# Patient Record
Sex: Male | Born: 1940 | Race: White | Hispanic: No | Marital: Married | Smoking: Former smoker
Health system: Southern US, Community
[De-identification: ages and names within clinical notes are randomized; demographics above are authoritative.]

## PROBLEM LIST (undated history)

## (undated) DIAGNOSIS — K219 Gastro-esophageal reflux disease without esophagitis: Secondary | ICD-10-CM

## (undated) DIAGNOSIS — C44629 Squamous cell carcinoma of skin of left upper limb, including shoulder: Secondary | ICD-10-CM

## (undated) DIAGNOSIS — E785 Hyperlipidemia, unspecified: Secondary | ICD-10-CM

## (undated) DIAGNOSIS — E119 Type 2 diabetes mellitus without complications: Secondary | ICD-10-CM

## (undated) DIAGNOSIS — R945 Abnormal results of liver function studies: Secondary | ICD-10-CM

## (undated) DIAGNOSIS — I10 Essential (primary) hypertension: Secondary | ICD-10-CM

## (undated) DIAGNOSIS — I219 Acute myocardial infarction, unspecified: Secondary | ICD-10-CM

## (undated) DIAGNOSIS — M1990 Unspecified osteoarthritis, unspecified site: Secondary | ICD-10-CM

## (undated) DIAGNOSIS — I2510 Atherosclerotic heart disease of native coronary artery without angina pectoris: Secondary | ICD-10-CM

## (undated) HISTORY — DX: Squamous cell carcinoma of skin of left upper limb, including shoulder: C44.629

## (undated) HISTORY — DX: Abnormal results of liver function studies: R94.5

## (undated) HISTORY — PX: COLONOSCOPY: SHX174

## (undated) HISTORY — PX: TIBIA FRACTURE SURGERY: SHX806

## (undated) HISTORY — PX: ANKLE SURGERY: SHX546

## (undated) HISTORY — DX: Gastro-esophageal reflux disease without esophagitis: K21.9

## (undated) HISTORY — DX: Atherosclerotic heart disease of native coronary artery without angina pectoris: I25.10

---

## 1995-11-02 HISTORY — PX: CORONARY ARTERY BYPASS GRAFT: SHX141

## 2002-01-30 ENCOUNTER — Ambulatory Visit (HOSPITAL_COMMUNITY): Admission: RE | Admit: 2002-01-30 | Discharge: 2002-01-30 | Payer: Self-pay | Admitting: Cardiovascular Disease

## 2009-02-17 ENCOUNTER — Encounter: Payer: Self-pay | Admitting: Cardiovascular Disease

## 2009-02-17 ENCOUNTER — Ambulatory Visit: Payer: Self-pay | Admitting: Cardiovascular Disease

## 2009-02-17 DIAGNOSIS — E119 Type 2 diabetes mellitus without complications: Secondary | ICD-10-CM | POA: Insufficient documentation

## 2009-02-17 DIAGNOSIS — Z951 Presence of aortocoronary bypass graft: Secondary | ICD-10-CM | POA: Insufficient documentation

## 2009-02-17 DIAGNOSIS — I119 Hypertensive heart disease without heart failure: Secondary | ICD-10-CM | POA: Insufficient documentation

## 2009-02-17 DIAGNOSIS — R0602 Shortness of breath: Secondary | ICD-10-CM | POA: Insufficient documentation

## 2009-02-17 DIAGNOSIS — E782 Mixed hyperlipidemia: Secondary | ICD-10-CM | POA: Insufficient documentation

## 2009-02-17 DIAGNOSIS — G609 Hereditary and idiopathic neuropathy, unspecified: Secondary | ICD-10-CM | POA: Insufficient documentation

## 2009-02-17 DIAGNOSIS — I10 Essential (primary) hypertension: Secondary | ICD-10-CM | POA: Insufficient documentation

## 2009-02-24 ENCOUNTER — Telehealth (INDEPENDENT_AMBULATORY_CARE_PROVIDER_SITE_OTHER): Payer: Self-pay | Admitting: *Deleted

## 2009-02-25 ENCOUNTER — Encounter: Payer: Self-pay | Admitting: Cardiovascular Disease

## 2009-02-25 ENCOUNTER — Ambulatory Visit: Payer: Self-pay

## 2009-03-06 ENCOUNTER — Telehealth: Payer: Self-pay | Admitting: Cardiovascular Disease

## 2009-12-31 ENCOUNTER — Encounter (INDEPENDENT_AMBULATORY_CARE_PROVIDER_SITE_OTHER): Payer: Self-pay | Admitting: *Deleted

## 2010-11-23 ENCOUNTER — Encounter: Payer: Self-pay | Admitting: Cardiovascular Disease

## 2010-12-01 NOTE — Letter (Signed)
Summary: Appointment - Reminder 2  [employer], Main Office  [address]   [place], [place] [address]   Phone: [phone]  Fax: [phone]     December 31, 2009 MRN: [id number]   [name] [address] [facility] [address] [place], [place]  [address]   Dear Andre Shaffer,  Our records indicate that it is time to schedule a follow-up appointment with Dr. Eden Emms. It is very important that we reach you to schedule this appointment. We look forward to participating in your health care needs. Please contact us at the number listed above at your earliest convenience to schedule your appointment.  If you are unable to make an appointment at this time, give [place] a call so we can update our records.     Sincerely,   Migdalia Dk [facility] Scheduling Team

## 2012-10-13 ENCOUNTER — Encounter (HOSPITAL_COMMUNITY): Payer: Self-pay | Admitting: Emergency Medicine

## 2012-10-13 ENCOUNTER — Observation Stay (HOSPITAL_COMMUNITY)
Admission: EM | Admit: 2012-10-13 | Discharge: 2012-10-15 | Disposition: A | Discharge status: Home / Self Care | Payer: Medicare Other | Attending: Internal Medicine | Admitting: Internal Medicine

## 2012-10-13 DIAGNOSIS — Z951 Presence of aortocoronary bypass graft: Secondary | ICD-10-CM

## 2012-10-13 DIAGNOSIS — K8050 Calculus of bile duct without cholangitis or cholecystitis without obstruction: Secondary | ICD-10-CM

## 2012-10-13 DIAGNOSIS — I119 Hypertensive heart disease without heart failure: Secondary | ICD-10-CM | POA: Diagnosis present

## 2012-10-13 DIAGNOSIS — K8020 Calculus of gallbladder without cholecystitis without obstruction: Principal | ICD-10-CM

## 2012-10-13 DIAGNOSIS — G609 Hereditary and idiopathic neuropathy, unspecified: Secondary | ICD-10-CM

## 2012-10-13 DIAGNOSIS — R0602 Shortness of breath: Secondary | ICD-10-CM

## 2012-10-13 DIAGNOSIS — E119 Type 2 diabetes mellitus without complications: Secondary | ICD-10-CM

## 2012-10-13 DIAGNOSIS — Z79899 Other long term (current) drug therapy: Secondary | ICD-10-CM | POA: Insufficient documentation

## 2012-10-13 DIAGNOSIS — E782 Mixed hyperlipidemia: Secondary | ICD-10-CM

## 2012-10-13 DIAGNOSIS — I10 Essential (primary) hypertension: Secondary | ICD-10-CM

## 2012-10-13 DIAGNOSIS — R7989 Other specified abnormal findings of blood chemistry: Secondary | ICD-10-CM

## 2012-10-13 HISTORY — DX: Type 2 diabetes mellitus without complications: E11.9

## 2012-10-13 HISTORY — DX: Acute myocardial infarction, unspecified: I21.9

## 2012-10-13 HISTORY — DX: Essential (primary) hypertension: I10

## 2012-10-13 LAB — CBC WITH DIFFERENTIAL/PLATELET
Basophils Absolute: 0 10*3/uL (ref 0.0–0.1)
Basophils Relative: 0 % (ref 0–1)
Eosinophils Absolute: 0.1 10*3/uL (ref 0.0–0.7)
Eosinophils Relative: 1 % (ref 0–5)
HCT: 42.4 % (ref 39.0–52.0)
Lymphocytes Relative: 15 % (ref 12–46)
MCHC: 35.6 g/dL (ref 30.0–36.0)
MCV: 89.6 fL (ref 78.0–100.0)
Monocytes Absolute: 0.7 10*3/uL (ref 0.1–1.0)
Platelets: 142 10*3/uL — ABNORMAL LOW (ref 150–400)
RDW: 13.4 % (ref 11.5–15.5)
WBC: 8.5 10*3/uL (ref 4.0–10.5)

## 2012-10-13 LAB — COMPREHENSIVE METABOLIC PANEL
ALT: 237 U/L — ABNORMAL HIGH (ref 0–53)
AST: 195 U/L — ABNORMAL HIGH (ref 0–37)
Albumin: 3.5 g/dL (ref 3.5–5.2)
CO2: 22 mEq/L (ref 19–32)
Calcium: 10.2 mg/dL (ref 8.4–10.5)
Creatinine, Ser: 0.93 mg/dL (ref 0.50–1.35)
GFR calc non Af Amer: 81 mL/min — ABNORMAL LOW (ref >90)
Sodium: 135 mEq/L (ref 135–145)
Total Protein: 7.6 g/dL (ref 6.0–8.3)

## 2012-10-13 LAB — POCT I-STAT TROPONIN I

## 2012-10-13 LAB — GLUCOSE, CAPILLARY: Glucose-Capillary: 187 mg/dL — ABNORMAL HIGH (ref 70–99)

## 2012-10-13 LAB — URINALYSIS, MICROSCOPIC ONLY
Glucose, UA: 250 mg/dL — AB
Ketones, ur: 15 mg/dL — AB
Leukocytes, UA: NEGATIVE
Nitrite: NEGATIVE
Protein, ur: NEGATIVE mg/dL

## 2012-10-13 LAB — LIPASE, BLOOD: Lipase: 17 U/L (ref 11–59)

## 2012-10-13 NOTE — ED Notes (Addendum)
Reports intermittent fever over the last 3 weeks with very mild intermittent abd pain above umbilicus.  Denies pain or any other symptoms at present.  Pt states he had CT scan at [facility] today and was sent to ED for gallstones. Denies nausea and vomiting.

## 2012-10-13 NOTE — ED Notes (Signed)
States that he  Had a sharp pain just above his umbilicus and nausea yesterday. Denies pain currently.

## 2012-10-14 ENCOUNTER — Emergency Department (HOSPITAL_COMMUNITY): Payer: Medicare Other

## 2012-10-14 ENCOUNTER — Encounter (HOSPITAL_COMMUNITY): Payer: Self-pay | Admitting: Internal Medicine

## 2012-10-14 ENCOUNTER — Observation Stay (HOSPITAL_COMMUNITY): Payer: Medicare Other

## 2012-10-14 DIAGNOSIS — R7989 Other specified abnormal findings of blood chemistry: Secondary | ICD-10-CM | POA: Diagnosis present

## 2012-10-14 DIAGNOSIS — K8020 Calculus of gallbladder without cholecystitis without obstruction: Secondary | ICD-10-CM

## 2012-10-14 DIAGNOSIS — I10 Essential (primary) hypertension: Secondary | ICD-10-CM

## 2012-10-14 DIAGNOSIS — R6889 Other general symptoms and signs: Secondary | ICD-10-CM

## 2012-10-14 LAB — GLUCOSE, CAPILLARY
Glucose-Capillary: 209 mg/dL — ABNORMAL HIGH (ref 70–99)
Glucose-Capillary: 215 mg/dL — ABNORMAL HIGH (ref 70–99)

## 2012-10-14 LAB — COMPREHENSIVE METABOLIC PANEL
ALT: 191 U/L — ABNORMAL HIGH (ref 0–53)
AST: 122 U/L — ABNORMAL HIGH (ref 0–37)
Albumin: 3.4 g/dL — ABNORMAL LOW (ref 3.5–5.2)
Alkaline Phosphatase: 447 U/L — ABNORMAL HIGH (ref 39–117)
BUN: 18 mg/dL (ref 6–23)
CO2: 24 mEq/L (ref 19–32)
Calcium: 9.9 mg/dL (ref 8.4–10.5)
Chloride: 101 mEq/L (ref 96–112)
Creatinine, Ser: 0.86 mg/dL (ref 0.50–1.35)
GFR calc Af Amer: >90 mL/min (ref >90)
GFR calc non Af Amer: 84 mL/min — ABNORMAL LOW (ref >90)
Glucose, Bld: 162 mg/dL — ABNORMAL HIGH (ref 70–99)
Potassium: 5 mEq/L (ref 3.5–5.1)
Sodium: 135 mEq/L (ref 135–145)
Total Bilirubin: 1.9 mg/dL — ABNORMAL HIGH (ref 0.3–1.2)
Total Protein: 7.5 g/dL (ref 6.0–8.3)

## 2012-10-14 LAB — HEPATIC FUNCTION PANEL
Albumin: 3.1 g/dL — ABNORMAL LOW (ref 3.5–5.2)
Alkaline Phosphatase: 430 U/L — ABNORMAL HIGH (ref 39–117)
Total Protein: 6.8 g/dL (ref 6.0–8.3)

## 2012-10-14 MED ORDER — DEXTROSE-NACL 5-0.9 % IV SOLN
INTRAVENOUS | Status: DC
  Administered 2012-10-14: 04:00:00 via INTRAVENOUS

## 2012-10-14 MED ORDER — ENOXAPARIN SODIUM 40 MG/0.4ML ~~LOC~~ SOLN
40.0000 mg | SUBCUTANEOUS | Status: DC
  Administered 2012-10-14 – 2012-10-15 (×2): 40 mg via SUBCUTANEOUS
  Filled 2012-10-14 (×2): qty 0.4

## 2012-10-14 MED ORDER — ATORVASTATIN CALCIUM 40 MG PO TABS
40.0000 mg | ORAL_TABLET | Freq: Every day | ORAL | Status: DC
  Filled 2012-10-14: qty 1

## 2012-10-14 MED ORDER — LOSARTAN POTASSIUM 25 MG PO TABS
25.0000 mg | ORAL_TABLET | Freq: Every day | ORAL | Status: DC
  Administered 2012-10-14 – 2012-10-15 (×2): 25 mg via ORAL
  Filled 2012-10-14 (×2): qty 1

## 2012-10-14 MED ORDER — ASPIRIN 325 MG PO TABS
325.0000 mg | ORAL_TABLET | Freq: Every day | ORAL | Status: DC
  Administered 2012-10-14 – 2012-10-15 (×2): 325 mg via ORAL
  Filled 2012-10-14 (×2): qty 1

## 2012-10-14 MED ORDER — INSULIN ASPART 100 UNIT/ML ~~LOC~~ SOLN
0.0000 [IU] | SUBCUTANEOUS | Status: DC
  Administered 2012-10-14 (×2): 5 [IU] via SUBCUTANEOUS
  Administered 2012-10-14: 3 [IU] via SUBCUTANEOUS

## 2012-10-14 MED ORDER — GABAPENTIN 300 MG PO CAPS
300.0000 mg | ORAL_CAPSULE | Freq: Every day | ORAL | Status: DC
  Administered 2012-10-14: 300 mg via ORAL
  Filled 2012-10-14 (×2): qty 1

## 2012-10-14 MED ORDER — GLIMEPIRIDE 4 MG PO TABS
4.0000 mg | ORAL_TABLET | Freq: Two times a day (BID) | ORAL | Status: DC
  Administered 2012-10-14 – 2012-10-15 (×2): 4 mg via ORAL
  Filled 2012-10-14 (×5): qty 1

## 2012-10-14 MED ORDER — GADOBENATE DIMEGLUMINE 529 MG/ML IV SOLN
20.0000 mL | Freq: Once | INTRAVENOUS | Status: AC | PRN
  Administered 2012-10-14: 20 mL via INTRAVENOUS

## 2012-10-14 MED ORDER — ONDANSETRON HCL 4 MG/2ML IJ SOLN: 4.0000 mg | Freq: Four times a day (QID) | INTRAMUSCULAR | Status: DC | PRN

## 2012-10-14 MED ORDER — ONDANSETRON HCL 4 MG PO TABS: 4.0000 mg | ORAL_TABLET | Freq: Four times a day (QID) | ORAL | Status: DC | PRN

## 2012-10-14 MED ORDER — METOPROLOL SUCCINATE ER 25 MG PO TB24
25.0000 mg | ORAL_TABLET | Freq: Every day | ORAL | Status: DC
  Administered 2012-10-14 – 2012-10-15 (×2): 25 mg via ORAL
  Filled 2012-10-14 (×2): qty 1

## 2012-10-14 MED ORDER — HYDROMORPHONE HCL PF 1 MG/ML IJ SOLN: 1.0000 mg | INTRAMUSCULAR | Status: DC | PRN

## 2012-10-14 MED ORDER — DEXTROSE-NACL 5-0.9 % IV SOLN
INTRAVENOUS | Status: DC
  Administered 2012-10-14: 10:00:00 via INTRAVENOUS

## 2012-10-14 MED ORDER — INSULIN ASPART 100 UNIT/ML ~~LOC~~ SOLN
0.0000 [IU] | Freq: Three times a day (TID) | SUBCUTANEOUS | Status: DC
  Administered 2012-10-14: 2 [IU] via SUBCUTANEOUS
  Administered 2012-10-15 (×2): 5 [IU] via SUBCUTANEOUS

## 2012-10-14 NOTE — Progress Notes (Signed)
UR completed 

## 2012-10-14 NOTE — Progress Notes (Addendum)
Patient admitted early this AM by Dr. Conley Rolls.  Await GI to see and possible D/C this PM--- requesting food.  U/S show gallstones only. Odd story- went to PA in [place] who thought there may be something wrong with appendix- (no symptoms, no pain, no N/V) sent for CT scan and then sent here for ?CBD stone and ?blockage in small intestines Marlin Canary DO

## 2012-10-14 NOTE — ED Notes (Signed)
Patient transported to Ultrasound 

## 2012-10-14 NOTE — H&P (Signed)
[facility] History and Physical  Andre Shaffer VHQ:[id number] DOB: 01/12/1941    PCP:   Physician in [place].  Chief Complaint: Advised to come to ER because of abnormal CT scan.  HPI: Andre Shaffer is an 71 y.o. male with hx of DM, CAD s/p CABG, HTN, who had elevated LFTs but no abdominal pain nor nausea, vomiting.  He was able to eat breakfast without vomiting.  He has an outside CT scan of his abdominal, which reportedly showed cholelithiasis, but no definite choledocholithiasis.  His PCP spoke with GI and recommended him to report to [facility] emergency room, but patient and his family preferred to come to [facility] ER.  Further evaluation in the ER showed LFTs in the 200's with alk phosphatase in the 400's, with normal lipase.  He is totally asymptomatic.  EDP spoke with Dr Madilyn Fireman of GI, and he was given opportunity for outpt follow up, but ended up preferring to be admitted for OBS.  GI will see him in the morning.  He has never had any "gallbladder attack" before.  Rewiew of Systems:  Constitutional: Negative for malaise, fever and chills. No significant weight loss or weight gain Eyes: Negative for eye pain, redness and discharge, diplopia, visual changes, or flashes of light. ENMT: Negative for ear pain, hoarseness, nasal congestion, sinus pressure and sore throat. No headaches; tinnitus, drooling, or problem swallowing. Cardiovascular: Negative for chest pain, palpitations, diaphoresis, dyspnea and peripheral edema. ; No orthopnea, PND Respiratory: Negative for cough, hemoptysis, wheezing and stridor. No pleuritic chestpain. Gastrointestinal: Negative for nausea, vomiting, diarrhea, constipation, abdominal pain, melena, blood in stool, hematemesis, jaundice and rectal bleeding.    Genitourinary: Negative for frequency, dysuria, incontinence,flank pain and hematuria; Musculoskeletal: Negative for back pain and neck pain. Negative for swelling and trauma.;  Skin: . Negative for pruritus,  rash, abrasions, bruising and skin lesion.; ulcerations Neuro: Negative for headache, lightheadedness and neck stiffness. Negative for weakness, altered level of consciousness , altered mental status, extremity weakness, burning feet, involuntary movement, seizure and syncope.  Psych: negative for anxiety, depression, insomnia, tearfulness, panic attacks, hallucinations, paranoia, suicidal or homicidal ideation    Past Medical History  Diagnosis Date  . Diabetes mellitus without complication   . MI (myocardial infarction)   . Hypertension     Past Surgical History  Procedure Date  . Coronary artery bypass graft   . Tibia fracture surgery     Medications:  HOME MEDS: Prior to Admission medications   Medication Sig Start Date End Date Taking? Authorizing Provider  aspirin 325 MG tablet Take 325 mg by mouth daily.   Yes Historical Provider, MD  atorvastatin (LIPITOR) 40 MG tablet Take 40 mg by mouth daily.   Yes Historical Provider, MD  gabapentin (NEURONTIN) 300 MG capsule Take 300 mg by mouth at bedtime.   Yes Historical Provider, MD  glimepiride (AMARYL) 4 MG tablet Take 4 mg by mouth 2 (two) times daily.   Yes Historical Provider, MD  Insulin Lispro Prot & Lispro (HUMALOG MIX 75/25 [place]) Inject 15-34 Units into the skin 2 (two) times daily. Uses 34 units in the morning and 15 units before supper   Yes Historical Provider, MD  losartan (COZAAR) 25 MG tablet Take 25 mg by mouth daily.   Yes Historical Provider, MD  metFORMIN (GLUCOPHAGE) 1000 MG tablet Take 1,000 mg by mouth 2 (two) times daily with a meal.   Yes Historical Provider, MD  metoprolol succinate (TOPROL-XL) 25 MG 24 hr tablet Take 25 mg by  mouth daily.   Yes Historical Provider, MD     Allergies:  Allergies  Allergen Reactions  . Nitroglycerin Other (See Comments)    Blood pressure 'bottomed out'  . Tetanus Toxoid Other (See Comments)    Severe flu symptoms fever and chills  . Penicillins Rash    Social History:    reports that he has quit smoking. He does not have any smokeless tobacco history on file. He reports that he does not drink alcohol or use illicit drugs.  Family History: No family history on file.   Physical Exam: Filed Vitals:   10/13/12 1936 10/13/12 2330 10/14/12 0030 10/14/12 0115  BP: 148/72 123/62 132/68 138/73  Pulse: 80 79 80 81  Temp: 98.4 F (36.9 C)     TempSrc: Oral     Resp: 16 19 21 15   SpO2: 97% 95% 97% 96%   Blood pressure 138/73, pulse 81, temperature 98.4 F (36.9 C), temperature source Oral, resp. rate 15, SpO2 96.00%.  GEN:  Pleasant  patient lying in the stretcher in no acute distress; cooperative with exam. PSYCH:  alert and oriented x4; does not appear anxious or depressed; affect is appropriate. HEENT: Mucous membranes pink and anicteric; PERRLA; EOM intact; no cervical lymphadenopathy nor thyromegaly or carotid bruit; no JVD; There were no stridor. Neck is very supple. Breasts:: Not examined CHEST WALL: No tenderness CHEST: Normal respiration, clear to auscultation bilaterally.  HEART: Regular rate and rhythm.  There are no murmur, rub, or gallops.   BACK: No kyphosis or scoliosis; no CVA tenderness ABDOMEN: soft and non-tender; no masses, no organomegaly, normal abdominal bowel sounds; no pannus; no intertriginous candida. There is no rebound and no distention. Rectal Exam: Not done EXTREMITIES: No bone or joint deformity; age-appropriate arthropathy of the hands and knees; no edema; no ulcerations.  There is no calf tenderness. Genitalia: not examined PULSES: 2+ and symmetric SKIN: Normal hydration no rash or ulceration CNS: Cranial nerves 2-12 grossly intact no focal lateralizing neurologic deficit.  Speech is fluent; uvula elevated with phonation, facial symmetry and tongue midline. DTR are normal bilaterally, cerebella exam is intact, barbinski is negative and strengths are equaled bilaterally.  No sensory loss.   Labs on Admission:  Basic  Metabolic Panel:  Lab 10/13/12 1478  NA 135  K 4.4  CL 99  CO2 22  GLUCOSE 177*  BUN 17  CREATININE 0.93  CALCIUM 10.2  MG --  PHOS --   Liver Function Tests:  Lab 10/13/12 1958  AST 195*  ALT 237*  ALKPHOS 472*  BILITOT 3.8*  PROT 7.6  ALBUMIN 3.5    Lab 10/13/12 1958  LIPASE 17  AMYLASE --   No results found for this basename: AMMONIA:5 in the last 168 hours CBC:  Lab 10/13/12 1958  WBC 8.5  NEUTROABS 6.4  HGB 15.1  HCT 42.4  MCV 89.6  PLT 142*   Cardiac Enzymes: No results found for this basename: CKTOTAL:5,CKMB:5,CKMBINDEX:5,TROPONINI:5 in the last 168 hours  CBG:  Lab 10/13/12 1941  GLUCAP 187*     Radiological Exams on Admission: No results found.   Assessment/Plan Present on Admission:  . Elevated LFTs . Mixed hyperlipidemia . Essential hypertension, benign . AODM . PERIPHERAL NEUROPATHY  PLAN:  I suspect he had choledocholithiasis, but it is possible he has passed the stone.  He does have cholelithiasis.  The LFTs pattern is consistent with obtructive biliary disease, with elevated alkaline phosphatase as well.  He has no evidence of infection nor  pancreatitis.  Will admit him for NPO and IVF.  Will proceed with abdominal US.  I will repeat LFTs, and check hapatitis profile, along with holding his statin as well.  GI will make further recommendation, but if his LFTs normalizes, and he is not symptomatic, Lap CCY is certainly a possibility, but given his known CAD, I am not convinced he will require CCY.  He is stable, full code, and will be admitted to [facility] service.  Thank you for allowing me to partake in the care of your nice patient.     Other plans as per orders.  Code Status: FULL Unk Lightning, MD. [facility] Pager [phone] 7pm to 7am.  10/14/2012, 2:44 AM

## 2012-10-14 NOTE — ED Provider Notes (Addendum)
History    CSN: [id number] Arrival date & time 10/13/12  1925 First MD Initiated Contact with Patient 10/13/12 2343      Chief Complaint  Patient presents with  . Cholelithiasis    HPI Pt has been having episodes of abdominal pain and nausea intermittently over the last few weeks.  The pain has been in the epigastric region.  He has not had any diarrhea or vomiting.  Pt saw his PCP and had a CT scan showing cholelithiasis and possible inflammation around the duodenum and pancreas.  Pt's PCP called a GI doctor and instructed the patient to come to the ED.  Pt currently denies any pain.  Past Medical History  Diagnosis Date  . Diabetes mellitus without complication   . MI (myocardial infarction)   . Hypertension     Past Surgical History  Procedure Date  . Coronary artery bypass graft   . Tibia fracture surgery     No family history on file.  History  Substance Use Topics  . Smoking status: Former Games developer  . Smokeless tobacco: Not on file  . Alcohol Use: No      Review of Systems  All other systems reviewed and are negative.    Allergies  Nitroglycerin; Penicillins; and Tetanus toxoid  Home Medications  No current outpatient prescriptions on file.  BP 123/62  Pulse 79  Temp 98.4 F (36.9 C) (Oral)  Resp 19  SpO2 95%  Physical Exam  Nursing note and vitals reviewed. Constitutional: He appears well-developed and well-nourished. No distress.  HENT:  Head: Normocephalic and atraumatic.  Right Ear: External ear normal.  Left Ear: External ear normal.  Eyes: Conjunctivae normal are normal. Right eye exhibits no discharge. Left eye exhibits no discharge. No scleral icterus.  Neck: Neck supple. No tracheal deviation present.  Cardiovascular: Normal rate, regular rhythm and intact distal pulses.   Pulmonary/Chest: Effort normal and breath sounds normal. No stridor. No respiratory distress. He has no wheezes. He has no rales.  Abdominal: Soft. Bowel sounds are  normal. He exhibits no distension. There is no tenderness. There is no rebound and no guarding.  Musculoskeletal: He exhibits no edema and no tenderness.  Neurological: He is alert. He has normal strength. No sensory deficit. Cranial nerve deficit:  no gross defecits noted. He exhibits normal muscle tone. He displays no seizure activity. Coordination normal.  Skin: Skin is warm and dry. No rash noted.  Psychiatric: He has a normal mood and affect.    ED Course  Procedures (including critical care time) EKG Rate 84 Sinus rhythm with occasional Premature ventricular complexes T wave abnormality, consider inferior ischemia Abnormal ECG No prior EKG Labs Reviewed  CBC WITH DIFFERENTIAL - Abnormal; Notable for the following:    Platelets 142 (*)     All other components within normal limits  COMPREHENSIVE METABOLIC PANEL - Abnormal; Notable for the following:    Glucose, Bld 177 (*)     AST 195 (*)     ALT 237 (*)     Alkaline Phosphatase 472 (*)     Total Bilirubin 3.8 (*)     GFR calc non Af Amer 81 (*)     All other components within normal limits  URINALYSIS, MICROSCOPIC ONLY - Abnormal; Notable for the following:    Color, Urine AMBER (*)  BIOCHEMICALS MAY BE AFFECTED BY COLOR   Glucose, UA 250 (*)     Bilirubin Urine MODERATE (*)     Ketones, ur 15 (*)  All other components within normal limits  GLUCOSE, CAPILLARY - Abnormal; Notable for the following:    Glucose-Capillary 187 (*)     All other components within normal limits  LIPASE, BLOOD  POCT I-STAT TROPONIN I   No results found.   1. Cholelithiasis   2. Hyperbilirubinemia       MDM  CT [facility] was reviewed.  Pt has multiple gallstones on CT.  Discussed case with Dr Madilyn Fireman GI.  Pt should have further evaluation, ie possible MRCP, [place].  Could do as an outpatient versus admission for observation over the weekend to expedite the process.  Discussed with family and patient.  Requests  hospitalization. Will consult with medicine for admission.          Celene Kras, MD 10/14/12 1914  Celene Kras, MD 10/14/12 [phone]

## 2012-10-15 DIAGNOSIS — E119 Type 2 diabetes mellitus without complications: Secondary | ICD-10-CM

## 2012-10-15 DIAGNOSIS — R0602 Shortness of breath: Secondary | ICD-10-CM

## 2012-10-15 LAB — GLUCOSE, CAPILLARY
Glucose-Capillary: 209 mg/dL — ABNORMAL HIGH (ref 70–99)
Glucose-Capillary: 234 mg/dL — ABNORMAL HIGH (ref 70–99)

## 2012-10-15 NOTE — Discharge Summary (Signed)
Physician Discharge Summary  Andre Shaffer ZOX:[id number] DOB: 02-28-1941 DOA: 10/13/2012  PCP: Blane Ohara, MD  Admit date: 10/13/2012 Discharge date: 10/15/2012  Time spent: 45 minutes  Recommendations for Outpatient Follow-up:  1. CMP on wed- hold statin until LE back to normal 2. Will need to schedule follow up with Dr. Dwain Sarna for cholecystectomy 3.   Discharge Diagnoses:  Principal Problem:  *Cholelithiasis Active Problems:  AODM  Mixed hyperlipidemia  PERIPHERAL NEUROPATHY  Essential hypertension, benign  Postsurgical aortocoronary bypass status  Elevated LFTs   Discharge Condition: improved  Diet recommendation: cardiac/diabetic  Filed Weights   10/14/12 0330 10/15/12 0500  Weight: 112.22 kg (247 lb 6.4 oz) 112.311 kg (247 lb 9.6 oz)    History of present illness:  Andre Shaffer is an 71 y.o. male with hx of DM, CAD s/p CABG, HTN, who had elevated LFTs but no abdominal pain nor nausea, vomiting. He was able to eat breakfast without vomiting. He has an outside CT scan of his abdominal, which reportedly showed cholelithiasis, but no definite choledocholithiasis. His PCP spoke with GI and recommended him to report to [facility] emergency room, but patient and his family preferred to come to [facility] ER. Further evaluation in the ER showed LFTs in the 200's with alk phosphatase in the 400's, with normal lipase. He is totally asymptomatic. EDP spoke with Dr Madilyn Fireman of GI, and he was given opportunity for outpt follow up, but ended up preferring to be admitted for OBS. GI will see him in the morning. He has never had any "gallbladder attack" before   Hospital Course:  Called by Case Manager who says after today, he will no longer meet observation criteria and will need to be d/c'd and have any further work up done as an outpatient.   cholelithiasis with possible CBD stone- MRCP done- ? Non obstructing stone, GI consulted, patient is in no pain, eating fine, plan on outpatient  cholecystectomy as per above conversation with case manager and Dr. Jacky Kindle- risks discussed with patient   Procedures:  MRCP  Consultations:  GI Madilyn Fireman)  Surgery ([place])  Discharge Exam: Filed Vitals:   10/14/12 1025 10/14/12 1445 10/14/12 2100 10/15/12 0500  BP: 134/77 129/65 145/76 129/78  Pulse: 76 68 71 66  Temp:  98.4 F (36.9 C) 98.1 F (36.7 C) 98 F (36.7 C)  TempSrc:  Oral    Resp:  18 18 18   Height:      Weight:    112.311 kg (247 lb 9.6 oz)  SpO2:  95% 95% 94%    General: A+Ox3, NAD Cardiovascular: rrr Respiratory: clear anterior  Discharge Instructions      Discharge Orders    Future Orders Please Complete By Expires   Diet - low sodium heart healthy      Diet Carb Modified      Increase activity slowly      Discharge instructions      Comments:   Hold metformin until 12/16 Hold statin until liver enzymes normalize Call surgeon tomm AM to set up cholecystectomy as outpatient   Call MD for:  severe uncontrolled pain      Call MD for:  temperature >100.4      Call MD for:  persistant nausea and vomiting          Medication List     As of 10/15/2012  5:51 PM    STOP taking these medications         atorvastatin 40 MG tablet   Commonly known  as: LIPITOR      TAKE these medications         aspirin 325 MG tablet   Take 325 mg by mouth daily.      gabapentin 300 MG capsule   Commonly known as: NEURONTIN   Take 300 mg by mouth at bedtime.      glimepiride 4 MG tablet   Commonly known as: AMARYL   Take 4 mg by mouth 2 (two) times daily.      HUMALOG MIX 75/25 [place]   Inject 15-34 Units into the skin 2 (two) times daily. Uses 34 units in the morning and 15 units before supper      losartan 25 MG tablet   Commonly known as: COZAAR   Take 25 mg by mouth daily.      metFORMIN 1000 MG tablet   Commonly known as: GLUCOPHAGE   Take 1,000 mg by mouth 2 (two) times daily with a meal.      metoprolol succinate 25 MG 24 hr tablet    Commonly known as: TOPROL-XL   Take 25 mg by mouth daily.         Follow-up Information    Follow up with [facility], MD. In 1 week.   Contact information:   [address] [place] [address] [phone]       Please follow up. (PCP for CMP on Wednesday)           The results of significant diagnostics from this hospitalization (including imaging, microbiology, ancillary and laboratory) are listed below for reference.    Significant Diagnostic Studies: US Abdomen Complete  10/14/2012   *RADIOLOGY REPORT*  Clinical Data:  Abdominal pain.  Evaluate for cholecystitis.  COMPLETE ABDOMINAL ULTRASOUND  Comparison:  CT abdomen and pelvis 10/13/2012.  Findings:  Gallbladder:  Cholelithiasis with multiple small stones in the dependent portion of the gallbladder.  Mild gallbladder wall thickening without apparent edema or sludge.  The Murphy's sign is negative.  Changes are nonspecific but could be associated with cholecystitis in the appropriate clinical setting.  Common bile duct:  Normal caliber with measured diameter of 4.7 mm.  Liver:  Mild diffuse increased hepatic echotexture suggesting fatty infiltration.  IVC:  Inferior vena cava is not well visualized due to overlying bowel gas.  Pancreas:  Pancreas is not well visualized due to overlying bowel gas.  Spleen:  Spleen length measures 11.2 cm.  Normal parenchymal echotexture.  Right Kidney:  Right kidney measures 12.1 cm length.  No hydronephrosis.  Left Kidney:  Left kidney measures 11.8 cm length.  No hydronephrosis.  Parapelvic cyst in the upper pole region.  Abdominal aorta:  Limited visualization.  No aneurysmal dilatation of visualized portions.  IMPRESSION: Cholelithiasis with nonspecific gallbladder wall thickening. Negative Murphy's sign.  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   Original Report Authenticated By: Burman Nieves, M.D.    Mr 3d Recon At Scanner  10/15/2012  *RADIOLOGY REPORT*  Clinical  Data:  Right upper quadrant pain.  Cholelithiasis and biliary dilatation.  MRI ABDOMEN WITHOUT AND WITH CONTRAST (MRCP)  Technique:  Multiplanar multisequence MR imaging of the abdomen was performed without and with contrast, including heavily T2-weighted images of the biliary and pancreatic ducts.  Three-dimensional MR images were rendered by post processing of the original MR data.  Contrast: 20mL MULTIHANCE GADOBENATE DIMEGLUMINE 529 MG/ML IV SOLN  Comparison:  CT on 10/13/2012  Findings:  Gallstones are seen filling the gallbladder lumen, however there is no  evidence of acute cholecystitis.  There is no evidence of biliary ductal dilatation with common bile duct measuring 5 mm in diameter.  There is a single filling defect within the distal common bile duct which is seen on both axial and coronal plane reconstructions, although there is also some artifact to this region which is most evident on 3-D reconstructions.  A tiny nonobstructing distal common bile duct stone cannot be excluded.  The pancreatic duct is nondilated and there is no evidence of pancreas divisum.  The liver has normal signal intensity and no liver masses are identified.  There is no evidence of pancreatic mass.  There is subtle peripancreatic stranding adjacent to the pancreatic head, and mild focal pancreatitis cannot be excluded.  No evidence of peripancreatic fluid collections.  A left renal cyst is seen but there is no evidence of renal mass or hydronephrosis.  Adrenal glands and spleen are normal appearance. No lymphadenopathy identified.  IMPRESSION:  1.  Cholelithiasis, without evidence of acute cholecystitis. 2.  No evidence of biliary ductal dilatation.  Tiny nonobstructing calculus versus artifact in the distal common bile duct. Correlation with liver function tests is recommended. 3.  Subtle peripancreatic stranding adjacent to the pancreatic head.  Mild focal pancreatitis cannot be excluded.  Suggest correlation with serum amylase  and lipase levels.   Original Report Authenticated By: Myles Rosenthal, M.D.    Mr Abd W/wo Cm/mrcp  10/15/2012  *RADIOLOGY REPORT*  Clinical Data:  Right upper quadrant pain.  Cholelithiasis and biliary dilatation.  MRI ABDOMEN WITHOUT AND WITH CONTRAST (MRCP)  Technique:  Multiplanar multisequence MR imaging of the abdomen was performed without and with contrast, including heavily T2-weighted images of the biliary and pancreatic ducts.  Three-dimensional MR images were rendered by post processing of the original MR data.  Contrast: 20mL MULTIHANCE GADOBENATE DIMEGLUMINE 529 MG/ML IV SOLN  Comparison:  CT on 10/13/2012  Findings:  Gallstones are seen filling the gallbladder lumen, however there is no evidence of acute cholecystitis.  There is no evidence of biliary ductal dilatation with common bile duct measuring 5 mm in diameter.  There is a single filling defect within the distal common bile duct which is seen on both axial and coronal plane reconstructions, although there is also some artifact to this region which is most evident on 3-D reconstructions.  A tiny nonobstructing distal common bile duct stone cannot be excluded.  The pancreatic duct is nondilated and there is no evidence of pancreas divisum.  The liver has normal signal intensity and no liver masses are identified.  There is no evidence of pancreatic mass.  There is subtle peripancreatic stranding adjacent to the pancreatic head, and mild focal pancreatitis cannot be excluded.  No evidence of peripancreatic fluid collections.  A left renal cyst is seen but there is no evidence of renal mass or hydronephrosis.  Adrenal glands and spleen are normal appearance. No lymphadenopathy identified.  IMPRESSION:  1.  Cholelithiasis, without evidence of acute cholecystitis. 2.  No evidence of biliary ductal dilatation.  Tiny nonobstructing calculus versus artifact in the distal common bile duct. Correlation with liver function tests is recommended. 3.  Subtle  peripancreatic stranding adjacent to the pancreatic head.  Mild focal pancreatitis cannot be excluded.  Suggest correlation with serum amylase and lipase levels.   Original Report Authenticated By: Myles Rosenthal, M.D.     Microbiology: No results found for this or any previous visit (from the past 240 hour(s)).   Labs: Basic Metabolic Panel:  Lab  10/14/12 1557 10/13/12 1958  NA 135 135  K 5.0 4.4  CL 101 99  CO2 24 22  GLUCOSE 162* 177*  BUN 18 17  CREATININE 0.86 0.93  CALCIUM 9.9 10.2  MG -- --  PHOS -- --   Liver Function Tests:  Lab 10/14/12 1557 10/13/12 1958  AST 122* 195*  ALT 191* 237*  ALKPHOS 447* 472*  BILITOT 1.9* 3.8*  PROT 7.5 7.6  ALBUMIN 3.4* 3.5    Lab 10/13/12 1958  LIPASE 17  AMYLASE --   No results found for this basename: AMMONIA:5 in the last 168 hours CBC:  Lab 10/13/12 1958  WBC 8.5  NEUTROABS 6.4  HGB 15.1  HCT 42.4  MCV 89.6  PLT 142*   Cardiac Enzymes: No results found for this basename: CKTOTAL:5,CKMB:5,CKMBINDEX:5,TROPONINI:5 in the last 168 hours BNP: BNP (last 3 results) No results found for this basename: PROBNP:3 in the last 8760 hours CBG:  Lab 10/15/12 1144 10/15/12 0735 10/14/12 2041 10/14/12 1656 10/14/12 1135  GLUCAP 234* 209* 215* 150* 215*       Signed:  Aundreya Souffrant  [facility] 10/15/2012, 5:51 PM

## 2012-10-15 NOTE — Consult Note (Signed)
[facility] Consult Note  Referring Provider: No ref. provider found Primary Care Physician:  No primary provider on file. Primary Gastroenterologist:  Dr.  Antony Contras Complaint: Brief abdominal pain and abnormal CT scan HPI: Andre Shaffer is an 71 y.o. white male  2 describes the last 2 weeks some brief periods where he felt febrile and then 3 days ago brief episode of epigastric abdominal pain. Patient's wife at his-colored not looked good and he would see his primary care physician. An abdominal CT scan was ordered apparently for suspicion of appendicitis which showed cholelithiasis but no definite choledocholithiasis. He did have elevated liver function tests with LFTs in the 200s with slightly elevated bilirubin alkaline phosphatase. He had an abdominal ultrasound since he was here which showed 4.7 mm common bile duct with no obvious stones and cholelithiasis. There was also noted to be mild gallbladder wall thickening. An MRCP was performed last night and results are pending. The patient has been given a regular diet today and is asymptomatic.  Past Medical History  Diagnosis Date  . Diabetes mellitus without complication   . MI (myocardial infarction)   . Hypertension     Past Surgical History  Procedure Date  . Coronary artery bypass graft   . Tibia fracture surgery     Medications Prior to Admission  Medication Sig Dispense Refill  . aspirin 325 MG tablet Take 325 mg by mouth daily.      Marland Kitchen atorvastatin (LIPITOR) 40 MG tablet Take 40 mg by mouth daily.      Marland Kitchen gabapentin (NEURONTIN) 300 MG capsule Take 300 mg by mouth at bedtime.      Marland Kitchen glimepiride (AMARYL) 4 MG tablet Take 4 mg by mouth 2 (two) times daily.      . Insulin Lispro Prot & Lispro (HUMALOG MIX 75/25 [place]) Inject 15-34 Units into the skin 2 (two) times daily. Uses 34 units in the morning and 15 units before supper      . losartan (COZAAR) 25 MG tablet Take 25 mg by mouth daily.      . metFORMIN (GLUCOPHAGE) 1000  MG tablet Take 1,000 mg by mouth 2 (two) times daily with a meal.      . metoprolol succinate (TOPROL-XL) 25 MG 24 hr tablet Take 25 mg by mouth daily.        Allergies:  Allergies  Allergen Reactions  . Nitroglycerin Other (See Comments)    Blood pressure 'bottomed out'  . Tetanus Toxoid Other (See Comments)    Severe flu symptoms fever and chills  . Penicillins Rash    No family history on file.  Social History:  reports that he has quit smoking. He does not have any smokeless tobacco history on file. He reports that he does not drink alcohol or use illicit drugs.  Review of Systems: negative except as above. No prior history of liver disease hepatitis or jaundice. No alcohol no family history of liver disease.   Blood pressure 129/78, pulse 66, temperature 98 F (36.7 C), temperature source Oral, resp. rate 18, height 6\' 2"  (1.88 m), weight 112.311 kg (247 lb 9.6 oz), SpO2 94.00%. Head: Normocephalic, without obvious abnormality, atraumatic Neck: no adenopathy, no carotid bruit, no JVD, supple, symmetrical, trachea midline and thyroid not enlarged, symmetric, no tenderness/mass/nodules Resp: clear to auscultation bilaterally Cardio: regular rate and rhythm, S1, S2 normal, no murmur, click, rub or gallop GI: Abdomen soft nondistended with normoactive bowel sounds. No hepatomegaly masses or guarding. Extremities: extremities normal, atraumatic, no  cyanosis or edema  Results for orders placed during the hospital encounter of 10/13/12 (from the past 48 hour(s))  GLUCOSE, CAPILLARY     Status: Abnormal   Collection Time   10/13/12  7:41 PM      Component Value Range Comment   Glucose-Capillary 187 (*) 70 - 99 mg/dL   CBC WITH DIFFERENTIAL     Status: Abnormal   Collection Time   10/13/12  7:58 PM      Component Value Range Comment   WBC 8.5  4.0 - 10.5 K/uL    RBC 4.73  4.22 - 5.81 MIL/uL    Hemoglobin 15.1  13.0 - 17.0 g/dL    HCT 04.5  40.9 - 81.1 %    MCV 89.6  78.0 -  100.0 fL    MCH 31.9  26.0 - 34.0 pg    MCHC 35.6  30.0 - 36.0 g/dL    RDW 91.4  78.2 - 95.6 %    Platelets 142 (*) 150 - 400 K/uL    Neutrophils Relative 76  43 - 77 %    Neutro Abs 6.4  1.7 - 7.7 K/uL    Lymphocytes Relative 15  12 - 46 %    Lymphs Abs 1.3  0.7 - 4.0 K/uL    Monocytes Relative 8  3 - 12 %    Monocytes Absolute 0.7  0.1 - 1.0 K/uL    Eosinophils Relative 1  0 - 5 %    Eosinophils Absolute 0.1  0.0 - 0.7 K/uL    Basophils Relative 0  0 - 1 %    Basophils Absolute 0.0  0.0 - 0.1 K/uL   COMPREHENSIVE METABOLIC PANEL     Status: Abnormal   Collection Time   10/13/12  7:58 PM      Component Value Range Comment   Sodium 135  135 - 145 mEq/L    Potassium 4.4  3.5 - 5.1 mEq/L    Chloride 99  96 - 112 mEq/L    CO2 22  19 - 32 mEq/L    Glucose, Bld 177 (*) 70 - 99 mg/dL    BUN 17  6 - 23 mg/dL    Creatinine, Ser 2.13  0.50 - 1.35 mg/dL    Calcium 08.6  8.4 - 10.5 mg/dL    Total Protein 7.6  6.0 - 8.3 g/dL    Albumin 3.5  3.5 - 5.2 g/dL    AST 578 (*) 0 - 37 U/L    ALT 237 (*) 0 - 53 U/L    Alkaline Phosphatase 472 (*) 39 - 117 U/L    Total Bilirubin 3.8 (*) 0.3 - 1.2 mg/dL    GFR calc non Af Amer 81 (*) >90 mL/min    GFR calc Af Amer >90  >90 mL/min   LIPASE, BLOOD     Status: Normal   Collection Time   10/13/12  7:58 PM      Component Value Range Comment   Lipase 17  11 - 59 U/L   URINALYSIS, MICROSCOPIC ONLY     Status: Abnormal   Collection Time   10/13/12  8:35 PM      Component Value Range Comment   Color, Urine AMBER (*) YELLOW BIOCHEMICALS MAY BE AFFECTED BY COLOR   APPearance CLEAR  CLEAR    Specific Gravity, Urine 1.010  1.005 - 1.030    pH 5.0  5.0 - 8.0    Glucose, UA 250 (*) NEGATIVE mg/dL  Hgb urine dipstick NEGATIVE  NEGATIVE    Bilirubin Urine MODERATE (*) NEGATIVE    Ketones, ur 15 (*) NEGATIVE mg/dL    Protein, ur NEGATIVE  NEGATIVE mg/dL    Urobilinogen, UA 1.0  0.0 - 1.0 mg/dL    Nitrite NEGATIVE  NEGATIVE    Leukocytes, UA NEGATIVE   NEGATIVE    WBC, UA 0-2  <3 WBC/hpf    RBC / HPF 0-2  <3 RBC/hpf    Squamous Epithelial / LPF RARE  RARE   POCT I-STAT TROPONIN I     Status: Normal   Collection Time   10/13/12  8:57 PM      Component Value Range Comment   Troponin i, poc 0.00  0.00 - 0.08 ng/mL    Comment 3            GLUCOSE, CAPILLARY     Status: Abnormal   Collection Time   10/14/12  3:14 AM      Component Value Range Comment   Glucose-Capillary 159 (*) 70 - 99 mg/dL   GLUCOSE, CAPILLARY     Status: Abnormal   Collection Time   10/14/12  7:34 AM      Component Value Range Comment   Glucose-Capillary 209 (*) 70 - 99 mg/dL    Comment 1 Notify RN     GLUCOSE, CAPILLARY     Status: Abnormal   Collection Time   10/14/12 11:35 AM      Component Value Range Comment   Glucose-Capillary 215 (*) 70 - 99 mg/dL    Comment 1 Notify RN     COMPREHENSIVE METABOLIC PANEL     Status: Abnormal   Collection Time   10/14/12  3:57 PM      Component Value Range Comment   Sodium 135  135 - 145 mEq/L    Potassium 5.0  3.5 - 5.1 mEq/L HEMOLYSIS AT THIS LEVEL MAY AFFECT RESULT   Chloride 101  96 - 112 mEq/L    CO2 24  19 - 32 mEq/L    Glucose, Bld 162 (*) 70 - 99 mg/dL    BUN 18  6 - 23 mg/dL    Creatinine, Ser 4.54  0.50 - 1.35 mg/dL    Calcium 9.9  8.4 - 09.8 mg/dL    Total Protein 7.5  6.0 - 8.3 g/dL    Albumin 3.4 (*) 3.5 - 5.2 g/dL    AST 119 (*) 0 - 37 U/L    ALT 191 (*) 0 - 53 U/L    Alkaline Phosphatase 447 (*) 39 - 117 U/L    Total Bilirubin 1.9 (*) 0.3 - 1.2 mg/dL    GFR calc non Af Amer 84 (*) >90 mL/min    GFR calc Af Amer >90  >90 mL/min   GLUCOSE, CAPILLARY     Status: Abnormal   Collection Time   10/14/12  4:56 PM      Component Value Range Comment   Glucose-Capillary 150 (*) 70 - 99 mg/dL    Comment 1 Notify RN     GLUCOSE, CAPILLARY     Status: Abnormal   Collection Time   10/14/12  8:41 PM      Component Value Range Comment   Glucose-Capillary 215 (*) 70 - 99 mg/dL   GLUCOSE, CAPILLARY      Status: Abnormal   Collection Time   10/15/12  7:35 AM      Component Value Range Comment   Glucose-Capillary 209 (*) 70 -  99 mg/dL    US Abdomen Complete  10/14/2012   *RADIOLOGY REPORT*  Clinical Data:  Abdominal pain.  Evaluate for cholecystitis.  COMPLETE ABDOMINAL ULTRASOUND  Comparison:  CT abdomen and pelvis 10/13/2012.  Findings:  Gallbladder:  Cholelithiasis with multiple small stones in the dependent portion of the gallbladder.  Mild gallbladder wall thickening without apparent edema or sludge.  The Murphy's sign is negative.  Changes are nonspecific but could be associated with cholecystitis in the appropriate clinical setting.  Common bile duct:  Normal caliber with measured diameter of 4.7 mm.  Liver:  Mild diffuse increased hepatic echotexture suggesting fatty infiltration.  IVC:  Inferior vena cava is not well visualized due to overlying bowel gas.  Pancreas:  Pancreas is not well visualized due to overlying bowel gas.  Spleen:  Spleen length measures 11.2 cm.  Normal parenchymal echotexture.  Right Kidney:  Right kidney measures 12.1 cm length.  No hydronephrosis.  Left Kidney:  Left kidney measures 11.8 cm length.  No hydronephrosis.  Parapelvic cyst in the upper pole region.  Abdominal aorta:  Limited visualization.  No aneurysmal dilatation of visualized portions.  IMPRESSION: Cholelithiasis with nonspecific gallbladder wall thickening. Negative Murphy's sign.  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   Original Report Authenticated By: Burman Nieves, M.D.     Assessment: Probable biliary colic with documented cholelithiasis and some elevated liver function tests which are improving. No confirmation of choledocholithiasis by imaging studies yet. Await MRCP. Plan:  If MRCP suggested common bile duct stone will probably proceed with ERCP. Is not surgical consult for consideration of cholecystectomy and intraoperative cholangiogram. Mikaiah Stoffer,[place] C 10/15/2012, 8:48 AM

## 2012-10-15 NOTE — Consult Note (Signed)
Reason for Consult:gallstones Referring Physician: Dr. Tyrone Sage Andre Shaffer is an 71 y.o. male.  HPI:  55 yom who has had couple of episodes  where he felt febrile and then 4 days ago brief episode of epigastric abdominal pain. Family thought his color was abnormal.   An abdominal CT scan done at [facility] showed cholelithiasis but no definite choledocholithiasis. He did have elevated liver function tests with LFTs in the 200s with slightly elevated bilirubin alkaline phosphatase. He had an abdominal ultrasound since he was here which showed 4.7 mm common bile duct with no obvious stones and cholelithiasis.  He has had no abdominal pain since the last episode.  He was eating yesterday and has his lunch in front of him today.  He is eating without pain.  His LFTs increased and then now are decreasing.  MRCP does not show anything overly concerning for stone in duct.  Consulted for gb management.     Past Medical History  Diagnosis Date  . Diabetes mellitus without complication   . MI (myocardial infarction)   . Hypertension     Past Surgical History  Procedure Date  . Coronary artery bypass graft   . Tibia fracture surgery     No family history on file.  Social History:  reports that he has quit smoking. He does not have any smokeless tobacco history on file. He reports that he does not drink alcohol or use illicit drugs.  Allergies:  Allergies  Allergen Reactions  . Nitroglycerin Other (See Comments)    Blood pressure 'bottomed out'  . Tetanus Toxoid Other (See Comments)    Severe flu symptoms fever and chills  . Penicillins Rash    Medications: I have reviewed the patient's current medications.  Results for orders placed during the hospital encounter of 10/13/12 (from the past 48 hour(s))  GLUCOSE, CAPILLARY     Status: Abnormal   Collection Time   10/13/12  7:41 PM      Component Value Range Comment   Glucose-Capillary 187 (*) 70 - 99 mg/dL   CBC WITH DIFFERENTIAL     Status:  Abnormal   Collection Time   10/13/12  7:58 PM      Component Value Range Comment   WBC 8.5  4.0 - 10.5 K/uL    RBC 4.73  4.22 - 5.81 MIL/uL    Hemoglobin 15.1  13.0 - 17.0 g/dL    HCT 16.1  09.6 - 04.5 %    MCV 89.6  78.0 - 100.0 fL    MCH 31.9  26.0 - 34.0 pg    MCHC 35.6  30.0 - 36.0 g/dL    RDW 40.9  81.1 - 91.4 %    Platelets 142 (*) 150 - 400 K/uL    Neutrophils Relative 76  43 - 77 %    Neutro Abs 6.4  1.7 - 7.7 K/uL    Lymphocytes Relative 15  12 - 46 %    Lymphs Abs 1.3  0.7 - 4.0 K/uL    Monocytes Relative 8  3 - 12 %    Monocytes Absolute 0.7  0.1 - 1.0 K/uL    Eosinophils Relative 1  0 - 5 %    Eosinophils Absolute 0.1  0.0 - 0.7 K/uL    Basophils Relative 0  0 - 1 %    Basophils Absolute 0.0  0.0 - 0.1 K/uL   COMPREHENSIVE METABOLIC PANEL     Status: Abnormal   Collection Time  10/13/12  7:58 PM      Component Value Range Comment   Sodium 135  135 - 145 mEq/L    Potassium 4.4  3.5 - 5.1 mEq/L    Chloride 99  96 - 112 mEq/L    CO2 22  19 - 32 mEq/L    Glucose, Bld 177 (*) 70 - 99 mg/dL    BUN 17  6 - 23 mg/dL    Creatinine, Ser 2.13  0.50 - 1.35 mg/dL    Calcium 08.6  8.4 - 10.5 mg/dL    Total Protein 7.6  6.0 - 8.3 g/dL    Albumin 3.5  3.5 - 5.2 g/dL    AST 578 (*) 0 - 37 U/L    ALT 237 (*) 0 - 53 U/L    Alkaline Phosphatase 472 (*) 39 - 117 U/L    Total Bilirubin 3.8 (*) 0.3 - 1.2 mg/dL    GFR calc non Af Amer 81 (*) >90 mL/min    GFR calc Af Amer >90  >90 mL/min   LIPASE, BLOOD     Status: Normal   Collection Time   10/13/12  7:58 PM      Component Value Range Comment   Lipase 17  11 - 59 U/L   URINALYSIS, MICROSCOPIC ONLY     Status: Abnormal   Collection Time   10/13/12  8:35 PM      Component Value Range Comment   Color, Urine AMBER (*) YELLOW BIOCHEMICALS MAY BE AFFECTED BY COLOR   APPearance CLEAR  CLEAR    Specific Gravity, Urine 1.010  1.005 - 1.030    pH 5.0  5.0 - 8.0    Glucose, UA 250 (*) NEGATIVE mg/dL    Hgb urine dipstick NEGATIVE   NEGATIVE    Bilirubin Urine MODERATE (*) NEGATIVE    Ketones, ur 15 (*) NEGATIVE mg/dL    Protein, ur NEGATIVE  NEGATIVE mg/dL    Urobilinogen, UA 1.0  0.0 - 1.0 mg/dL    Nitrite NEGATIVE  NEGATIVE    Leukocytes, UA NEGATIVE  NEGATIVE    WBC, UA 0-2  <3 WBC/hpf    RBC / HPF 0-2  <3 RBC/hpf    Squamous Epithelial / LPF RARE  RARE   POCT I-STAT TROPONIN I     Status: Normal   Collection Time   10/13/12  8:57 PM      Component Value Range Comment   Troponin i, poc 0.00  0.00 - 0.08 ng/mL    Comment 3            GLUCOSE, CAPILLARY     Status: Abnormal   Collection Time   10/14/12  3:14 AM      Component Value Range Comment   Glucose-Capillary 159 (*) 70 - 99 mg/dL   GLUCOSE, CAPILLARY     Status: Abnormal   Collection Time   10/14/12  7:34 AM      Component Value Range Comment   Glucose-Capillary 209 (*) 70 - 99 mg/dL    Comment 1 Notify RN     GLUCOSE, CAPILLARY     Status: Abnormal   Collection Time   10/14/12 11:35 AM      Component Value Range Comment   Glucose-Capillary 215 (*) 70 - 99 mg/dL    Comment 1 Notify RN     COMPREHENSIVE METABOLIC PANEL     Status: Abnormal   Collection Time   10/14/12  3:57 PM  Component Value Range Comment   Sodium 135  135 - 145 mEq/L    Potassium 5.0  3.5 - 5.1 mEq/L HEMOLYSIS AT THIS LEVEL MAY AFFECT RESULT   Chloride 101  96 - 112 mEq/L    CO2 24  19 - 32 mEq/L    Glucose, Bld 162 (*) 70 - 99 mg/dL    BUN 18  6 - 23 mg/dL    Creatinine, Ser 4.54  0.50 - 1.35 mg/dL    Calcium 9.9  8.4 - 09.8 mg/dL    Total Protein 7.5  6.0 - 8.3 g/dL    Albumin 3.4 (*) 3.5 - 5.2 g/dL    AST 119 (*) 0 - 37 U/L    ALT 191 (*) 0 - 53 U/L    Alkaline Phosphatase 447 (*) 39 - 117 U/L    Total Bilirubin 1.9 (*) 0.3 - 1.2 mg/dL    GFR calc non Af Amer 84 (*) >90 mL/min    GFR calc Af Amer >90  >90 mL/min   GLUCOSE, CAPILLARY     Status: Abnormal   Collection Time   10/14/12  4:56 PM      Component Value Range Comment   Glucose-Capillary 150 (*)  70 - 99 mg/dL    Comment 1 Notify RN     GLUCOSE, CAPILLARY     Status: Abnormal   Collection Time   10/14/12  8:41 PM      Component Value Range Comment   Glucose-Capillary 215 (*) 70 - 99 mg/dL   GLUCOSE, CAPILLARY     Status: Abnormal   Collection Time   10/15/12  7:35 AM      Component Value Range Comment   Glucose-Capillary 209 (*) 70 - 99 mg/dL   GLUCOSE, CAPILLARY     Status: Abnormal   Collection Time   10/15/12 11:44 AM      Component Value Range Comment   Glucose-Capillary 234 (*) 70 - 99 mg/dL     US Abdomen Complete  10/14/2012   *RADIOLOGY REPORT*  Clinical Data:  Abdominal pain.  Evaluate for cholecystitis.  COMPLETE ABDOMINAL ULTRASOUND  Comparison:  CT abdomen and pelvis 10/13/2012.  Findings:  Gallbladder:  Cholelithiasis with multiple small stones in the dependent portion of the gallbladder.  Mild gallbladder wall thickening without apparent edema or sludge.  The Murphy's sign is negative.  Changes are nonspecific but could be associated with cholecystitis in the appropriate clinical setting.  Common bile duct:  Normal caliber with measured diameter of 4.7 mm.  Liver:  Mild diffuse increased hepatic echotexture suggesting fatty infiltration.  IVC:  Inferior vena cava is not well visualized due to overlying bowel gas.  Pancreas:  Pancreas is not well visualized due to overlying bowel gas.  Spleen:  Spleen length measures 11.2 cm.  Normal parenchymal echotexture.  Right Kidney:  Right kidney measures 12.1 cm length.  No hydronephrosis.  Left Kidney:  Left kidney measures 11.8 cm length.  No hydronephrosis.  Parapelvic cyst in the upper pole region.  Abdominal aorta:  Limited visualization.  No aneurysmal dilatation of visualized portions.  IMPRESSION: Cholelithiasis with nonspecific gallbladder wall thickening. Negative Murphy's sign.  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   Original Report Authenticated By: Burman Nieves, M.D.    Mr 3d Recon At Scanner  10/15/2012   *RADIOLOGY REPORT*  Clinical Data:  Right upper quadrant pain.  Cholelithiasis and biliary dilatation.  MRI ABDOMEN WITHOUT AND WITH CONTRAST (MRCP)  Technique:  Multiplanar multisequence  MR imaging of the abdomen was performed without and with contrast, including heavily T2-weighted images of the biliary and pancreatic ducts.  Three-dimensional MR images were rendered by post processing of the original MR data.  Contrast: 20mL MULTIHANCE GADOBENATE DIMEGLUMINE 529 MG/ML IV SOLN  Comparison:  CT on 10/13/2012  Findings:  Gallstones are seen filling the gallbladder lumen, however there is no evidence of acute cholecystitis.  There is no evidence of biliary ductal dilatation with common bile duct measuring 5 mm in diameter.  There is a single filling defect within the distal common bile duct which is seen on both axial and coronal plane reconstructions, although there is also some artifact to this region which is most evident on 3-D reconstructions.  A tiny nonobstructing distal common bile duct stone cannot be excluded.  The pancreatic duct is nondilated and there is no evidence of pancreas divisum.  The liver has normal signal intensity and no liver masses are identified.  There is no evidence of pancreatic mass.  There is subtle peripancreatic stranding adjacent to the pancreatic head, and mild focal pancreatitis cannot be excluded.  No evidence of peripancreatic fluid collections.  A left renal cyst is seen but there is no evidence of renal mass or hydronephrosis.  Adrenal glands and spleen are normal appearance. No lymphadenopathy identified.  IMPRESSION:  1.  Cholelithiasis, without evidence of acute cholecystitis. 2.  No evidence of biliary ductal dilatation.  Tiny nonobstructing calculus versus artifact in the distal common bile duct. Correlation with liver function tests is recommended. 3.  Subtle peripancreatic stranding adjacent to the pancreatic head.  Mild focal pancreatitis cannot be excluded.  Suggest  correlation with serum amylase and lipase levels.   Original Report Authenticated By: Myles Rosenthal, M.D.    Mr Abd W/wo Cm/mrcp  10/15/2012  *RADIOLOGY REPORT*  Clinical Data:  Right upper quadrant pain.  Cholelithiasis and biliary dilatation.  MRI ABDOMEN WITHOUT AND WITH CONTRAST (MRCP)  Technique:  Multiplanar multisequence MR imaging of the abdomen was performed without and with contrast, including heavily T2-weighted images of the biliary and pancreatic ducts.  Three-dimensional MR images were rendered by post processing of the original MR data.  Contrast: 20mL MULTIHANCE GADOBENATE DIMEGLUMINE 529 MG/ML IV SOLN  Comparison:  CT on 10/13/2012  Findings:  Gallstones are seen filling the gallbladder lumen, however there is no evidence of acute cholecystitis.  There is no evidence of biliary ductal dilatation with common bile duct measuring 5 mm in diameter.  There is a single filling defect within the distal common bile duct which is seen on both axial and coronal plane reconstructions, although there is also some artifact to this region which is most evident on 3-D reconstructions.  A tiny nonobstructing distal common bile duct stone cannot be excluded.  The pancreatic duct is nondilated and there is no evidence of pancreas divisum.  The liver has normal signal intensity and no liver masses are identified.  There is no evidence of pancreatic mass.  There is subtle peripancreatic stranding adjacent to the pancreatic head, and mild focal pancreatitis cannot be excluded.  No evidence of peripancreatic fluid collections.  A left renal cyst is seen but there is no evidence of renal mass or hydronephrosis.  Adrenal glands and spleen are normal appearance. No lymphadenopathy identified.  IMPRESSION:  1.  Cholelithiasis, without evidence of acute cholecystitis. 2.  No evidence of biliary ductal dilatation.  Tiny nonobstructing calculus versus artifact in the distal common bile duct. Correlation with liver function  tests is recommended. 3.  Subtle peripancreatic stranding adjacent to the pancreatic head.  Mild focal pancreatitis cannot be excluded.  Suggest correlation with serum amylase and lipase levels.   Original Report Authenticated By: Myles Rosenthal, M.D.     Review of Systems  Constitutional: Positive for fever and chills. Negative for weight loss.  Respiratory: Negative for shortness of breath.   Cardiovascular: Negative for chest pain and palpitations.  Gastrointestinal: Positive for abdominal pain. Negative for nausea, vomiting, diarrhea and constipation.  Genitourinary: Negative for dysuria.   Blood pressure 129/78, pulse 66, temperature 98 F (36.7 C), temperature source Oral, resp. rate 18, height 6\' 2"  (1.88 m), weight 247 lb 9.6 oz (112.311 kg), SpO2 94.00%. Physical Exam  Vitals reviewed. Constitutional: He appears well-developed and well-nourished.  Eyes: No scleral icterus.  Cardiovascular: Normal rate, regular rhythm and normal heart sounds.   Respiratory: Effort normal and breath sounds normal. He has no wheezes. He has no rales.  GI: Soft. Bowel sounds are normal. He exhibits no distension. There is no tenderness.    Assessment/Plan: Likely choledocholithiasis  By history and films it appears that he passed a stone.  I recommended lap chole at this time as enzymes getting better with cholangiogram.  We discussed risks associated with it.  Would have cards see prior and we will plan on doing tomorrow if all complete and lfts are improving.   Monta Police 10/15/2012, 12:39 PM

## 2012-10-16 ENCOUNTER — Telehealth (INDEPENDENT_AMBULATORY_CARE_PROVIDER_SITE_OTHER): Payer: Self-pay | Admitting: General Surgery

## 2012-10-16 NOTE — Telephone Encounter (Signed)
LMOM letting pt know that he has an appt to see Dr. Derrell Lolling 12/17 at 3:45.

## 2012-10-17 ENCOUNTER — Ambulatory Visit (INDEPENDENT_AMBULATORY_CARE_PROVIDER_SITE_OTHER): Payer: Medicare Other | Admitting: General Surgery

## 2012-10-17 ENCOUNTER — Encounter (INDEPENDENT_AMBULATORY_CARE_PROVIDER_SITE_OTHER): Payer: Self-pay | Admitting: General Surgery

## 2012-10-17 VITALS — BP 125/77 | HR 72 | Temp 97.6°F | Resp 18 | Ht 74.0 in | Wt 253.6 lb

## 2012-10-17 DIAGNOSIS — K8020 Calculus of gallbladder without cholecystitis without obstruction: Secondary | ICD-10-CM

## 2012-10-17 NOTE — Progress Notes (Signed)
Subjective:     Patient ID: Andre Shaffer, male   DOB: 07/05/1941, 71 y.o.   MRN: [id number]  HPI The patient is a 71-year-old male who was recently admitted to the hospital for epigastric pain. Patient was studied and found to have gallstones as well as elevated LFTs. Patient likely passing a gallstone.   The patient also had an MRCP which was clear. His LFTs declined while in the hospital.  The patient has not had any subsequent pain since discharge. Review of Systems  Constitutional: Negative.   HENT: Negative.   Respiratory: Negative.   Cardiovascular: Negative.   Gastrointestinal: Negative.   Musculoskeletal: Negative.        Objective:   Physical Exam  Constitutional: He is oriented to person, place, and time. He appears well-developed and well-nourished.  HENT:  Head: Normocephalic and atraumatic.  Eyes: Conjunctivae normal are normal. Pupils are equal, round, and reactive to light.  Neck: Neck supple.  Cardiovascular: Normal rate and normal heart sounds.   Pulmonary/Chest: Effort normal and breath sounds normal.  Abdominal: Soft.  Musculoskeletal: Normal range of motion.  Neurological: He is alert and oriented to person, place, and time.       Assessment:     71-year-old male with gallstones within the gallbladder, possible choledocholithiasis.    Plan:     1. we will proceed operative for laparoscopic cholecystectomy with intraoperative cholangiogram. 2.All risks and benefits were discussed with the patient, to generally include infection, bleeding, damage to surrounding structures, and recurrence. Alternatives were offered and described.  All questions were answered and the patient voiced understanding of the procedure and wishes to proceed at this point.       

## 2012-10-18 ENCOUNTER — Telehealth (INDEPENDENT_AMBULATORY_CARE_PROVIDER_SITE_OTHER): Payer: Self-pay

## 2012-10-18 ENCOUNTER — Ambulatory Visit (INDEPENDENT_AMBULATORY_CARE_PROVIDER_SITE_OTHER): Payer: Medicare Other | Admitting: Cardiology

## 2012-10-18 ENCOUNTER — Encounter: Payer: Self-pay | Admitting: Cardiology

## 2012-10-18 ENCOUNTER — Encounter (HOSPITAL_BASED_OUTPATIENT_CLINIC_OR_DEPARTMENT_OTHER): Payer: Self-pay | Admitting: *Deleted

## 2012-10-18 VITALS — BP 130/80 | HR 71 | Resp 18 | Ht 74.0 in | Wt 252.8 lb

## 2012-10-18 DIAGNOSIS — I259 Chronic ischemic heart disease, unspecified: Secondary | ICD-10-CM

## 2012-10-18 DIAGNOSIS — Z01818 Encounter for other preprocedural examination: Secondary | ICD-10-CM

## 2012-10-18 DIAGNOSIS — K8020 Calculus of gallbladder without cholecystitis without obstruction: Secondary | ICD-10-CM

## 2012-10-18 DIAGNOSIS — E119 Type 2 diabetes mellitus without complications: Secondary | ICD-10-CM

## 2012-10-18 NOTE — Progress Notes (Signed)
Reviewed case with dr crews-pt has cardiac hx-not seen dr Eden Emms since 1/12-anesthesia says he has to have cardiac clearance. Had stress test 2010-was to have repeat echo last year-never had.

## 2012-10-18 NOTE — Progress Notes (Signed)
Andre Shaffer Date of Birth:  1941-01-15 [facility] [address] [place], [place]  [address] [phone]         Fax   [phone]  History of Present Illness: This pleasant 71 year old gentleman is seen by me for the first time today.  He has a cardiac patient of Dr. Eden Emms but has not been seen in his office since 2010.  The patient has known coronary artery disease.  He underwent coronary artery bypass graft surgery on 05/18/96 by Dr. Tyrone Sage.  He has a history of an old inferior wall myocardial infarction.  He is in an adult onset diabetic.  He is followed medically by Dr. Mickey Farber in [facility].  He has been doing well from the cardiac standpoint.  He denies any chest pain or shortness of breath.  He has not been having any dizziness or syncope.  He has not had any racing of his heart or palpitations.  He recently had some gastrointestinal symptoms and was found to have cholelithiasis.  He is scheduled for cholecystectomy tomorrow by Dr. Derrell Lolling.  Current Outpatient Prescriptions  Medication Sig Dispense Refill  . aspirin 325 MG tablet Take 325 mg by mouth daily.      Marland Kitchen atorvastatin (LIPITOR) 40 MG tablet Take 40 mg by mouth every evening.      . gabapentin (NEURONTIN) 300 MG capsule Take 300 mg by mouth at bedtime.      Marland Kitchen glimepiride (AMARYL) 4 MG tablet Take 4 mg by mouth 2 (two) times daily.      . Insulin Lispro Prot & Lispro (HUMALOG MIX 75/25 [place]) Inject 15-34 Units into the skin 2 (two) times daily. Uses 34 units in the morning and 15 units before supper      . losartan (COZAAR) 25 MG tablet Take 25 mg by mouth daily.      . metFORMIN (GLUCOPHAGE) 1000 MG tablet Take 1,000 mg by mouth 2 (two) times daily with a meal.      . metoprolol succinate (TOPROL-XL) 25 MG 24 hr tablet Take 25 mg by mouth daily.        Allergies  Allergen Reactions  . Nitroglycerin Other (See Comments)    Blood pressure 'bottomed out'  . Tetanus Toxoid Other (See  Comments)    Severe flu symptoms fever and chills  . Penicillins Rash    Patient Active Problem List  Diagnosis  . AODM  . Mixed hyperlipidemia  . PERIPHERAL NEUROPATHY  . Essential hypertension, benign  . DYSPNEA  . Postsurgical aortocoronary bypass status  . Cholelithiasis  . Elevated LFTs    History  Smoking status  . Former Smoker  . Quit date: 10/19/1975  Smokeless tobacco  . Not on file    History  Alcohol Use No    No family history on file.  Review of Systems: Constitutional: no fever chills diaphoresis or fatigue or change in weight.  Head and neck: no hearing loss, no epistaxis, no photophobia or visual disturbance. Respiratory: No cough, shortness of breath or wheezing. Cardiovascular: No chest pain peripheral edema, palpitations. Gastrointestinal: No abdominal distention, positive for recent abdominal discomfort and cholelithiasis, no change in bowel habits hematochezia or melena. Genitourinary: No dysuria, no frequency, no urgency, no nocturia. Musculoskeletal:No arthralgias, no back pain, no gait disturbance or myalgias. Neurological: No dizziness, no headaches, no numbness, no seizures, no syncope, no weakness, no tremors. Hematologic: No lymphadenopathy, no easy bruising. Psychiatric: No confusion, no hallucinations, no sleep disturbance.  Physical Exam: Filed Vitals:   10/18/12 1226  BP: 130/80  Pulse: 71  Resp:    the general appearance reveals a well-developed well-nourished gentleman in no distress.The head and neck exam reveals pupils equal and reactive.  Extraocular movements are full.  There is no scleral icterus.  The mouth and pharynx are normal.  The neck is supple.  The carotids reveal no bruits.  The jugular venous pressure is normal.  The  thyroid is not enlarged.  There is no lymphadenopathy.  The chest is clear to percussion and auscultation.  There are no rales or rhonchi.  Expansion of the chest is symmetrical.  The precordium is  quiet.  The first heart sound is normal.  The second heart sound is physiologically split.  There is no murmur gallop rub or click.  There is no abnormal lift or heave.  The abdomen is soft and nontender.  The bowel sounds are normal.  The liver and spleen are not enlarged.  There are no abdominal masses.  There are no abdominal bruits.  Extremities reveal good pedal pulses.  There is no phlebitis or edema.  There is no cyanosis or clubbing.  Strength is normal and symmetrical in all extremities.  There is no lateralizing weakness.  There are no sensory deficits.  The skin is warm and dry.  There is no rash.  EKG shows normal sinus rhythm and pattern of an old inferior wall myocardial infarction and nonspecific ST-T wave changes and is unchanged since 10/13/12.   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  He has done well following CABG in 1997 and has had no recurrent angina.  He is not experiencing any palpitations or symptoms of congestive heart failure.  His electrocardiogram is stable.  He is cleared from a cardiac standpoint for cholecystectomy tomorrow morning.

## 2012-10-18 NOTE — Progress Notes (Signed)
Pt was in [facility] with [facility] -had ekg labs- Hx cad-dr nishan-last 1/12-no chest pain many yr-last stress test 2010-low risk

## 2012-10-18 NOTE — Patient Instructions (Addendum)
Your physician recommends that you continue on your current medications as directed. Please refer to the Current Medication list given to you today.  You are ok for surgery from cardiac standpoint

## 2012-10-18 NOTE — Telephone Encounter (Signed)
Andre Shaffer returned my phone call. He is aware of appointment with Dr Patty Sermons @ 11:30 today.

## 2012-10-18 NOTE — Telephone Encounter (Signed)
Called patient regarding cardiac clearance appt I set up today. Please overhead page me if patient calls back.

## 2012-10-19 ENCOUNTER — Encounter (HOSPITAL_BASED_OUTPATIENT_CLINIC_OR_DEPARTMENT_OTHER)
Admission: RE | Disposition: A | Discharge status: Home / Self Care | Payer: Self-pay | Source: Ambulatory Visit | Attending: General Surgery

## 2012-10-19 ENCOUNTER — Encounter (HOSPITAL_BASED_OUTPATIENT_CLINIC_OR_DEPARTMENT_OTHER): Payer: Self-pay | Admitting: Anesthesiology

## 2012-10-19 ENCOUNTER — Ambulatory Visit (HOSPITAL_COMMUNITY): Payer: Medicare Other

## 2012-10-19 ENCOUNTER — Ambulatory Visit (HOSPITAL_BASED_OUTPATIENT_CLINIC_OR_DEPARTMENT_OTHER)
Admission: RE | Admit: 2012-10-19 | Discharge: 2012-10-19 | Disposition: A | Discharge status: Home / Self Care | Payer: Medicare Other | Source: Ambulatory Visit | Attending: General Surgery | Admitting: General Surgery

## 2012-10-19 ENCOUNTER — Ambulatory Visit (HOSPITAL_BASED_OUTPATIENT_CLINIC_OR_DEPARTMENT_OTHER): Payer: Medicare Other | Admitting: Anesthesiology

## 2012-10-19 ENCOUNTER — Telehealth (INDEPENDENT_AMBULATORY_CARE_PROVIDER_SITE_OTHER): Payer: Self-pay

## 2012-10-19 ENCOUNTER — Encounter (HOSPITAL_BASED_OUTPATIENT_CLINIC_OR_DEPARTMENT_OTHER): Payer: Self-pay | Admitting: *Deleted

## 2012-10-19 DIAGNOSIS — I10 Essential (primary) hypertension: Secondary | ICD-10-CM | POA: Insufficient documentation

## 2012-10-19 DIAGNOSIS — K8010 Calculus of gallbladder with chronic cholecystitis without obstruction: Secondary | ICD-10-CM

## 2012-10-19 DIAGNOSIS — K8020 Calculus of gallbladder without cholecystitis without obstruction: Secondary | ICD-10-CM

## 2012-10-19 HISTORY — DX: Unspecified osteoarthritis, unspecified site: M19.90

## 2012-10-19 HISTORY — DX: Hyperlipidemia, unspecified: E78.5

## 2012-10-19 HISTORY — PX: CHOLECYSTECTOMY: SHX55

## 2012-10-19 LAB — GLUCOSE, CAPILLARY
Glucose-Capillary: 263 mg/dL — ABNORMAL HIGH (ref 70–99)
Glucose-Capillary: 294 mg/dL — ABNORMAL HIGH (ref 70–99)

## 2012-10-19 SURGERY — LAPAROSCOPIC CHOLECYSTECTOMY WITH INTRAOPERATIVE CHOLANGIOGRAM
Anesthesia: General | Site: Abdomen | Wound class: Clean Contaminated

## 2012-10-19 SURGERY — LAPAROSCOPIC CHOLECYSTECTOMY WITH INTRAOPERATIVE CHOLANGIOGRAM
Anesthesia: General

## 2012-10-19 MED ORDER — NEOSTIGMINE METHYLSULFATE 1 MG/ML IJ SOLN
INTRAMUSCULAR | Status: DC | PRN
  Administered 2012-10-19: 3 mg via INTRAVENOUS

## 2012-10-19 MED ORDER — ROCURONIUM BROMIDE 100 MG/10ML IV SOLN
INTRAVENOUS | Status: DC | PRN
  Administered 2012-10-19: 50 mg via INTRAVENOUS
  Administered 2012-10-19: 10 mg via INTRAVENOUS

## 2012-10-19 MED ORDER — PHENYLEPHRINE HCL 10 MG/ML IJ SOLN
10.0000 mg | INTRAVENOUS | Status: DC | PRN
  Administered 2012-10-19: 50 ug/min via INTRAVENOUS

## 2012-10-19 MED ORDER — HEMOSTATIC AGENTS (NO CHARGE) OPTIME: TOPICAL | Status: DC | PRN

## 2012-10-19 MED ORDER — OXYCODONE HCL 5 MG PO TABS: 5.0000 mg | ORAL_TABLET | Freq: Once | ORAL | Status: DC | PRN

## 2012-10-19 MED ORDER — IOHEXOL 300 MG/ML  SOLN
INTRAMUSCULAR | Status: DC | PRN
  Administered 2012-10-19: 5 mL

## 2012-10-19 MED ORDER — DEXAMETHASONE SODIUM PHOSPHATE 4 MG/ML IJ SOLN
INTRAMUSCULAR | Status: DC | PRN
  Administered 2012-10-19: 10 mg via INTRAVENOUS

## 2012-10-19 MED ORDER — ONDANSETRON HCL 4 MG/2ML IJ SOLN: 4.0000 mg | Freq: Once | INTRAMUSCULAR | Status: DC | PRN

## 2012-10-19 MED ORDER — LACTATED RINGERS IV SOLN
INTRAVENOUS | Status: DC
  Administered 2012-10-19: 07:00:00 via INTRAVENOUS

## 2012-10-19 MED ORDER — OXYCODONE HCL 5 MG/5ML PO SOLN: 5.0000 mg | Freq: Once | ORAL | Status: DC | PRN

## 2012-10-19 MED ORDER — BUPIVACAINE HCL 0.25 % IJ SOLN
INTRAMUSCULAR | Status: DC | PRN
  Administered 2012-10-19: 12 mL

## 2012-10-19 MED ORDER — HYDROMORPHONE HCL PF 1 MG/ML IJ SOLN
0.2500 mg | INTRAMUSCULAR | Status: DC | PRN
  Administered 2012-10-19 (×2): 0.5 mg via INTRAVENOUS

## 2012-10-19 MED ORDER — ONDANSETRON HCL 4 MG/2ML IJ SOLN
INTRAMUSCULAR | Status: DC | PRN
  Administered 2012-10-19: 4 mg via INTRAVENOUS

## 2012-10-19 MED ORDER — GLYCOPYRROLATE 0.2 MG/ML IJ SOLN
INTRAMUSCULAR | Status: DC | PRN
  Administered 2012-10-19: 0.4 mg via INTRAVENOUS

## 2012-10-19 MED ORDER — PHENYLEPHRINE HCL 10 MG/ML IJ SOLN
INTRAMUSCULAR | Status: DC | PRN
  Administered 2012-10-19: 40 ug via INTRAVENOUS

## 2012-10-19 MED ORDER — FENTANYL CITRATE 0.05 MG/ML IJ SOLN
INTRAMUSCULAR | Status: DC | PRN
  Administered 2012-10-19: 100 ug via INTRAVENOUS

## 2012-10-19 MED ORDER — EPHEDRINE SULFATE 50 MG/ML IJ SOLN
INTRAMUSCULAR | Status: DC | PRN
  Administered 2012-10-19: 15 mg via INTRAVENOUS

## 2012-10-19 MED ORDER — LIDOCAINE HCL (CARDIAC) 20 MG/ML IV SOLN
INTRAVENOUS | Status: DC | PRN
  Administered 2012-10-19: 80 mg via INTRAVENOUS

## 2012-10-19 MED ORDER — PROPOFOL 10 MG/ML IV BOLUS
INTRAVENOUS | Status: DC | PRN
  Administered 2012-10-19: 30 mg via INTRAVENOUS
  Administered 2012-10-19: 200 mg via INTRAVENOUS

## 2012-10-19 MED ORDER — SODIUM CHLORIDE 0.9 % IR SOLN
Status: DC | PRN
  Administered 2012-10-19: 1400 mL

## 2012-10-19 MED ORDER — OXYCODONE-ACETAMINOPHEN 5-325 MG PO TABS: 1.0000 | ORAL_TABLET | ORAL | Status: DC | PRN

## 2012-10-19 MED ORDER — METRONIDAZOLE IN NACL 5-0.79 MG/ML-% IV SOLN
500.0000 mg | INTRAVENOUS | Status: AC
  Administered 2012-10-19: .5 g via INTRAVENOUS

## 2012-10-19 SURGICAL SUPPLY — 42 items
APPLIER CLIP 5 13 M/L LIGAMAX5 (MISCELLANEOUS) ×2
BENZOIN TINCTURE PRP APPL 2/3 (GAUZE/BANDAGES/DRESSINGS) ×2 IMPLANT
CANISTER SUCTION 1200CC (MISCELLANEOUS) ×4 IMPLANT
CANISTER SUCTION 2500CC (MISCELLANEOUS) ×2 IMPLANT
CHLORAPREP W/TINT 26ML (MISCELLANEOUS) ×2 IMPLANT
CLIP APPLIE 5 13 M/L LIGAMAX5 (MISCELLANEOUS) ×1 IMPLANT
CLOTH BEACON ORANGE TIMEOUT ST (SAFETY) ×2 IMPLANT
COVER MAYO STAND STRL (DRAPES) ×2 IMPLANT
COVER SURGICAL LIGHT HANDLE (MISCELLANEOUS) IMPLANT
DEVICE TROCAR PUNCTURE CLOSURE (ENDOMECHANICALS) ×2 IMPLANT
DRAPE C-ARM 42X72 X-RAY (DRAPES) ×2 IMPLANT
DRAPE UTILITY 15X26 W/TAPE STR (DRAPE) ×4 IMPLANT
ELECT REM PT RETURN 9FT ADLT (ELECTROSURGICAL) ×2
ELECTRODE REM PT RTRN 9FT ADLT (ELECTROSURGICAL) ×1 IMPLANT
FRANKLIN ENDOSCOPIC CHOLANGIOGRAPHY SET ×2 IMPLANT
GLOVE BIO SURGEON STRL SZ7 (GLOVE) ×2 IMPLANT
GLOVE BIO SURGEON STRL SZ7.5 (GLOVE) ×2 IMPLANT
GLOVE BIOGEL PI IND STRL 7.0 (GLOVE) ×1 IMPLANT
GLOVE BIOGEL PI INDICATOR 7.0 (GLOVE) ×1
GLOVE ECLIPSE 6.5 STRL STRAW (GLOVE) ×2 IMPLANT
GOWN STRL NON-REIN LRG LVL3 (GOWN DISPOSABLE) ×4 IMPLANT
GOWN STRL REIN XL XLG (GOWN DISPOSABLE) ×2 IMPLANT
HEMOSTAT SNOW SURGICEL 2X4 (HEMOSTASIS) IMPLANT
IV CATH 14GX2 1/4 (CATHETERS) ×2 IMPLANT
NEEDLE INSUFFLATION 14GA 120MM (NEEDLE) ×2 IMPLANT
PACK BASIN DAY SURGERY FS (CUSTOM PROCEDURE TRAY) ×2 IMPLANT
PAD ARMBOARD 7.5X6 YLW CONV (MISCELLANEOUS) ×2 IMPLANT
POUCH SPECIMEN RETRIEVAL 10MM (ENDOMECHANICALS) IMPLANT
SCISSORS LAP 5X35 DISP (ENDOMECHANICALS) ×2 IMPLANT
SET IRRIG TUBING LAPAROSCOPIC (IRRIGATION / IRRIGATOR) ×2 IMPLANT
SLEEVE ENDOPATH XCEL 5M (ENDOMECHANICALS) ×4 IMPLANT
SPECIMEN JAR SMALL (MISCELLANEOUS) IMPLANT
SPONGE GAUZE 2X2 12PLY UNSTER (GAUZE/BANDAGES/DRESSINGS) ×2 IMPLANT
SPONGE GAUZE 2X2 8PLY STRL LF (GAUZE/BANDAGES/DRESSINGS) ×4 IMPLANT
SUT MNCRL AB 3-0 PS2 18 (SUTURE) ×8 IMPLANT
SUT VICRYL 0 TIES 12 18 (SUTURE) ×2 IMPLANT
SUT VICRYL 0 UR6 27IN ABS (SUTURE) ×2 IMPLANT
SYR 50ML LL SCALE MARK (SYRINGE) ×2 IMPLANT
TOWEL OR 17X24 6PK STRL BLUE (TOWEL DISPOSABLE) ×2 IMPLANT
TRAY LAPAROSCOPIC (CUSTOM PROCEDURE TRAY) ×2 IMPLANT
TROCAR XCEL NON-BLD 11X100MML (ENDOMECHANICALS) ×2 IMPLANT
TROCAR XCEL NON-BLD 5MMX100MML (ENDOMECHANICALS) ×2 IMPLANT

## 2012-10-19 NOTE — Anesthesia Preprocedure Evaluation (Signed)
Anesthesia Evaluation  Patient identified by MRN, date of birth, ID band Patient awake    Reviewed: Allergy & Precautions, H&P , NPO status , Patient's Chart, lab work & pertinent test results, reviewed documented beta blocker date and time   Airway Mallampati: I TM Distance: >3 FB Neck ROM: Full    Dental  (+) Teeth Intact and Dental Advisory Given   Pulmonary  breath sounds clear to auscultation        Cardiovascular hypertension, Pt. on medications Rhythm:Regular Rate:Normal     Neuro/Psych    GI/Hepatic   Endo/Other    Renal/GU      Musculoskeletal   Abdominal   Peds  Hematology   Anesthesia Other Findings   Reproductive/Obstetrics                           Anesthesia Physical Anesthesia Plan  ASA: III  Anesthesia Plan: General   Post-op Pain Management:    Induction: Intravenous  Airway Management Planned: Oral ETT  Additional Equipment:   Intra-op Plan:   Post-operative Plan: Extubation in OR  Informed Consent: I have reviewed the patients History and Physical, chart, labs and discussed the procedure including the risks, benefits and alternatives for the proposed anesthesia with the patient or authorized representative who has indicated his/her understanding and acceptance.   Dental advisory given  Plan Discussed with: CRNA, Anesthesiologist and Surgeon  Anesthesia Plan Comments:         Anesthesia Quick Evaluation

## 2012-10-19 NOTE — Telephone Encounter (Signed)
The wife called for the pt who just had surgery today.  He has a little bit of of oozing at the umbilicus.  She wanted to know what to do about that and when he can change the dressing.  I told her to change that dressing now so she can monitor the drainage.  I said the dressings should usually stay on for 48 hours postop and then be taken off.  He can then shower.  I told her to keep the steri strips on for 7-10 days.  I made the postop appointment

## 2012-10-19 NOTE — Transfer of Care (Signed)
Immediate Anesthesia Transfer of Care Note  Patient: Andre Shaffer  Procedure(s) Performed: Procedure(s) (LRB) with comments: LAPAROSCOPIC CHOLECYSTECTOMY WITH INTRAOPERATIVE CHOLANGIOGRAM (N/A)  Patient Location: PACU  Anesthesia Type:General  Level of Consciousness: awake and alert   Airway & Oxygen Therapy: Patient Spontanous Breathing and Patient connected to face mask oxygen  Post-op Assessment: Report given to PACU RN and Post -op Vital signs reviewed and stable  Post vital signs: Reviewed and stable  Complications: No apparent anesthesia complications

## 2012-10-19 NOTE — Anesthesia Postprocedure Evaluation (Signed)
  Anesthesia Post-op Note  Patient: Andre Shaffer  Procedure(s) Performed: Procedure(s) (LRB) with comments: LAPAROSCOPIC CHOLECYSTECTOMY WITH INTRAOPERATIVE CHOLANGIOGRAM (N/A)  Patient Location: PACU  Anesthesia Type:General  Level of Consciousness: awake, alert  and oriented  Airway and Oxygen Therapy: Patient Spontanous Breathing  Post-op Pain: mild  Post-op Assessment: Post-op Vital signs reviewed  Post-op Vital Signs: Reviewed  Complications: No apparent anesthesia complications

## 2012-10-19 NOTE — Addendum Note (Signed)
Addendum  created 10/19/12 1536 by Lance Coon, CRNA   Modules edited:Anesthesia Medication Administration

## 2012-10-19 NOTE — H&P (View-Only) (Signed)
Subjective:     Patient ID: COLVIN BLATT, male   DOB: 1940/12/24, 71 y.o.   MRN: [id number]  HPI The patient is a 71 year old male who was recently admitted to the hospital for epigastric pain. Patient was studied and found to have gallstones as well as elevated LFTs. Patient likely passing a gallstone.   The patient also had an MRCP which was clear. His LFTs declined while in the hospital.  The patient has not had any subsequent pain since discharge. Review of Systems  Constitutional: Negative.   HENT: Negative.   Respiratory: Negative.   Cardiovascular: Negative.   Gastrointestinal: Negative.   Musculoskeletal: Negative.        Objective:   Physical Exam  Constitutional: He is oriented to person, place, and time. He appears well-developed and well-nourished.  HENT:  Head: Normocephalic and atraumatic.  Eyes: Conjunctivae normal are normal. Pupils are equal, round, and reactive to light.  Neck: Neck supple.  Cardiovascular: Normal rate and normal heart sounds.   Pulmonary/Chest: Effort normal and breath sounds normal.  Abdominal: Soft.  Musculoskeletal: Normal range of motion.  Neurological: He is alert and oriented to person, place, and time.       Assessment:     71 year old male with gallstones within the gallbladder, possible choledocholithiasis.    Plan:     1. we will proceed operative for laparoscopic cholecystectomy with intraoperative cholangiogram. 2.All risks and benefits were discussed with the patient, to generally include infection, bleeding, damage to surrounding structures, and recurrence. Alternatives were offered and described.  All questions were answered and the patient voiced understanding of the procedure and wishes to proceed at this point.

## 2012-10-19 NOTE — Addendum Note (Signed)
Addendum  created 10/19/12 1022 by York Grice, CRNA   Modules edited:Charges VN

## 2012-10-19 NOTE — Interval H&P Note (Signed)
History and Physical Interval Note:  10/19/2012 6:48 AM  Andre Shaffer  has presented today for surgery, with the diagnosis of gallstones  The various methods of treatment have been discussed with the patient and family. After consideration of risks, benefits and other options for treatment, the patient has consented to  Procedure(s) (LRB) with comments: LAPAROSCOPIC CHOLECYSTECTOMY WITH INTRAOPERATIVE CHOLANGIOGRAM (N/A) as a surgical intervention .  The patient's history has been reviewed, patient examined, no change in status, stable for surgery.  I have reviewed the patient's chart and labs.  Questions were answered to the patient's satisfaction.     Marigene Ehlers., Jed Limerick

## 2012-10-19 NOTE — Addendum Note (Signed)
Addendum  created 10/19/12 1536 by Onamia Azarie Coriz, CRNA   Modules edited:Anesthesia Medication Administration    

## 2012-10-19 NOTE — Anesthesia Procedure Notes (Signed)
Procedure Name: Intubation Performed by: York Grice Pre-anesthesia Checklist: Patient identified, Timeout performed, Emergency Drugs available, Suction available and Patient being monitored Patient Re-evaluated:Patient Re-evaluated prior to inductionOxygen Delivery Method: Circle system utilized Preoxygenation: Pre-oxygenation with 100% oxygen Intubation Type: IV induction Ventilation: Mask ventilation without difficulty Laryngoscope Size: Miller and 2 Grade View: Grade I Tube type: Oral Tube size: 8.0 mm Number of attempts: 1 Airway Equipment and Method: Stylet Placement Confirmation: ETT inserted through vocal cords under direct vision,  breath sounds checked- equal and bilateral and positive ETCO2 Secured at: 24 cm Tube secured with: Tape Dental Injury: Teeth and Oropharynx as per pre-operative assessment

## 2012-10-19 NOTE — Op Note (Signed)
Pre Operative Diagnosis: cholelithiasis  Post Operative Diagnosis: Same  Surgeon: Dr. Axel Filler   Procedure: lap chole with IOC  Assistant: none  Anesthesia: Gen. Endotracheal anesthesia   EBL: 20cc  Complications:  Counts: reported as correct x 2   Findings: The patient had normal IOC.  Pt was on ASA 3d ago and had oozing from port sites and liver bed that was hemostatic at the end of the case  Indications for procedure: Pt is a 71 y/o M who was seen in the hosp and admitted and likely passed a gallstone.  Pt was seen in clinic and was taken to the OR urgently.  Details of the procedure:  The patient was taken to the operating and placed in the supine position with bilateral SCDs in place. A time out was called and all facts were verified. A pneumoperitoneum was obtained via A Veress needle technique to a pressure of 14mm of mercury. A 5mm trochar was then placed in the right upper quadrant under visualization, and there were no injuries to any abdominal organs. A 11 mm port was then placed in the umbilical region after infiltrating with local anesthesia under direct visualization. A second and third epigastric port and right lower quadrant port placement under direct visualization, respectively. The gallbladder was identified and retracted, the peritoneum was then sharply dissected from the gallbladder and this dissection was carried down to Calot's triangle. The gallbladder was identified and stripped away circumferentially and seen going into the gallbladder 360. A Cook catheter was used to perform an intraoperative cholangiogram. The biliary radicals as well as the cystic duct and common bile duct were seen free of filling defects.  2 clips were placed proximally one distally and the cystic duct transected. The cystic artery was identified and 2 clips placed proximally and one distally and transected.  We then proceeded to remove the gallbladder off the hepatic fossa with Bovie  cautery.  The gallbladder bed was very oozy secondary to the patient's use of ASA up to 3d ago.  An Endo Catch bag was then placed in the abdomen and gallbladder placed in the bag. The hepatic fossa was then reexamined and hemostasis was achieved with Bovie cautery and was good at the end of the case. The subhepatic fossa and perihepatic fossa was then irrigated until the effluent was clear. The 11 mm trocar fascia was reapproximated with the Endo Close #1 Vicryl.  The pneumoperitoneum was evacuated and all trochars removed under direct visulalization there was no bleeding from any trochar sites.  The skin was then closed with 4-0 Monocryl and the skin dressed with Steri-Strips, gauze, and tape.  The patient was awaken from general anesthesia and taken to the recovery room in stable condition.

## 2012-10-20 MED FILL — Metronidazole in NaCl 0.79% IV Soln 500 MG/100ML: INTRAVENOUS | Qty: 100 | Status: AC

## 2012-10-23 ENCOUNTER — Ambulatory Visit (INDEPENDENT_AMBULATORY_CARE_PROVIDER_SITE_OTHER): Payer: Self-pay | Admitting: General Surgery

## 2012-10-23 ENCOUNTER — Encounter (HOSPITAL_BASED_OUTPATIENT_CLINIC_OR_DEPARTMENT_OTHER): Payer: Self-pay | Admitting: General Surgery

## 2012-10-26 ENCOUNTER — Ambulatory Visit (INDEPENDENT_AMBULATORY_CARE_PROVIDER_SITE_OTHER): Payer: Medicare Other | Admitting: General Surgery

## 2012-10-26 ENCOUNTER — Encounter (INDEPENDENT_AMBULATORY_CARE_PROVIDER_SITE_OTHER): Payer: Self-pay | Admitting: General Surgery

## 2012-10-26 VITALS — BP 130/78 | HR 72 | Temp 98.0°F | Resp 16 | Ht 74.0 in | Wt 248.2 lb

## 2012-10-26 DIAGNOSIS — Z9049 Acquired absence of other specified parts of digestive tract: Secondary | ICD-10-CM

## 2012-10-26 DIAGNOSIS — Z9889 Other specified postprocedural states: Secondary | ICD-10-CM

## 2012-10-26 MED ORDER — CLINDAMYCIN HCL 150 MG PO CAPS: 150.0000 mg | ORAL_CAPSULE | Freq: Four times a day (QID) | ORAL | Status: AC

## 2012-10-26 NOTE — Progress Notes (Signed)
Subjective:     Patient ID: Andre Shaffer, male   DOB: Jan 21, 1941, 71 y.o.   MRN: [id number]  HPI Pt is s/p 7 d from lap chole.  PT today had some SS drainge from isi umb wound.  Also foul smelling from her wife.  Minimal pain.  Review of Systems  Constitutional: Negative.   HENT: Negative.   Respiratory: Negative.   Cardiovascular: Negative.   Gastrointestinal: Negative.   Musculoskeletal: Negative.   Neurological: Negative.        Objective:   Physical Exam  Constitutional: He appears well-developed and well-nourished.  HENT:  Head: Normocephalic and atraumatic.  Eyes: Conjunctivae normal are normal. Pupils are equal, round, and reactive to light.  Neck: Normal range of motion. Neck supple.  Cardiovascular: Normal rate and normal heart sounds.   Pulmonary/Chest: Effort normal and breath sounds normal.  Abdominal: Soft. Bowel sounds are normal. There is no tenderness. There is no rebound and no guarding.         Assessment:     71 s/p lap chole with small wound infection    Plan:     1. Clindamycin for wound infection 2. F/u 2 weeks.

## 2012-10-30 ENCOUNTER — Telehealth (INDEPENDENT_AMBULATORY_CARE_PROVIDER_SITE_OTHER): Payer: Self-pay | Admitting: General Surgery

## 2012-10-30 NOTE — Telephone Encounter (Signed)
This is Dr Ramirez's pt. He should be contacted regarding this.

## 2012-10-30 NOTE — Telephone Encounter (Signed)
Pt's wife called to report another incision (above the umbilical incision) has opened.  Since it was late on Christmas Eve, wife called the PCP who saw the pt in the office and treated with Rocephin IM.  The daughter (a Engineer, civil (consulting)) is now cleaning and packing that wound with silver gel BID.  The edges of the wound are red and overall wound looks good.  Pt is afebrile.  The umbilical wound is still draining serous fluid and he is nearly finished with the Clindamycin ordered when he was seen for the umbilical wound opening.  Next appt is 11/10/12.  Advise if he needs to be seen sooner.

## 2012-11-10 ENCOUNTER — Encounter (INDEPENDENT_AMBULATORY_CARE_PROVIDER_SITE_OTHER): Payer: Self-pay | Admitting: General Surgery

## 2012-11-10 ENCOUNTER — Ambulatory Visit (INDEPENDENT_AMBULATORY_CARE_PROVIDER_SITE_OTHER): Payer: Medicare Other | Admitting: General Surgery

## 2012-11-10 VITALS — BP 132/78 | HR 72 | Temp 97.8°F | Resp 16 | Ht 74.0 in | Wt 256.2 lb

## 2012-11-10 DIAGNOSIS — Z9049 Acquired absence of other specified parts of digestive tract: Secondary | ICD-10-CM

## 2012-11-10 DIAGNOSIS — Z9889 Other specified postprocedural states: Secondary | ICD-10-CM

## 2012-11-10 NOTE — Progress Notes (Signed)
Patient ID: Andre Shaffer, male   DOB: 1940-12-28, 72 y.o.   MRN: [id number] The patient is a 72 year old male status post left upper cholecystectomy. Patient had a epigastric infection was treated with antibiotics. The area opened up and subsequently been granulating in. He has had no difficulties with tolerating diet her bowel function  On exam: His umbilical wound is clean dry and intact, the epigastric wound to approximately 1 cm by half centimeter deep beefy-red no visual drainage small amount of surrounding erythema, patient has scabs in his right upper quadrant and right lower quadrant wound.  Assessment and plan: Patient continued with dressing changes to his epigastric wound, no antibiotics at this time  Patient followup x2 weeks.

## 2012-11-22 ENCOUNTER — Ambulatory Visit (INDEPENDENT_AMBULATORY_CARE_PROVIDER_SITE_OTHER): Payer: Medicare Other | Admitting: General Surgery

## 2012-11-22 ENCOUNTER — Encounter (INDEPENDENT_AMBULATORY_CARE_PROVIDER_SITE_OTHER): Payer: Self-pay | Admitting: General Surgery

## 2012-11-22 ENCOUNTER — Encounter (INDEPENDENT_AMBULATORY_CARE_PROVIDER_SITE_OTHER): Payer: Medicare Other | Admitting: General Surgery

## 2012-11-22 VITALS — BP 134/78 | HR 77 | Temp 98.5°F | Ht 74.0 in | Wt 255.0 lb

## 2012-11-22 DIAGNOSIS — Z9049 Acquired absence of other specified parts of digestive tract: Secondary | ICD-10-CM

## 2012-11-22 DIAGNOSIS — Z9889 Other specified postprocedural states: Secondary | ICD-10-CM

## 2012-11-22 NOTE — Progress Notes (Signed)
Patient ID: Andre Shaffer, male   DOB: [birth year], 73 y.o.   MRN: [id number] The patient has been doing well since his last clinic visit. Patient previously had a skin dehiscence in his right quadrant in the epigastric wound. Patient had no drainage and his wounds have been granulated well.  On exam: Clean dry and intact. His epigastrium is approximately 1- 0.5 cm. The granulation base.  Assessment and plan: 72 year old male post laparoscopic cholecystectomy To followup when necessary The patient clinic should he have any wound complications were discussed.

## 2012-11-28 ENCOUNTER — Encounter (INDEPENDENT_AMBULATORY_CARE_PROVIDER_SITE_OTHER): Payer: Self-pay

## 2014-04-18 ENCOUNTER — Encounter: Payer: Self-pay | Admitting: Nurse Practitioner

## 2014-04-19 ENCOUNTER — Encounter (HOSPITAL_COMMUNITY): Payer: Self-pay | Admitting: Emergency Medicine

## 2014-04-19 ENCOUNTER — Emergency Department (HOSPITAL_COMMUNITY)
Admission: EM | Admit: 2014-04-19 | Discharge: 2014-04-19 | Disposition: A | Discharge status: Home / Self Care | Payer: Medicare Other | Attending: Emergency Medicine | Admitting: Emergency Medicine

## 2014-04-19 ENCOUNTER — Emergency Department (HOSPITAL_COMMUNITY): Payer: Medicare Other

## 2014-04-19 DIAGNOSIS — Z880 Allergy status to penicillin: Secondary | ICD-10-CM | POA: Insufficient documentation

## 2014-04-19 DIAGNOSIS — Z951 Presence of aortocoronary bypass graft: Secondary | ICD-10-CM | POA: Insufficient documentation

## 2014-04-19 DIAGNOSIS — R739 Hyperglycemia, unspecified: Secondary | ICD-10-CM

## 2014-04-19 DIAGNOSIS — E119 Type 2 diabetes mellitus without complications: Secondary | ICD-10-CM | POA: Insufficient documentation

## 2014-04-19 DIAGNOSIS — Z87891 Personal history of nicotine dependence: Secondary | ICD-10-CM | POA: Insufficient documentation

## 2014-04-19 DIAGNOSIS — I10 Essential (primary) hypertension: Secondary | ICD-10-CM | POA: Insufficient documentation

## 2014-04-19 DIAGNOSIS — Z79899 Other long term (current) drug therapy: Secondary | ICD-10-CM | POA: Insufficient documentation

## 2014-04-19 DIAGNOSIS — I252 Old myocardial infarction: Secondary | ICD-10-CM | POA: Insufficient documentation

## 2014-04-19 DIAGNOSIS — M129 Arthropathy, unspecified: Secondary | ICD-10-CM | POA: Insufficient documentation

## 2014-04-19 DIAGNOSIS — E785 Hyperlipidemia, unspecified: Secondary | ICD-10-CM | POA: Insufficient documentation

## 2014-04-19 DIAGNOSIS — I493 Ventricular premature depolarization: Secondary | ICD-10-CM

## 2014-04-19 DIAGNOSIS — I4949 Other premature depolarization: Secondary | ICD-10-CM | POA: Insufficient documentation

## 2014-04-19 DIAGNOSIS — Z7982 Long term (current) use of aspirin: Secondary | ICD-10-CM | POA: Insufficient documentation

## 2014-04-19 LAB — CBC WITH DIFFERENTIAL/PLATELET
Basophils Absolute: 0 10*3/uL (ref 0.0–0.1)
Basophils Relative: 0 % (ref 0–1)
EOS ABS: 0 10*3/uL (ref 0.0–0.7)
EOS PCT: 0 % (ref 0–5)
HEMATOCRIT: 42.8 % (ref 39.0–52.0)
HEMOGLOBIN: 14.7 g/dL (ref 13.0–17.0)
LYMPHS ABS: 1.3 10*3/uL (ref 0.7–4.0)
LYMPHS PCT: 26 % (ref 12–46)
MCH: 31.5 pg (ref 26.0–34.0)
MCHC: 34.3 g/dL (ref 30.0–36.0)
MCV: 91.8 fL (ref 78.0–100.0)
MONO ABS: 0.5 10*3/uL (ref 0.1–1.0)
MONOS PCT: 10 % (ref 3–12)
Neutro Abs: 3.1 10*3/uL (ref 1.7–7.7)
Neutrophils Relative %: 64 % (ref 43–77)
PLATELETS: 109 10*3/uL — AB (ref 150–400)
RBC: 4.66 MIL/uL (ref 4.22–5.81)
RDW: 13.6 % (ref 11.5–15.5)
WBC: 4.8 10*3/uL (ref 4.0–10.5)

## 2014-04-19 LAB — I-STAT CHEM 8, ED
BUN: 16 mg/dL (ref 6–23)
CHLORIDE: 100 meq/L (ref 96–112)
CREATININE: 1.2 mg/dL (ref 0.50–1.35)
Calcium, Ion: 1.31 mmol/L — ABNORMAL HIGH (ref 1.13–1.30)
Glucose, Bld: 257 mg/dL — ABNORMAL HIGH (ref 70–99)
HCT: 45 % (ref 39.0–52.0)
Hemoglobin: 15.3 g/dL (ref 13.0–17.0)
Potassium: 4.9 mEq/L (ref 3.7–5.3)
SODIUM: 139 meq/L (ref 137–147)
TCO2: 24 mmol/L (ref 0–100)

## 2014-04-19 LAB — I-STAT TROPONIN, ED: Troponin i, poc: 0 ng/mL (ref 0.00–0.08)

## 2014-04-19 NOTE — ED Provider Notes (Signed)
Care was discussed between myself, and Evalee Jefferson PA.  Patient seen by myself and evaluated independently. History was reviewed patient examined her. Essentially the patient had a Myocardial infarction in 1997 he felt vague symptoms with fatigue. He has done quite well. He followed with  [facility] cardiology. Last visit was 18 months ago for clearance prior to a cholecystectomy.   He has felt symptoms since Tuesday, today is Friday. He's had 2 episodes where he stood, and felt briefly orthostatic for clearing very quickly. Denies vertigo. No chest pain or dyspnea. C&S primary care physician yesterday and had an EKG. Had T wave inversions laterally, and unifocal PVCs. Wife states that last night she felt his pulse and it was irregular. She is a retired Therapist, sports. When I discussed this with her she is adamant that it did not palpate like atrial fibrillation.  She describes occasional irregular eats that felt "harder than his other beats". He denies any sensation of irregularity or palpitations. He is asymptomatic today.   He is insulin-dependent diabetic. He takes 45 insulin the morning, 20 5 at night, also Amaryl, and 2 g daily of Glucophage.  His blood sugar was 270 yesterday. Is to 3 today. He family states that he does not check his sugar regularly.  He has been out in the sun quite a bit, not doing excessive work. States "kind of" if asked if he is hydrating well.  His EKG today shows subtle ST depressions laterally and inferiorly. No ectopy noted. I see no change in morphology of his EKG today, versus yesterday, and compared to 2013. He is not anemic. His electrolytes are otherwise in order, and his troponin is 0.00.  I checked his orthostatic vital signs in the room as I examined him. His pulse does not very. Blood pressure is stable between 140 and 128 he is asymptomatic. I think he is perfectly appropriate for discharge.  Evalee Jefferson has spoken with [facility] cardiology. He is to call for a followup  appointment.  Recheck here if any changes in the meanwhile  Andre Furry, Andre Shaffer 04/19/14 1326

## 2014-04-19 NOTE — ED Provider Notes (Signed)
CSN: [id number]     Arrival date & time 04/19/14  1033 History   First MD Initiated Contact with Patient 04/19/14 1054     Chief Complaint  Patient presents with  . Irregular Heart Beat     (Consider location/radiation/quality/duration/timing/severity/associated sxs/prior Treatment) The history is provided by the patient, the spouse and a relative.    ELIOT POPPER is a 73 y.o. male with a past medical history of DM, HTN, MI with CABG in 1997 presenting for evaluation of abnormal ekg and general feeling of "not feeling right".  He describes a vague weakness and "fuzzy headed" feeling for which he was seen by his pcp yesterday. At that visit is cbg was elevated in the mid 200 range and an ekg completed showed a new pvc and T wave inversion in his lateral precordial leads.  He denies chest pain, shortness of breath, nausea, vomiting or other complaint.  His wife at the bedside states the symptoms during his last MI prior to his bypass were also similar and vague. She also mentions she checked his pulse last night which was irregularly irregular but then resolved.  He denies feeling palpitations.     Past Medical History  Diagnosis Date  . Diabetes mellitus without complication   . MI (myocardial infarction)   . Hypertension   . Hyperlipemia   . Arthritis    Past Surgical History  Procedure Laterality Date  . Tibia fracture surgery    . Ankle surgery  93,03    rt and lt   . Coronary artery bypass graft  1997  . Colonoscopy    . Cholecystectomy  10/19/2012    Procedure: LAPAROSCOPIC CHOLECYSTECTOMY WITH INTRAOPERATIVE CHOLANGIOGRAM;  Surgeon: Ralene Ok, MD;  Location: [facility];  Service: General;  Laterality: N/A;   No family history on file. History  Substance Use Topics  . Smoking status: Former Smoker    Quit date: 10/19/1975  . Smokeless tobacco: Not on file  . Alcohol Use: No    Review of Systems  Constitutional: Negative for fever.  HENT:  Negative for congestion and sore throat.   Eyes: Negative.   Respiratory: Negative for chest tightness, shortness of breath and wheezing.   Cardiovascular: Negative for chest pain, palpitations and leg swelling.  Gastrointestinal: Negative for nausea and abdominal pain.  Genitourinary: Negative.   Musculoskeletal: Negative for arthralgias, joint swelling and neck pain.  Skin: Negative.  Negative for rash and wound.  Neurological: Positive for weakness and light-headedness. Negative for dizziness, numbness and headaches.  Psychiatric/Behavioral: Negative.       Allergies  Nitroglycerin; Tetanus toxoid; and Penicillins  Home Medications   Prior to Admission medications   Medication Sig Start Date End Date Taking? Authorizing Provider  aspirin EC 325 MG tablet Take 325 mg by mouth daily.   Yes Historical Provider, MD  atorvastatin (LIPITOR) 40 MG tablet Take 40 mg by mouth every evening.   Yes Historical Provider, MD  gabapentin (NEURONTIN) 300 MG capsule Take 300 mg by mouth at bedtime.   Yes Historical Provider, MD  glimepiride (AMARYL) 4 MG tablet Take 4 mg by mouth 2 (two) times daily.   Yes Historical Provider, MD  ibuprofen (ADVIL,MOTRIN) 200 MG tablet Take 200 mg by mouth every 8 (eight) hours as needed for moderate pain.   Yes Historical Provider, MD  losartan (COZAAR) 50 MG tablet Take 50 mg by mouth daily at 12 noon.   Yes Historical Provider, MD  metFORMIN (GLUCOPHAGE) 1000 MG  tablet Take 1,000 mg by mouth 2 (two) times daily with a meal.   Yes Historical Provider, MD  metoprolol succinate (TOPROL-XL) 25 MG 24 hr tablet Take 25 mg by mouth daily at 12 noon.    Yes Historical Provider, MD   BP 151/77  Pulse 72  Temp(Src) 97.7 F (36.5 C)  Resp 29  Wt 270 lb (122.471 kg)  SpO2 95% Physical Exam  Nursing note and vitals reviewed. Constitutional: He appears well-developed and well-nourished.  HENT:  Head: Normocephalic and atraumatic.  Eyes: Conjunctivae are normal.   Neck: Normal range of motion.  Cardiovascular: Normal rate, regular rhythm, normal heart sounds and intact distal pulses.   Pulmonary/Chest: Effort normal and breath sounds normal. He has no wheezes. He has no rales.  Abdominal: Soft. Bowel sounds are normal. There is no tenderness.  Musculoskeletal: Normal range of motion. He exhibits no edema.  Neurological: He is alert.  Skin: Skin is warm and dry.  Psychiatric: He has a normal mood and affect.    ED Course  Procedures (including critical care time) Labs Review Labs Reviewed  I-STAT CHEM 8, ED - Abnormal; Notable for the following:    Glucose, Bld 257 (*)    Calcium, Ion 1.31 (*)    All other components within normal limits  CBC WITH DIFFERENTIAL  Randolm Anastasija Anfinson, ED    Imaging Review Dg Chest Port 1 View  04/19/2014   CLINICAL DATA:  Weakness.  EXAM: PORTABLE CHEST - 1 VIEW  COMPARISON:  None.  FINDINGS: Prior CABG. Heart and mediastinal contours are within normal limits. No focal opacities or effusions. No acute bony abnormality.  IMPRESSION: No active cardiopulmonary disease.   Electronically Signed   By: Rolm Baptise M.D.   On: 04/19/2014 11:50     EKG Interpretation   Date/Time:  Friday April 19 2014 10:35:48 EDT Ventricular Rate:  73 PR Interval:  150 QRS Duration: 90 QT Interval:  356 QTC Calculation: 392 R Axis:   58 Text Interpretation:  Normal sinus rhythm Low voltage QRS Nonspecific T  wave abnormality Abnormal ECG Confirmed by Jeneen Rinks  MD, [place] ([id number]) on  04/19/2014 10:48:03 AM      MDM   Final diagnoses:  None    Patients labs and/or radiological studies were viewed and considered during the medical decision making and disposition process.  Pts cardiologist is Dr Johnsie Cancel.  Pt is symptom free here.  Discussed with [facility] - will call pt on Monday for a recheck next week in office.  Labs and ekg stable with no palpitations/ ectopy on monitor today.  He is not orthostatic.  Pt seen by Dr Jeneen Rinks during ed  visit.       Evalee Jefferson, PA-C 04/19/14 1334

## 2014-04-19 NOTE — Discharge Instructions (Signed)
Call for followup appointment with your cardiologist. Keep yourself well-hydrated especially if outdoors. Check and record your blood sugars twice daily. Avoid caffeine. Return to the emergency room if you have any further difficulties.   Premature Ventricular Contraction Premature ventricular contraction (PVC) is an irregularity of the heart rhythm involving extra or skipped heartbeats. In some cases, they may occur without obvious cause or heart disease. Other times, they can be caused by an electrolyte change in the blood. These need to be corrected. They can also be seen when there is not enough oxygen going to the heart. A common cause of this is plaque or cholesterol buildup. This buildup decreases the blood supply to the heart. In addition, extra beats may be caused or aggravated by:  Excessive smoking.  Alcohol consumption.  Caffeine.  Certain medications  Some street drugs. SYMPTOMS   The sensation of feeling your heart skipping a beat (palpitations).  In many cases, the person may have no symptoms. SIGNS AND TESTS   A physical examination may show an occasional irregularity, but if the PVC beats do not happen often, they may not be found on physical exam.  Blood pressure is usually normal.  Other tests that may find extra beats of the heart are:  An EKG (electrocardiogram)  A Holter monitor which can monitor your heart over longer periods of time  An Angiogram (study of the heart arteries). TREATMENT  Usually extra heartbeats do not need treatment. The condition is treated only if symptoms are severe or if extra beats are very frequent or are causing problems. An underlying cause, if discovered, may also require treatment.  Treatment may also be needed if there may be a risk for other more serious cardiac arrhythmias.  PREVENTION   Moderation in caffeine, alcohol, and tobacco use may reduce the risk of ectopic heartbeats in some people.  Exercise often helps  people who lead a sedentary (inactive) lifestyle. PROGNOSIS  PVC heartbeats are generally harmless and do not need treatment.  RISKS AND COMPLICATIONS   Ventricular tachycardia (occasionally).  There usually are no complications.  Other arrhythmias (occasionally). SEEK IMMEDIATE MEDICAL CARE IF:   You feel palpitations that are frequent or continual.  You develop chest pain or other problems such as shortness of breath, sweating, or nausea and vomiting.  You become light-headed or faint (pass out).  You get worse or do not improve with treatment. Document Released: 06/04/2004 Document Revised: 01/10/2012 Document Reviewed: 12/15/2007 [facility] Patient Information 2015 [place], [place]. This information is not intended to replace advice given to you by your health care provider. Make sure you discuss any questions you have with your health care provider.

## 2014-04-19 NOTE — ED Notes (Signed)
Went to dr for elevated sugars and was found to have ireeg heart beat  Saw dr yesterday  Denies cp hx of bypass

## 2014-04-22 ENCOUNTER — Telehealth: Payer: Self-pay | Admitting: Cardiovascular Disease

## 2014-04-22 NOTE — Telephone Encounter (Signed)
New problem    Per pt's wife ER Dr. Tanna Furry told pt he needs to be seen this week follow up to his ER visit.  Please advise.

## 2014-04-22 NOTE — Telephone Encounter (Signed)
Called patient back and advised that Dr.Nishan's schedule this week has no openings. He understands that I will pass this message to Devra Dopp LPN to discuss with Dr.Nishan on 6/24 and call him back. He is having an MRI on 6/24 at 1230pm but will have his cell phone with him and can leave a message.

## 2014-04-24 NOTE — Telephone Encounter (Signed)
SPOKE  WITH  PT   FEELS  FINE AT THIS TIME  HAS  APPT WITH LOR[facility]   [facility]HARDT ON  FRI   04-26-14 AT  1:30 PM ENCOURAGED PT  TO KEEP APPT   PT  VERBALIZED  UIDERSTANDING . ALSO  PT  HAD  MRI   COMPLETED  TODAY  ./CY

## 2014-04-24 NOTE — ED Provider Notes (Signed)
Medical screening examination/treatment/procedure(s) were conducted as a shared visit with non-physician practitioner(s) and myself.  I personally evaluated the patient during the encounter.   EKG Interpretation   Date/Time:  Friday April 19 2014 10:35:48 EDT Ventricular Rate:  73 PR Interval:  150 QRS Duration: 90 QT Interval:  356 QTC Calculation: 392 R Axis:   58 Text Interpretation:  Normal sinus rhythm Low voltage QRS Nonspecific T  wave abnormality Abnormal ECG Confirmed by Jeneen Rinks  MD, [place] ([id number]) on  04/19/2014 10:48:03 AM      Please see my additional dictation on this patient.  Tanna Furry, MD 04/24/14 0830

## 2014-04-26 ENCOUNTER — Encounter (INDEPENDENT_AMBULATORY_CARE_PROVIDER_SITE_OTHER): Payer: Medicare Other

## 2014-04-26 ENCOUNTER — Encounter: Payer: Self-pay | Admitting: Radiology

## 2014-04-26 ENCOUNTER — Encounter: Payer: Self-pay | Admitting: Nurse Practitioner

## 2014-04-26 ENCOUNTER — Ambulatory Visit (INDEPENDENT_AMBULATORY_CARE_PROVIDER_SITE_OTHER): Payer: Medicare Other | Admitting: Nurse Practitioner

## 2014-04-26 ENCOUNTER — Encounter: Payer: Self-pay | Admitting: Cardiovascular Disease

## 2014-04-26 VITALS — BP 120/80 | HR 69 | Ht 74.0 in | Wt 269.8 lb

## 2014-04-26 DIAGNOSIS — I259 Chronic ischemic heart disease, unspecified: Secondary | ICD-10-CM

## 2014-04-26 DIAGNOSIS — I6350 Cerebral infarction due to unspecified occlusion or stenosis of unspecified cerebral artery: Secondary | ICD-10-CM

## 2014-04-26 DIAGNOSIS — R002 Palpitations: Secondary | ICD-10-CM

## 2014-04-26 DIAGNOSIS — I639 Cerebral infarction, unspecified: Secondary | ICD-10-CM

## 2014-04-26 DIAGNOSIS — E785 Hyperlipidemia, unspecified: Secondary | ICD-10-CM

## 2014-04-26 NOTE — Addendum Note (Signed)
Addended by: Tamsen Snider on: 04/26/2014 03:28 PM   Modules accepted: Orders

## 2014-04-26 NOTE — Progress Notes (Signed)
Patient ID: Andre Shaffer, male   DOB: [birth year], 73 y.o.   MRN: [id number] E cardio 30 day monitor applied

## 2014-04-26 NOTE — Progress Notes (Signed)
Andre Shaffer Date of Birth: 1941/05/16 Medical Record #[id number]  History of Present Illness: Andre Shaffer is seen back today for a post ER visit. Seen for Dr. Johnsie Cancel. 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 Servando Snare. Other issues include DM, HLD and HTN. Not clear in the EPIC chart as to his last Myoview ?2010. Remote echo from 1997.   He was last seen here in this office back in December of 2013 - for a pre op clearance. Felt to be stable for a cholecystectomy.  Most recently was in the ER and was told to follow up here. He was in the ER for "2 episodes where he stood, and felt briefly orthostatic for clearing very quickly. Denies vertigo. No chest pain or dyspnea. C&S primary care physician yesterday and had an EKG. Had T wave inversions laterally, and unifocal PVCs. Wife states that last night she felt his pulse and it was irregular. She is a retired Therapist, sports. When I discussed this with her she is adamant that it did not palpate like atrial fibrillation. She describes occasional irregular eats that felt "harder than his other beats". He denies any sensation of irregularity or palpitations. He is asymptomatic today."  Comes in today. Here with his wife. He feels ok now. Wife describes where he had 2 spells of getting up and feeling dizzy/"fuzzy" in his head. Sugar was 245. Went to see his PCP - PVCs on EKG noted with diffuse ST/T wave changes but no acute findings. That night she noticed he had hard breathing and an irregular heart beat - says "it was all over the place". Next morning his HR was fine. Went to the ER where things checked out ok. Admits that he has probably been out in the heat and does not drink enough water. No regular exercise. No chest pain but really never had chest pain with his MI. Not short of breath. Not very active. No palpitations. Wife concerned about memory issues. 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 A1C is about 7.6.   Current Outpatient Prescriptions  Medication Sig Dispense Refill  . aspirin EC 325 MG tablet Take 325 mg by mouth daily.      Marland Kitchen atorvastatin (LIPITOR) 40 MG tablet Take 40 mg by mouth every evening.      . diazepam (VALIUM) 2 MG tablet Take 2 mg by mouth every 12 (twelve) hours as needed for anxiety (uses for closed tests, (MRI)).       Marland Kitchen gabapentin (NEURONTIN) 300 MG capsule Take 300 mg by mouth at bedtime.      Marland Kitchen glimepiride (AMARYL) 4 MG tablet Take 4 mg by mouth 2 (two) times daily.      Marland Kitchen ibuprofen (ADVIL,MOTRIN) 200 MG tablet Take 200 mg by mouth every 8 (eight) hours as needed for moderate pain.      Marland Kitchen losartan (COZAAR) 50 MG tablet Take 50 mg by mouth daily at 12 noon.      . metFORMIN (GLUCOPHAGE) 1000 MG tablet Take 1,000 mg by mouth 2 (two) times daily with a meal.      . metoprolol succinate (TOPROL-XL) 25 MG 24 hr tablet Take 25 mg by mouth daily at 12 noon.        No current facility-administered medications for this visit.    Allergies  Allergen Reactions  . Nitroglycerin Other (See Comments)  Blood pressure 'bottomed out'  . Tetanus Toxoid Other (See Comments)    Severe flu symptoms fever and chills  . Penicillins Rash    Past Medical History  Diagnosis Date  . Diabetes mellitus without complication   . MI (myocardial infarction)   . Hypertension   . Hyperlipemia   . Arthritis     Past Surgical History  Procedure Laterality Date  . Tibia fracture surgery    . Ankle surgery  93,03    rt and lt   . Coronary artery bypass graft  1997  . Colonoscopy    . Cholecystectomy  10/19/2012    Procedure: LAPAROSCOPIC CHOLECYSTECTOMY WITH INTRAOPERATIVE CHOLANGIOGRAM;  Surgeon: Ralene Ok, MD;  Location: [facility];  Service: General;  Laterality: N/A;    History  Smoking status  . Former Smoker  . Quit date: 10/19/1975  Smokeless tobacco  . Not on file    History  Alcohol Use No    History  reviewed. No pertinent family history.  Review of Systems: The review of systems is per the HPI.  All other systems were reviewed and are negative.  Physical Exam: BP 120/80  Pulse 69  Ht 6\' 2"  (1.88 m)  Wt 269 lb 12.8 oz (122.38 kg)  BMI 34.63 kg/m2  SpO2 95% Patient is very pleasant and in no acute distress. He is obese. Looks to have gained almost 20 pounds over last year and a half. Skin is warm and dry. Color is normal.  HEENT is unremarkable. Normocephalic/atraumatic. PERRL. Sclera are nonicteric. Neck is supple. No masses. No JVD. Lungs are clear. Cardiac exam shows a regular rate and rhythm. Abdomen is soft. Extremities are without edema. Gait and ROM are intact. No gross neurologic deficits noted.  Wt Readings from Last 3 Encounters:  04/26/14 269 lb 12.8 oz (122.38 kg)  04/19/14 270 lb (122.471 kg)  11/22/12 255 lb (115.667 kg)    LABORATORY DATA:  Lab Results  Component Value Date   WBC 4.8 04/19/2014   HGB 15.3 04/19/2014   HCT 45.0 04/19/2014   PLT 109* 04/19/2014   GLUCOSE 257* 04/19/2014   ALT 191* 10/14/2012   AST 122* 10/14/2012   NA 139 04/19/2014   K 4.9 04/19/2014   CL 100 04/19/2014   CREATININE 1.20 04/19/2014   BUN 16 04/19/2014   CO2 24 10/14/2012    BNP (last 3 results) No results found for this basename: PROBNP,  in the last 8760 hours   Assessment / Plan:  1. Brief orthostasis - probably multifactorial with heat exposure, PVCs etc.   2. PVC  3. CAD with remote MI and CABG from 1997 - no real symptoms at that time - needs follow up Myoview given the age of his bypass surgery and uncontrolled DM.   4. HTN -  Blood pressure ok.   5. HLD  6. DM -  Uncontrolled  7. Reported stroke from recent MRI - PCP wanting carotids and echo.  With the irregular heart beat noted I think we need to be looking for atrial fib. I am placing an event monitor for 30 days to assess.   Further disposition to follow. I have left him on his current regimen for now.  See back in 5 weeks for discussion.   Patient is agreeable to this plan and will call if any problems develop in the interim.   Burtis Junes, RN, [facility] [address] [place], [place]  [address] [phone]   

## 2014-04-26 NOTE — Patient Instructions (Signed)
We will arrange for a stress test Carlton Adam)  We will arrange for an echo, carotid doppler and event monitor  See Dr. Johnsie Cancel in 5 weeks  Call the [place] office at [phone] if you have any questions, problems or concerns.

## 2014-05-08 ENCOUNTER — Ambulatory Visit (HOSPITAL_COMMUNITY): Payer: Medicare Other | Attending: Cardiovascular Disease | Admitting: Radiology

## 2014-05-08 VITALS — BP 142/83 | Ht 74.0 in | Wt 266.0 lb

## 2014-05-08 DIAGNOSIS — R0602 Shortness of breath: Secondary | ICD-10-CM

## 2014-05-08 DIAGNOSIS — E119 Type 2 diabetes mellitus without complications: Secondary | ICD-10-CM | POA: Insufficient documentation

## 2014-05-08 DIAGNOSIS — I259 Chronic ischemic heart disease, unspecified: Secondary | ICD-10-CM

## 2014-05-08 DIAGNOSIS — I2510 Atherosclerotic heart disease of native coronary artery without angina pectoris: Secondary | ICD-10-CM

## 2014-05-08 DIAGNOSIS — I639 Cerebral infarction, unspecified: Secondary | ICD-10-CM

## 2014-05-08 DIAGNOSIS — Z794 Long term (current) use of insulin: Secondary | ICD-10-CM | POA: Insufficient documentation

## 2014-05-08 DIAGNOSIS — E785 Hyperlipidemia, unspecified: Secondary | ICD-10-CM

## 2014-05-08 DIAGNOSIS — I10 Essential (primary) hypertension: Secondary | ICD-10-CM | POA: Insufficient documentation

## 2014-05-08 DIAGNOSIS — Z8673 Personal history of transient ischemic attack (TIA), and cerebral infarction without residual deficits: Secondary | ICD-10-CM | POA: Insufficient documentation

## 2014-05-08 DIAGNOSIS — Z87891 Personal history of nicotine dependence: Secondary | ICD-10-CM | POA: Insufficient documentation

## 2014-05-08 DIAGNOSIS — R002 Palpitations: Secondary | ICD-10-CM | POA: Insufficient documentation

## 2014-05-08 DIAGNOSIS — R42 Dizziness and giddiness: Secondary | ICD-10-CM | POA: Insufficient documentation

## 2014-05-08 MED ORDER — REGADENOSON 0.4 MG/5ML IV SOLN
0.4000 mg | Freq: Once | INTRAVENOUS | Status: AC
  Administered 2014-05-08: 0.4 mg via INTRAVENOUS

## 2014-05-08 MED ORDER — TECHNETIUM TC 99M SESTAMIBI GENERIC - CARDIOLITE
30.0000 | Freq: Once | INTRAVENOUS | Status: AC | PRN
  Administered 2014-05-08: 30 via INTRAVENOUS

## 2014-05-08 MED ORDER — TECHNETIUM TC 99M SESTAMIBI GENERIC - CARDIOLITE
10.0000 | Freq: Once | INTRAVENOUS | Status: AC | PRN
  Administered 2014-05-08: 10 via INTRAVENOUS

## 2014-05-08 NOTE — Progress Notes (Signed)
[facility] [address] [place], [place] [address] [phone]    Cardiology Nuclear Med Study  Andre Shaffer is a 73 y.o. male     MRN : [id number]     DOB: 22-Feb-1941  Procedure Date: 05/08/2014  Nuclear Med Background Indication for Stress Test:  Evaluation for Ischemia and Graft Patency History: MI;CABG '97;Echo 9/97 EF:50%;Previous Nuclear Study 02/25/09 ? report Cardiac Risk Factors: CVA, History of Smoking, Hypertension, IDDM Type 2 and Lipids  Symptoms:  Dizziness, Palpitations and SOB   Nuclear Pre-Procedure Caffeine/Decaff Intake:  None NPO After: 9:30pm   Lungs:  clear O2 Sat: 97% on room air. IV 0.9% NS with Angio Cath:  22g  IV Site: L Wrist  IV Started by:  Annye Rusk, CNMT  Chest Size (in):  48 Cup Size: n/a  Height: 6\' 2"  (1.88 m)  Weight:  266 lb (120.657 kg)  BMI:  Body mass index is 34.14 kg/(m^2). Tech Comments:  No Rx this am    Nuclear Med Study 1 or 2 day study: 1 day  Stress Test Type:  Carlton Adam  Reading MD: n/a  Order Authorizing Provider:  P.Nishan MD  Resting Radionuclide: Technetium 63m Sestamibi  Resting Radionuclide Dose: 11.0 mCi   Stress Radionuclide:  Technetium 10m Sestamibi  Stress Radionuclide Dose: 33.0 mCi           Stress Protocol Rest HR: 67 Stress HR: 78  Rest BP: 142/83 Stress BP: 157/86  Exercise Time (min): n/a METS: n/a   Predicted Max HR: 146 bpm % Max HR: 53.42 bpm Rate Pressure Product: 12246   Dose of Adenosine (mg):  n/a Dose of Lexiscan: 0.4 mg  Dose of Atropine (mg): n/a Dose of Dobutamine: n/a mcg/kg/min (at max HR)  Stress Test Technologist: Ileene Hutchinson, EMT-P  Nuclear Technologist:  Annye Rusk, CNMT     Rest Procedure:  Myocardial perfusion imaging was performed at rest 45 minutes following the intravenous administration of Technetium 14m Sestamibi. Rest ECG: NSR - Normal EKG  Stress Procedure:  The patient received IV Lexiscan 0.4 mg over 15-seconds.  Technetium 33m  Sestamibi injected at 30-seconds.  Quantitative spect images were obtained after a 45 minute delay. Stress ECG: There are scattered PVCs.  QPS Raw Data Images:  Normal; no motion artifact; normal heart/lung ratio. Stress Images:  There is decreased uptake in the lateral wall. Rest Images:  There is decreased uptake in the lateral wall. Subtraction (SDS):  There is a fixed defect that is most consistent with a previous infarction. Transient Ischemic Dilatation (Normal <1.22):  1.02 Lung/Heart Ratio (Normal <0.45):  0.29  Quantitative Gated Spect Images QGS EDV:  116 ml QGS ESV:  45 ml  Impression Exercise Capacity:  Lexiscan with no exercise. BP Response:  Hypotensive blood pressure response. Clinical Symptoms:  No significant symptoms noted. ECG Impression:  No significant ECG changes with Lexiscan. Comparison with Prior Nuclear Study: No images to compare  Overall Impression:  Low risk stress nuclear study with fixed inferolateral defect consistent with scar.  LV Ejection Fraction: 62%.  LV Wall Motion:  Inferolateral akinesis  Pixie Casino, MD, [facility] Board Certified in Nuclear Cardiology Attending Cardiologist [facility]

## 2014-05-10 ENCOUNTER — Telehealth: Payer: Self-pay | Admitting: Nurse Practitioner

## 2014-05-10 NOTE — Telephone Encounter (Signed)
New message ° ° ° ° ° °Returning Andre Shaffer's call °

## 2014-05-15 ENCOUNTER — Ambulatory Visit (HOSPITAL_COMMUNITY): Payer: Medicare Other | Attending: Cardiovascular Disease | Admitting: Radiology

## 2014-05-15 ENCOUNTER — Ambulatory Visit (HOSPITAL_BASED_OUTPATIENT_CLINIC_OR_DEPARTMENT_OTHER): Payer: Medicare Other | Admitting: Cardiology

## 2014-05-15 DIAGNOSIS — I517 Cardiomegaly: Secondary | ICD-10-CM | POA: Insufficient documentation

## 2014-05-15 DIAGNOSIS — I079 Rheumatic tricuspid valve disease, unspecified: Secondary | ICD-10-CM | POA: Insufficient documentation

## 2014-05-15 DIAGNOSIS — R0602 Shortness of breath: Secondary | ICD-10-CM | POA: Insufficient documentation

## 2014-05-15 DIAGNOSIS — I059 Rheumatic mitral valve disease, unspecified: Secondary | ICD-10-CM | POA: Insufficient documentation

## 2014-05-15 DIAGNOSIS — I2589 Other forms of chronic ischemic heart disease: Secondary | ICD-10-CM | POA: Insufficient documentation

## 2014-05-15 DIAGNOSIS — G609 Hereditary and idiopathic neuropathy, unspecified: Secondary | ICD-10-CM | POA: Insufficient documentation

## 2014-05-15 DIAGNOSIS — E785 Hyperlipidemia, unspecified: Secondary | ICD-10-CM

## 2014-05-15 DIAGNOSIS — I259 Chronic ischemic heart disease, unspecified: Secondary | ICD-10-CM

## 2014-05-15 DIAGNOSIS — I10 Essential (primary) hypertension: Secondary | ICD-10-CM | POA: Insufficient documentation

## 2014-05-15 DIAGNOSIS — R42 Dizziness and giddiness: Secondary | ICD-10-CM

## 2014-05-15 DIAGNOSIS — I6529 Occlusion and stenosis of unspecified carotid artery: Secondary | ICD-10-CM

## 2014-05-15 DIAGNOSIS — I639 Cerebral infarction, unspecified: Secondary | ICD-10-CM

## 2014-05-15 DIAGNOSIS — I252 Old myocardial infarction: Secondary | ICD-10-CM | POA: Insufficient documentation

## 2014-05-15 DIAGNOSIS — E119 Type 2 diabetes mellitus without complications: Secondary | ICD-10-CM | POA: Insufficient documentation

## 2014-05-15 NOTE — Progress Notes (Signed)
Echocardiogram performed.  

## 2014-05-15 NOTE — Progress Notes (Signed)
Carotid duplex performed 

## 2014-06-10 ENCOUNTER — Ambulatory Visit (INDEPENDENT_AMBULATORY_CARE_PROVIDER_SITE_OTHER): Payer: Medicare Other | Admitting: Cardiovascular Disease

## 2014-06-10 ENCOUNTER — Encounter: Payer: Self-pay | Admitting: Cardiovascular Disease

## 2014-06-10 VITALS — BP 120/64 | HR 70 | Ht 74.0 in | Wt 264.0 lb

## 2014-06-10 DIAGNOSIS — E119 Type 2 diabetes mellitus without complications: Secondary | ICD-10-CM

## 2014-06-10 DIAGNOSIS — I10 Essential (primary) hypertension: Secondary | ICD-10-CM

## 2014-06-10 DIAGNOSIS — E782 Mixed hyperlipidemia: Secondary | ICD-10-CM

## 2014-06-10 DIAGNOSIS — Z951 Presence of aortocoronary bypass graft: Secondary | ICD-10-CM

## 2014-06-10 NOTE — Assessment & Plan Note (Signed)
Discussed low carb diet.  Target hemoglobin A1c is 6.5 or less.  Continue current medications. A1c still over 7  Discussed importance of better control for CAD

## 2014-06-10 NOTE — Assessment & Plan Note (Signed)
Well controlled.  Continue current medications and low sodium Dash type diet.    

## 2014-06-10 NOTE — Patient Instructions (Signed)
Your physician wants you to follow-up in:  6 MONTHS WITH DR NISHAN  You will receive a reminder letter in the mail two months in advance. If you don't receive a letter, please call our office to schedule the follow-up appointment. Your physician recommends that you continue on your current medications as directed. Please refer to the Current Medication list given to you today. 

## 2014-06-10 NOTE — Progress Notes (Signed)
Patient ID: Andre Shaffer, male   DOB: 07-Apr-1941, 73 y.o.   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 Servando Snare. Other issues include DM, HLD and HTN Last seen by PA in June for dizzyness and PVC;s  W/u benign  Was out in heat  A1c over 7 and BS not well controlled   05/08/14  Echo and  Myovue reviewed  Overall Impression: Low risk stress nuclear study with fixed inferolateral defect consistent with scar.  LV Ejection Fraction: 62%. LV Wall Motion: Inferolateral akinesis   Study Conclusions  - Left ventricle: The cavity size was normal. Systolic function was mildly reduced. The estimated ejection fraction was in the range of 45% to 50%. Severe hypokinesis of the inferior myocardium. Left ventricular diastolic function parameters were normal. - Left atrium: The atrium was mildly dilated.  7/16  Carotids no significant disease  ROS: Denies fever, malais, weight loss, blurry vision, decreased visual acuity, cough, sputum, SOB, hemoptysis, pleuritic pain, palpitaitons, heartburn, abdominal pain, melena, lower extremity edema, claudication, or rash.  All other systems reviewed and negative  General: Affect appropriate Healthy:  appears stated age 14: normal Neck supple with no adenopathy JVP normal no bruits no thyromegaly Lungs clear with no wheezing and good diaphragmatic motion Heart:  S1/S2 no murmur, no rub, gallop or click PMI normal Abdomen: benighn, BS positve, no tenderness, no AAA no bruit.  No HSM or HJR Distal pulses intact with no bruits No edema Neuro non-focal Skin warm and dry No muscular weakness   Current Outpatient Prescriptions  Medication Sig Dispense Refill  . aspirin EC 325 MG tablet Take 325 mg by mouth daily.      Marland Kitchen atorvastatin (LIPITOR) 40 MG tablet Take 40 mg by mouth every evening.      . diazepam (VALIUM) 2 MG tablet Take 2 mg by mouth every 12 (twelve) hours as needed for anxiety (uses for closed  tests, (MRI)).       Marland Kitchen gabapentin (NEURONTIN) 300 MG capsule Take 300 mg by mouth at bedtime.      Marland Kitchen glimepiride (AMARYL) 4 MG tablet Take 4 mg by mouth 2 (two) times daily.      Marland Kitchen ibuprofen (ADVIL,MOTRIN) 200 MG tablet Take 200 mg by mouth every 8 (eight) hours as needed for moderate pain.      Marland Kitchen losartan (COZAAR) 50 MG tablet Take 50 mg by mouth daily at 12 noon.      . metFORMIN (GLUCOPHAGE) 1000 MG tablet Take 1,000 mg by mouth 2 (two) times daily with a meal.      . metoprolol succinate (TOPROL-XL) 25 MG 24 hr tablet Take 25 mg by mouth daily at 12 noon.        No current facility-administered medications for this visit.    Allergies  Nitroglycerin; Tetanus toxoid; and Penicillins  Electrocardiogram:  SR rate 73  Old IMI  Nonspecific ST changes 6/15  Assessment and Plan

## 2014-06-10 NOTE — Assessment & Plan Note (Signed)
Cholesterol is at goal.  Continue current dose of statin and diet Rx.  No myalgias or side effects.  F/U  LFT's in 6 months. No results found for this basename: LDLCALC  Labs with primary            

## 2014-06-10 NOTE — Assessment & Plan Note (Signed)
Stable with no angina and good activity level.  Continue medical Rx  

## 2014-12-10 LAB — HM COLONOSCOPY

## 2015-11-11 DIAGNOSIS — S90112A Contusion of left great toe without damage to nail, initial encounter: Secondary | ICD-10-CM | POA: Diagnosis not present

## 2015-12-30 DIAGNOSIS — I119 Hypertensive heart disease without heart failure: Secondary | ICD-10-CM | POA: Diagnosis not present

## 2015-12-30 DIAGNOSIS — E538 Deficiency of other specified B group vitamins: Secondary | ICD-10-CM | POA: Diagnosis not present

## 2015-12-30 DIAGNOSIS — E1142 Type 2 diabetes mellitus with diabetic polyneuropathy: Secondary | ICD-10-CM | POA: Diagnosis not present

## 2015-12-30 DIAGNOSIS — E782 Mixed hyperlipidemia: Secondary | ICD-10-CM | POA: Diagnosis not present

## 2016-02-24 DIAGNOSIS — L570 Actinic keratosis: Secondary | ICD-10-CM | POA: Diagnosis not present

## 2016-03-30 DIAGNOSIS — I119 Hypertensive heart disease without heart failure: Secondary | ICD-10-CM | POA: Diagnosis not present

## 2016-03-30 DIAGNOSIS — E1142 Type 2 diabetes mellitus with diabetic polyneuropathy: Secondary | ICD-10-CM | POA: Diagnosis not present

## 2016-03-30 DIAGNOSIS — I2510 Atherosclerotic heart disease of native coronary artery without angina pectoris: Secondary | ICD-10-CM | POA: Diagnosis not present

## 2016-03-30 DIAGNOSIS — E782 Mixed hyperlipidemia: Secondary | ICD-10-CM | POA: Diagnosis not present

## 2016-03-30 DIAGNOSIS — E538 Deficiency of other specified B group vitamins: Secondary | ICD-10-CM | POA: Diagnosis not present

## 2016-06-01 DIAGNOSIS — Z125 Encounter for screening for malignant neoplasm of prostate: Secondary | ICD-10-CM | POA: Diagnosis not present

## 2016-06-01 DIAGNOSIS — Z1211 Encounter for screening for malignant neoplasm of colon: Secondary | ICD-10-CM | POA: Diagnosis not present

## 2016-06-01 DIAGNOSIS — Z0000 Encounter for general adult medical examination without abnormal findings: Secondary | ICD-10-CM | POA: Diagnosis not present

## 2016-06-01 DIAGNOSIS — Z6835 Body mass index (BMI) 35.0-35.9, adult: Secondary | ICD-10-CM | POA: Diagnosis not present

## 2016-06-29 DIAGNOSIS — L570 Actinic keratosis: Secondary | ICD-10-CM | POA: Diagnosis not present

## 2016-07-06 DIAGNOSIS — I2510 Atherosclerotic heart disease of native coronary artery without angina pectoris: Secondary | ICD-10-CM | POA: Diagnosis not present

## 2016-07-06 DIAGNOSIS — Z23 Encounter for immunization: Secondary | ICD-10-CM | POA: Diagnosis not present

## 2016-07-06 DIAGNOSIS — E538 Deficiency of other specified B group vitamins: Secondary | ICD-10-CM | POA: Diagnosis not present

## 2016-07-06 DIAGNOSIS — E782 Mixed hyperlipidemia: Secondary | ICD-10-CM | POA: Diagnosis not present

## 2016-07-06 DIAGNOSIS — E1142 Type 2 diabetes mellitus with diabetic polyneuropathy: Secondary | ICD-10-CM | POA: Diagnosis not present

## 2016-07-06 DIAGNOSIS — K219 Gastro-esophageal reflux disease without esophagitis: Secondary | ICD-10-CM | POA: Diagnosis not present

## 2016-07-06 DIAGNOSIS — I119 Hypertensive heart disease without heart failure: Secondary | ICD-10-CM | POA: Diagnosis not present

## 2016-07-09 DIAGNOSIS — E782 Mixed hyperlipidemia: Secondary | ICD-10-CM | POA: Diagnosis not present

## 2016-07-09 DIAGNOSIS — I10 Essential (primary) hypertension: Secondary | ICD-10-CM | POA: Diagnosis not present

## 2016-07-09 DIAGNOSIS — E1142 Type 2 diabetes mellitus with diabetic polyneuropathy: Secondary | ICD-10-CM | POA: Diagnosis not present

## 2016-10-12 DIAGNOSIS — K219 Gastro-esophageal reflux disease without esophagitis: Secondary | ICD-10-CM | POA: Diagnosis not present

## 2016-10-12 DIAGNOSIS — I119 Hypertensive heart disease without heart failure: Secondary | ICD-10-CM | POA: Diagnosis not present

## 2016-10-12 DIAGNOSIS — E538 Deficiency of other specified B group vitamins: Secondary | ICD-10-CM | POA: Diagnosis not present

## 2016-10-12 DIAGNOSIS — Z125 Encounter for screening for malignant neoplasm of prostate: Secondary | ICD-10-CM | POA: Diagnosis not present

## 2016-10-12 DIAGNOSIS — E1142 Type 2 diabetes mellitus with diabetic polyneuropathy: Secondary | ICD-10-CM | POA: Diagnosis not present

## 2016-10-12 DIAGNOSIS — I2510 Atherosclerotic heart disease of native coronary artery without angina pectoris: Secondary | ICD-10-CM | POA: Diagnosis not present

## 2016-10-12 DIAGNOSIS — E782 Mixed hyperlipidemia: Secondary | ICD-10-CM | POA: Diagnosis not present

## 2016-11-02 DIAGNOSIS — L300 Nummular dermatitis: Secondary | ICD-10-CM | POA: Diagnosis not present

## 2016-11-02 DIAGNOSIS — L570 Actinic keratosis: Secondary | ICD-10-CM | POA: Diagnosis not present

## 2017-01-18 DIAGNOSIS — I2510 Atherosclerotic heart disease of native coronary artery without angina pectoris: Secondary | ICD-10-CM | POA: Diagnosis not present

## 2017-01-18 DIAGNOSIS — E782 Mixed hyperlipidemia: Secondary | ICD-10-CM | POA: Diagnosis not present

## 2017-01-18 DIAGNOSIS — E1142 Type 2 diabetes mellitus with diabetic polyneuropathy: Secondary | ICD-10-CM | POA: Diagnosis not present

## 2017-01-18 DIAGNOSIS — I119 Hypertensive heart disease without heart failure: Secondary | ICD-10-CM | POA: Diagnosis not present

## 2017-01-20 DIAGNOSIS — E782 Mixed hyperlipidemia: Secondary | ICD-10-CM | POA: Diagnosis not present

## 2017-01-20 DIAGNOSIS — E1142 Type 2 diabetes mellitus with diabetic polyneuropathy: Secondary | ICD-10-CM | POA: Diagnosis not present

## 2017-04-29 DIAGNOSIS — N3001 Acute cystitis with hematuria: Secondary | ICD-10-CM | POA: Diagnosis not present

## 2017-05-09 DIAGNOSIS — I10 Essential (primary) hypertension: Secondary | ICD-10-CM | POA: Diagnosis not present

## 2017-05-09 DIAGNOSIS — E782 Mixed hyperlipidemia: Secondary | ICD-10-CM | POA: Diagnosis not present

## 2017-05-09 DIAGNOSIS — N3000 Acute cystitis without hematuria: Secondary | ICD-10-CM | POA: Diagnosis not present

## 2017-05-09 DIAGNOSIS — I119 Hypertensive heart disease without heart failure: Secondary | ICD-10-CM | POA: Diagnosis not present

## 2017-05-09 DIAGNOSIS — E1142 Type 2 diabetes mellitus with diabetic polyneuropathy: Secondary | ICD-10-CM | POA: Diagnosis not present

## 2017-05-10 DIAGNOSIS — L570 Actinic keratosis: Secondary | ICD-10-CM | POA: Diagnosis not present

## 2017-05-10 DIAGNOSIS — N3000 Acute cystitis without hematuria: Secondary | ICD-10-CM | POA: Diagnosis not present

## 2017-05-10 DIAGNOSIS — I119 Hypertensive heart disease without heart failure: Secondary | ICD-10-CM | POA: Diagnosis not present

## 2017-05-10 DIAGNOSIS — E782 Mixed hyperlipidemia: Secondary | ICD-10-CM | POA: Diagnosis not present

## 2017-08-30 DIAGNOSIS — I119 Hypertensive heart disease without heart failure: Secondary | ICD-10-CM | POA: Diagnosis not present

## 2017-08-30 DIAGNOSIS — Z23 Encounter for immunization: Secondary | ICD-10-CM | POA: Diagnosis not present

## 2017-08-30 DIAGNOSIS — E782 Mixed hyperlipidemia: Secondary | ICD-10-CM | POA: Diagnosis not present

## 2017-08-30 DIAGNOSIS — E1142 Type 2 diabetes mellitus with diabetic polyneuropathy: Secondary | ICD-10-CM | POA: Diagnosis not present

## 2017-09-06 DIAGNOSIS — I119 Hypertensive heart disease without heart failure: Secondary | ICD-10-CM | POA: Diagnosis not present

## 2017-09-06 DIAGNOSIS — E782 Mixed hyperlipidemia: Secondary | ICD-10-CM | POA: Diagnosis not present

## 2017-09-06 DIAGNOSIS — E1142 Type 2 diabetes mellitus with diabetic polyneuropathy: Secondary | ICD-10-CM | POA: Diagnosis not present

## 2017-12-05 DIAGNOSIS — L570 Actinic keratosis: Secondary | ICD-10-CM | POA: Diagnosis not present

## 2018-01-13 DIAGNOSIS — I10 Essential (primary) hypertension: Secondary | ICD-10-CM | POA: Diagnosis not present

## 2018-01-13 DIAGNOSIS — I2510 Atherosclerotic heart disease of native coronary artery without angina pectoris: Secondary | ICD-10-CM | POA: Diagnosis not present

## 2018-01-13 DIAGNOSIS — E1142 Type 2 diabetes mellitus with diabetic polyneuropathy: Secondary | ICD-10-CM | POA: Diagnosis not present

## 2018-01-13 DIAGNOSIS — Z6835 Body mass index (BMI) 35.0-35.9, adult: Secondary | ICD-10-CM | POA: Diagnosis not present

## 2018-01-13 DIAGNOSIS — I119 Hypertensive heart disease without heart failure: Secondary | ICD-10-CM | POA: Diagnosis not present

## 2018-01-13 DIAGNOSIS — E782 Mixed hyperlipidemia: Secondary | ICD-10-CM | POA: Diagnosis not present

## 2018-04-21 DIAGNOSIS — E1142 Type 2 diabetes mellitus with diabetic polyneuropathy: Secondary | ICD-10-CM | POA: Diagnosis not present

## 2018-04-21 DIAGNOSIS — I2510 Atherosclerotic heart disease of native coronary artery without angina pectoris: Secondary | ICD-10-CM | POA: Diagnosis not present

## 2018-04-21 DIAGNOSIS — I119 Hypertensive heart disease without heart failure: Secondary | ICD-10-CM | POA: Diagnosis not present

## 2018-04-21 DIAGNOSIS — M25562 Pain in left knee: Secondary | ICD-10-CM | POA: Diagnosis not present

## 2018-04-21 DIAGNOSIS — E782 Mixed hyperlipidemia: Secondary | ICD-10-CM | POA: Diagnosis not present

## 2018-04-21 DIAGNOSIS — Z6835 Body mass index (BMI) 35.0-35.9, adult: Secondary | ICD-10-CM | POA: Diagnosis not present

## 2018-04-21 DIAGNOSIS — I10 Essential (primary) hypertension: Secondary | ICD-10-CM | POA: Diagnosis not present

## 2018-05-03 DIAGNOSIS — M25511 Pain in right shoulder: Secondary | ICD-10-CM | POA: Diagnosis not present

## 2018-05-09 DIAGNOSIS — R740 Nonspecific elevation of levels of transaminase and lactic acid dehydrogenase [LDH]: Secondary | ICD-10-CM | POA: Diagnosis not present

## 2018-05-09 DIAGNOSIS — D696 Thrombocytopenia, unspecified: Secondary | ICD-10-CM | POA: Diagnosis not present

## 2018-05-29 DIAGNOSIS — R944 Abnormal results of kidney function studies: Secondary | ICD-10-CM | POA: Diagnosis not present

## 2018-05-29 DIAGNOSIS — R740 Nonspecific elevation of levels of transaminase and lactic acid dehydrogenase [LDH]: Secondary | ICD-10-CM | POA: Diagnosis not present

## 2018-06-02 DIAGNOSIS — R740 Nonspecific elevation of levels of transaminase and lactic acid dehydrogenase [LDH]: Secondary | ICD-10-CM | POA: Diagnosis not present

## 2018-06-15 DIAGNOSIS — R945 Abnormal results of liver function studies: Secondary | ICD-10-CM | POA: Diagnosis not present

## 2018-06-15 DIAGNOSIS — R740 Nonspecific elevation of levels of transaminase and lactic acid dehydrogenase [LDH]: Secondary | ICD-10-CM | POA: Diagnosis not present

## 2018-06-22 DIAGNOSIS — R740 Nonspecific elevation of levels of transaminase and lactic acid dehydrogenase [LDH]: Secondary | ICD-10-CM | POA: Diagnosis not present

## 2018-06-26 DIAGNOSIS — R945 Abnormal results of liver function studies: Secondary | ICD-10-CM | POA: Diagnosis not present

## 2018-06-26 DIAGNOSIS — R412 Retrograde amnesia: Secondary | ICD-10-CM | POA: Diagnosis not present

## 2018-06-27 DIAGNOSIS — R7989 Other specified abnormal findings of blood chemistry: Secondary | ICD-10-CM | POA: Diagnosis not present

## 2018-06-27 DIAGNOSIS — R945 Abnormal results of liver function studies: Secondary | ICD-10-CM | POA: Diagnosis not present

## 2018-06-27 DIAGNOSIS — Z9049 Acquired absence of other specified parts of digestive tract: Secondary | ICD-10-CM | POA: Diagnosis not present

## 2018-07-11 DIAGNOSIS — L570 Actinic keratosis: Secondary | ICD-10-CM | POA: Diagnosis not present

## 2018-07-26 DIAGNOSIS — E782 Mixed hyperlipidemia: Secondary | ICD-10-CM | POA: Diagnosis not present

## 2018-07-26 DIAGNOSIS — I2510 Atherosclerotic heart disease of native coronary artery without angina pectoris: Secondary | ICD-10-CM | POA: Diagnosis not present

## 2018-07-26 DIAGNOSIS — E1169 Type 2 diabetes mellitus with other specified complication: Secondary | ICD-10-CM | POA: Diagnosis not present

## 2018-07-26 DIAGNOSIS — I119 Hypertensive heart disease without heart failure: Secondary | ICD-10-CM | POA: Diagnosis not present

## 2018-07-26 DIAGNOSIS — Z6835 Body mass index (BMI) 35.0-35.9, adult: Secondary | ICD-10-CM | POA: Diagnosis not present

## 2018-07-26 DIAGNOSIS — E1142 Type 2 diabetes mellitus with diabetic polyneuropathy: Secondary | ICD-10-CM | POA: Diagnosis not present

## 2018-07-26 DIAGNOSIS — M25562 Pain in left knee: Secondary | ICD-10-CM | POA: Diagnosis not present

## 2018-08-16 DIAGNOSIS — R740 Nonspecific elevation of levels of transaminase and lactic acid dehydrogenase [LDH]: Secondary | ICD-10-CM | POA: Diagnosis not present

## 2018-08-29 DIAGNOSIS — K716 Toxic liver disease with hepatitis, not elsewhere classified: Secondary | ICD-10-CM | POA: Diagnosis not present

## 2018-09-12 DIAGNOSIS — Z23 Encounter for immunization: Secondary | ICD-10-CM | POA: Diagnosis not present

## 2018-09-25 DIAGNOSIS — J208 Acute bronchitis due to other specified organisms: Secondary | ICD-10-CM | POA: Diagnosis not present

## 2018-09-25 DIAGNOSIS — R05 Cough: Secondary | ICD-10-CM | POA: Diagnosis not present

## 2018-09-25 DIAGNOSIS — I700 Atherosclerosis of aorta: Secondary | ICD-10-CM | POA: Diagnosis not present

## 2018-09-25 DIAGNOSIS — Z951 Presence of aortocoronary bypass graft: Secondary | ICD-10-CM | POA: Diagnosis not present

## 2018-09-25 DIAGNOSIS — J181 Lobar pneumonia, unspecified organism: Secondary | ICD-10-CM | POA: Diagnosis not present

## 2018-11-07 DIAGNOSIS — E1142 Type 2 diabetes mellitus with diabetic polyneuropathy: Secondary | ICD-10-CM | POA: Diagnosis not present

## 2018-11-07 DIAGNOSIS — E1121 Type 2 diabetes mellitus with diabetic nephropathy: Secondary | ICD-10-CM | POA: Diagnosis not present

## 2018-11-07 DIAGNOSIS — E782 Mixed hyperlipidemia: Secondary | ICD-10-CM | POA: Diagnosis not present

## 2018-11-07 DIAGNOSIS — I119 Hypertensive heart disease without heart failure: Secondary | ICD-10-CM | POA: Diagnosis not present

## 2018-11-07 DIAGNOSIS — Z6835 Body mass index (BMI) 35.0-35.9, adult: Secondary | ICD-10-CM | POA: Diagnosis not present

## 2018-11-07 DIAGNOSIS — M25562 Pain in left knee: Secondary | ICD-10-CM | POA: Diagnosis not present

## 2018-11-07 DIAGNOSIS — I2510 Atherosclerotic heart disease of native coronary artery without angina pectoris: Secondary | ICD-10-CM | POA: Diagnosis not present

## 2018-11-08 DIAGNOSIS — R5383 Other fatigue: Secondary | ICD-10-CM | POA: Diagnosis not present

## 2018-11-08 DIAGNOSIS — I119 Hypertensive heart disease without heart failure: Secondary | ICD-10-CM | POA: Diagnosis not present

## 2018-11-08 DIAGNOSIS — E782 Mixed hyperlipidemia: Secondary | ICD-10-CM | POA: Diagnosis not present

## 2018-11-08 DIAGNOSIS — E1142 Type 2 diabetes mellitus with diabetic polyneuropathy: Secondary | ICD-10-CM | POA: Diagnosis not present

## 2018-11-14 DIAGNOSIS — R5383 Other fatigue: Secondary | ICD-10-CM | POA: Diagnosis not present

## 2018-11-14 DIAGNOSIS — R05 Cough: Secondary | ICD-10-CM | POA: Diagnosis not present

## 2018-11-28 DIAGNOSIS — R05 Cough: Secondary | ICD-10-CM | POA: Diagnosis not present

## 2018-11-28 DIAGNOSIS — J410 Simple chronic bronchitis: Secondary | ICD-10-CM | POA: Diagnosis not present

## 2018-11-28 DIAGNOSIS — R5383 Other fatigue: Secondary | ICD-10-CM | POA: Diagnosis not present

## 2018-12-08 DIAGNOSIS — Z951 Presence of aortocoronary bypass graft: Secondary | ICD-10-CM | POA: Diagnosis not present

## 2018-12-08 DIAGNOSIS — R5383 Other fatigue: Secondary | ICD-10-CM | POA: Diagnosis not present

## 2018-12-08 DIAGNOSIS — I700 Atherosclerosis of aorta: Secondary | ICD-10-CM | POA: Diagnosis not present

## 2018-12-08 DIAGNOSIS — R05 Cough: Secondary | ICD-10-CM | POA: Diagnosis not present

## 2019-02-13 DIAGNOSIS — I2510 Atherosclerotic heart disease of native coronary artery without angina pectoris: Secondary | ICD-10-CM | POA: Diagnosis not present

## 2019-02-13 DIAGNOSIS — Z6835 Body mass index (BMI) 35.0-35.9, adult: Secondary | ICD-10-CM | POA: Diagnosis not present

## 2019-02-13 DIAGNOSIS — Z03818 Encounter for observation for suspected exposure to other biological agents ruled out: Secondary | ICD-10-CM | POA: Diagnosis not present

## 2019-02-13 DIAGNOSIS — E1142 Type 2 diabetes mellitus with diabetic polyneuropathy: Secondary | ICD-10-CM | POA: Diagnosis not present

## 2019-02-13 DIAGNOSIS — E1121 Type 2 diabetes mellitus with diabetic nephropathy: Secondary | ICD-10-CM | POA: Diagnosis not present

## 2019-02-13 DIAGNOSIS — I119 Hypertensive heart disease without heart failure: Secondary | ICD-10-CM | POA: Diagnosis not present

## 2019-02-13 DIAGNOSIS — E782 Mixed hyperlipidemia: Secondary | ICD-10-CM | POA: Diagnosis not present

## 2019-02-14 DIAGNOSIS — E1142 Type 2 diabetes mellitus with diabetic polyneuropathy: Secondary | ICD-10-CM | POA: Diagnosis not present

## 2019-02-14 DIAGNOSIS — E782 Mixed hyperlipidemia: Secondary | ICD-10-CM | POA: Diagnosis not present

## 2019-05-15 DIAGNOSIS — Z6835 Body mass index (BMI) 35.0-35.9, adult: Secondary | ICD-10-CM | POA: Diagnosis not present

## 2019-05-15 DIAGNOSIS — E1121 Type 2 diabetes mellitus with diabetic nephropathy: Secondary | ICD-10-CM | POA: Diagnosis not present

## 2019-05-15 DIAGNOSIS — I119 Hypertensive heart disease without heart failure: Secondary | ICD-10-CM | POA: Diagnosis not present

## 2019-05-15 DIAGNOSIS — E782 Mixed hyperlipidemia: Secondary | ICD-10-CM | POA: Diagnosis not present

## 2019-05-15 DIAGNOSIS — E1142 Type 2 diabetes mellitus with diabetic polyneuropathy: Secondary | ICD-10-CM | POA: Diagnosis not present

## 2019-05-15 DIAGNOSIS — I2510 Atherosclerotic heart disease of native coronary artery without angina pectoris: Secondary | ICD-10-CM | POA: Diagnosis not present

## 2019-05-21 DIAGNOSIS — E782 Mixed hyperlipidemia: Secondary | ICD-10-CM | POA: Diagnosis not present

## 2019-05-21 DIAGNOSIS — E1142 Type 2 diabetes mellitus with diabetic polyneuropathy: Secondary | ICD-10-CM | POA: Diagnosis not present

## 2019-07-14 DIAGNOSIS — L570 Actinic keratosis: Secondary | ICD-10-CM | POA: Diagnosis not present

## 2019-08-15 DIAGNOSIS — N10 Acute tubulo-interstitial nephritis: Secondary | ICD-10-CM | POA: Diagnosis not present

## 2019-08-15 DIAGNOSIS — N3000 Acute cystitis without hematuria: Secondary | ICD-10-CM | POA: Diagnosis not present

## 2019-08-15 DIAGNOSIS — M545 Low back pain: Secondary | ICD-10-CM | POA: Diagnosis not present

## 2019-08-15 DIAGNOSIS — R0789 Other chest pain: Secondary | ICD-10-CM | POA: Diagnosis not present

## 2019-08-21 DIAGNOSIS — Z23 Encounter for immunization: Secondary | ICD-10-CM | POA: Diagnosis not present

## 2019-08-21 DIAGNOSIS — E1142 Type 2 diabetes mellitus with diabetic polyneuropathy: Secondary | ICD-10-CM | POA: Diagnosis not present

## 2019-08-21 DIAGNOSIS — Z6833 Body mass index (BMI) 33.0-33.9, adult: Secondary | ICD-10-CM | POA: Diagnosis not present

## 2019-08-21 DIAGNOSIS — E782 Mixed hyperlipidemia: Secondary | ICD-10-CM | POA: Diagnosis not present

## 2019-08-21 DIAGNOSIS — E6609 Other obesity due to excess calories: Secondary | ICD-10-CM | POA: Diagnosis not present

## 2019-08-21 DIAGNOSIS — I119 Hypertensive heart disease without heart failure: Secondary | ICD-10-CM | POA: Diagnosis not present

## 2019-08-21 DIAGNOSIS — I2510 Atherosclerotic heart disease of native coronary artery without angina pectoris: Secondary | ICD-10-CM | POA: Diagnosis not present

## 2019-08-21 DIAGNOSIS — E1121 Type 2 diabetes mellitus with diabetic nephropathy: Secondary | ICD-10-CM | POA: Diagnosis not present

## 2019-08-23 DIAGNOSIS — E1121 Type 2 diabetes mellitus with diabetic nephropathy: Secondary | ICD-10-CM | POA: Diagnosis not present

## 2019-08-23 DIAGNOSIS — I119 Hypertensive heart disease without heart failure: Secondary | ICD-10-CM | POA: Diagnosis not present

## 2019-08-23 DIAGNOSIS — E782 Mixed hyperlipidemia: Secondary | ICD-10-CM | POA: Diagnosis not present

## 2019-09-03 DIAGNOSIS — R944 Abnormal results of kidney function studies: Secondary | ICD-10-CM | POA: Diagnosis not present

## 2019-10-22 DIAGNOSIS — R05 Cough: Secondary | ICD-10-CM | POA: Diagnosis not present

## 2019-10-22 DIAGNOSIS — Z20828 Contact with and (suspected) exposure to other viral communicable diseases: Secondary | ICD-10-CM | POA: Diagnosis not present

## 2019-10-22 DIAGNOSIS — R509 Fever, unspecified: Secondary | ICD-10-CM | POA: Diagnosis not present

## 2019-10-24 ENCOUNTER — Telehealth: Payer: Self-pay | Admitting: Unknown Physician Specialty

## 2019-10-24 ENCOUNTER — Other Ambulatory Visit: Payer: Self-pay | Admitting: Unknown Physician Specialty

## 2019-10-24 DIAGNOSIS — U071 COVID-19: Secondary | ICD-10-CM

## 2019-10-24 NOTE — Telephone Encounter (Signed)
  I connected by phone with Andre Shaffer on 10/24/2019 at 4:19 PM to discuss the potential use of an new treatment for mild to moderate COVID-19 viral infection in non-hospitalized patients.  This patient is a 78 y.o. male that meets the FDA criteria for Emergency Use Authorization of bamlanivimab or casirivimab\imdevimab.  Has a (+) direct SARS-CoV-2 viral test result  Has mild or moderate COVID-19   Is ? 80 years of age and weighs ? 40 kg  Is NOT hospitalized due to COVID-19  Is NOT requiring oxygen therapy or requiring an increase in baseline oxygen flow rate due to COVID-19  Is within 10 days of symptom onset  Has at least one of the high risk factor(s) for progression to severe COVID-19 and/or hospitalization as defined in EUA.  Specific high risk criteria : >/= 80 yo   I have spoken and communicated the following to the patient or parent/caregiver:  1. FDA has authorized the emergency use of bamlanivimab and casirivimab\imdevimab for the treatment of mild to moderate COVID-19 in adults and pediatric patients with positive results of direct SARS-CoV-2 viral testing who are 82 years of age and older weighing at least 40 kg, and who are at high risk for progressing to severe COVID-19 and/or hospitalization.  2. The significant known and potential risks and benefits of bamlanivimab and casirivimab\imdevimab, and the extent to which such potential risks and benefits are unknown.  3. Information on available alternative treatments and the risks and benefits of those alternatives, including clinical trials.  4. Patients treated with bamlanivimab and casirivimab\imdevimab should continue to self-isolate and use infection control measures (e.g., wear mask, isolate, social distance, avoid sharing personal items, clean and disinfect "high touch" surfaces, and frequent handwashing) according to CDC guidelines.   5. The patient or parent/caregiver has the option to accept or refuse  bamlanivimab or casirivimab\imdevimab .  After reviewing this information with the patient, patient refuses the infusion ,[name] 10/24/2019 4:19 PM  Symptom onset 12/20

## 2019-10-24 NOTE — Telephone Encounter (Signed)
Wife called to say patient changed his mind and would like mab infusion.  Will schedule for Monday

## 2019-10-29 ENCOUNTER — Ambulatory Visit (HOSPITAL_COMMUNITY)
Admission: RE | Admit: 2019-10-29 | Discharge: 2019-10-29 | Disposition: A | Discharge status: Home / Self Care | Payer: BC Managed Care – PPO | Source: Ambulatory Visit | Attending: Pulmonary Disease | Admitting: Pulmonary Disease

## 2019-10-29 DIAGNOSIS — U071 COVID-19: Secondary | ICD-10-CM | POA: Insufficient documentation

## 2019-10-29 MED ORDER — EPINEPHRINE 0.3 MG/0.3ML IJ SOAJ: 0.3000 mg | Freq: Once | INTRAMUSCULAR | Status: DC | PRN

## 2019-10-29 MED ORDER — METHYLPREDNISOLONE SODIUM SUCC 125 MG IJ SOLR: 125.0000 mg | Freq: Once | INTRAMUSCULAR | Status: DC | PRN

## 2019-10-29 MED ORDER — SODIUM CHLORIDE 0.9 % IV SOLN
Freq: Once | INTRAVENOUS | Status: AC
  Filled 2019-10-29: qty 10

## 2019-10-29 MED ORDER — SODIUM CHLORIDE 0.9 % IV SOLN
INTRAVENOUS | Status: DC | PRN
  Administered 2019-10-29: 11:00:00 250 mL via INTRAVENOUS

## 2019-10-29 MED ORDER — ALBUTEROL SULFATE HFA 108 (90 BASE) MCG/ACT IN AERS: 2.0000 | INHALATION_SPRAY | Freq: Once | RESPIRATORY_TRACT | Status: DC | PRN

## 2019-10-29 MED ORDER — FAMOTIDINE IN NACL 20-0.9 MG/50ML-% IV SOLN: 20.0000 mg | Freq: Once | INTRAVENOUS | Status: DC | PRN

## 2019-10-29 MED ORDER — DIPHENHYDRAMINE HCL 50 MG/ML IJ SOLN: 50.0000 mg | Freq: Once | INTRAMUSCULAR | Status: DC | PRN

## 2019-10-29 NOTE — Progress Notes (Signed)
Requested by other RN to attempt new PIV placement. 22G to RAC previously placed unable to flush, no blood return. Pt reports anxiety r/t needle sticks. Pt became pale, diaphoretic prior to 2nd IV stick, c/o dizziness. BP 74/47. Pt's feet elevated, head lowered, instructed to take deep breaths. BP gradually increased over 5 min (82/47, 96/61, final check 109/59). New PIV started to LAC, 20G. Skin color returned to normal, no further c/o dizziness.

## 2019-10-29 NOTE — Progress Notes (Signed)
  Diagnosis: COVID-19  Physician: Dr. Rochel Brome  Procedure: [facility] Med: casirivimab\imdevimab infusion - Provided patient with casirivimab\imdevimab fact sheet for patients, parents and caregivers prior to infusion.  Complications: No immediate complications noted.  Discharge: Discharged home   Alcario Drought 10/29/2019

## 2019-10-29 NOTE — Discharge Instructions (Signed)
COVID-19 COVID-19 is a respiratory infection that is caused by a virus called severe acute respiratory syndrome coronavirus 2 (SARS-CoV-2). The disease is also known as coronavirus disease or novel coronavirus. In some people, the virus may not cause any symptoms. In others, it may cause a serious infection. The infection can get worse quickly and can lead to complications, such as:  Pneumonia, or infection of the lungs.  Acute respiratory distress syndrome or ARDS. This is fluid build-up in the lungs.  Acute respiratory failure. This is a condition in which there is not enough oxygen passing from the lungs to the body.  Sepsis or septic shock. This is a serious bodily reaction to an infection.  Blood clotting problems.  Secondary infections due to bacteria or fungus. The virus that causes COVID-19 is contagious. This means that it can spread from person to person through droplets from coughs and sneezes (respiratory secretions). What are the causes? This illness is caused by a virus. You may catch the virus by:  Breathing in droplets from an infected person's cough or sneeze.  Touching something, like a table or a doorknob, that was exposed to the virus (contaminated) and then touching your mouth, nose, or eyes. What increases the risk? Risk for infection You are more likely to be infected with this virus if you:  Live in or travel to an area with a COVID-19 outbreak.  Come in contact with a sick person who recently traveled to an area with a COVID-19 outbreak.  Provide care for or live with a person who is infected with COVID-19. Risk for serious illness You are more likely to become seriously ill from the virus if you:  Are 65 years of age or older.  Have a long-term disease that lowers your body's ability to fight infection (immunocompromised).  Live in a nursing home or long-term care facility.  Have a long-term (chronic) disease such as: ? Chronic lung disease, including  chronic obstructive pulmonary disease or asthma ? Heart disease. ? Diabetes. ? Chronic kidney disease. ? Liver disease.  Are obese. What are the signs or symptoms? Symptoms of this condition can range from mild to severe. Symptoms may appear any time from 2 to 14 days after being exposed to the virus. They include:  A fever.  A cough.  Difficulty breathing.  Chills.  Muscle pains.  A sore throat.  Loss of taste or smell. Some people may also have stomach problems, such as nausea, vomiting, or diarrhea. Other people may not have any symptoms of COVID-19. How is this diagnosed? This condition may be diagnosed based on:  Your signs and symptoms, especially if: ? You live in an area with a COVID-19 outbreak. ? You recently traveled to or from an area where the virus is common. ? You provide care for or live with a person who was diagnosed with COVID-19.  A physical exam.  Lab tests, which may include: ? A nasal swab to take a sample of fluid from your nose. ? A throat swab to take a sample of fluid from your throat. ? A sample of mucus from your lungs (sputum). ? Blood tests.  Imaging tests, which may include, X-rays, CT scan, or ultrasound. How is this treated? At present, there is no medicine to treat COVID-19. Medicines that treat other diseases are being used on a trial basis to see if they are effective against COVID-19. Your health care provider will talk with you about ways to treat your symptoms. For most   people, the infection is mild and can be managed at home with rest, fluids, and over-the-counter medicines. Treatment for a serious infection usually takes places in a hospital intensive care unit (ICU). It may include one or more of the following treatments. These treatments are given until your symptoms improve.  Receiving fluids and medicines through an IV.  Supplemental oxygen. Extra oxygen is given through a tube in the nose, a face mask, or a  hood.  Positioning you to lie on your stomach (prone position). This makes it easier for oxygen to get into the lungs.  Continuous positive airway pressure (CPAP) or bi-level positive airway pressure (BPAP) machine. This treatment uses mild air pressure to keep the airways open. A tube that is connected to a motor delivers oxygen to the body.  Ventilator. This treatment moves air into and out of the lungs by using a tube that is placed in your windpipe.  Tracheostomy. This is a procedure to create a hole in the neck so that a breathing tube can be inserted.  Extracorporeal membrane oxygenation (ECMO). This procedure gives the lungs a chance to recover by taking over the functions of the heart and lungs. It supplies oxygen to the body and removes carbon dioxide. Follow these instructions at home: Lifestyle  If you are sick, stay home except to get medical care. Your health care provider will tell you how long to stay home. Call your health care provider before you go for medical care.  Rest at home as told by your health care provider.  Do not use any products that contain nicotine or tobacco, such as cigarettes, e-cigarettes, and chewing tobacco. If you need help quitting, ask your health care provider.  Return to your normal activities as told by your health care provider. Ask your health care provider what activities are safe for you. General instructions  Take over-the-counter and prescription medicines only as told by your health care provider.  Drink enough fluid to keep your urine pale yellow.  Keep all follow-up visits as told by your health care provider. This is important. How is this prevented?  There is no vaccine to help prevent COVID-19 infection. However, there are steps you can take to protect yourself and others from this virus. To protect yourself:   Do not travel to areas where COVID-19 is a risk. The areas where COVID-19 is reported change often. To identify  high-risk areas and travel restrictions, check the CDC travel website: wwwnc.cdc.gov/travel/notices  If you live in, or must travel to, an area where COVID-19 is a risk, take precautions to avoid infection. ? Stay away from people who are sick. ? Wash your hands often with soap and water for 20 seconds. If soap and water are not available, use an alcohol-based hand sanitizer. ? Avoid touching your mouth, face, eyes, or nose. ? Avoid going out in public, follow guidance from your state and local health authorities. ? If you must go out in public, wear a cloth face covering or face mask. ? Disinfect objects and surfaces that are frequently touched every day. This may include:  Counters and tables.  Doorknobs and light switches.  Sinks and faucets.  Electronics, such as phones, remote controls, keyboards, computers, and tablets. To protect others: If you have symptoms of COVID-19, take steps to prevent the virus from spreading to others.  If you think you have a COVID-19 infection, contact your health care provider right away. Tell your health care team that you think you may   have a COVID-19 infection.  Stay home. Leave your house only to seek medical care. Do not use public transport.  Do not travel while you are sick.  Wash your hands often with soap and water for 20 seconds. If soap and water are not available, use alcohol-based hand sanitizer.  Stay away from other members of your household. Let healthy household members care for children and pets, if possible. If you have to care for children or pets, wash your hands often and wear a mask. If possible, stay in your own room, separate from others. Use a different bathroom.  Make sure that all people in your household wash their hands well and often.  Cough or sneeze into a tissue or your sleeve or elbow. Do not cough or sneeze into your hand or into the air.  Wear a cloth face covering or face mask. Where to find more  information  Centers for Disease Control and Prevention: PurpleGadgets.be  World Health Organization: https://www.castaneda.info/ Contact a health care provider if:  You live in or have traveled to an area where COVID-19 is a risk and you have symptoms of the infection.  You have had contact with someone who has COVID-19 and you have symptoms of the infection. Get help right away if:  You have trouble breathing.  You have pain or pressure in your chest.  You have confusion.  You have bluish lips and fingernails.  You have difficulty waking from sleep.  You have symptoms that get worse. These symptoms may represent a serious problem that is an emergency. Do not wait to see if the symptoms will go away. Get medical help right away. Call your local emergency services (911 in the [place] Do not drive yourself to the hospital. Let the emergency medical personnel know if you think you have COVID-19. Summary  COVID-19 is a respiratory infection that is caused by a virus. It is also known as coronavirus disease or novel coronavirus. It can cause serious infections, such as pneumonia, acute respiratory distress syndrome, acute respiratory failure, or sepsis.  The virus that causes COVID-19 is contagious. This means that it can spread from person to person through droplets from coughs and sneezes.  You are more likely to develop a serious illness if you are 50 years of age or older, have a weak immunity, live in a nursing home, or have chronic disease.  There is no medicine to treat COVID-19. Your health care provider will talk with you about ways to treat your symptoms.  Take steps to protect yourself and others from infection. Wash your hands often and disinfect objects and surfaces that are frequently touched every day. Stay away from people who are sick and wear a mask if you are sick. This information is not intended to replace advice given to you by  your health care provider. Make sure you discuss any questions you have with your health care provider. Document Released: 11/23/2018 Document Revised: 03/15/2019 Document Reviewed: 11/23/2018 Elsevier Patient Education  2020 [place]: How to Protect Yourself and Others Know how it spreads  There is currently no vaccine to prevent coronavirus disease 2019 (COVID-19).  The best way to prevent illness is to avoid being exposed to this virus.  The virus is thought to spread mainly from person-to-person. ? Between people who are in close contact with one another (within about 6 feet). ? Through respiratory droplets produced when an infected person coughs, sneezes or talks. ? These droplets can land in  the mouths or noses of people who are nearby or possibly be inhaled into the lungs. ? Some recent studies have suggested that COVID-19 may be spread by people who are not showing symptoms. Everyone should Clean your hands often  Wash your hands often with soap and water for at least 20 seconds especially after you have been in a public place, or after blowing your nose, coughing, or sneezing.  If soap and water are not readily available, use a hand sanitizer that contains at least 60% alcohol. Cover all surfaces of your hands and rub them together until they feel dry.  Avoid touching your eyes, nose, and mouth with unwashed hands. Avoid close contact  Stay home if you are sick.  Avoid close contact with people who are sick.  Put distance between yourself and other people. ? Remember that some people without symptoms may be able to spread virus. ? This is especially important for people who are at higher risk of getting very GainPain.com.cy Cover your mouth and nose with a cloth face cover when around others  You could spread COVID-19 to others even if you do not feel sick.  Everyone should wear a  cloth face cover when they have to go out in public, for example to the grocery store or to pick up other necessities. ? Cloth face coverings should not be placed on young children under age 42, anyone who has trouble breathing, or is unconscious, incapacitated or otherwise unable to remove the mask without assistance.  The cloth face cover is meant to protect other people in case you are infected.  Do NOT use a facemask meant for a Dietitian.  Continue to keep about 6 feet between yourself and others. The cloth face cover is not a substitute for social distancing. Cover coughs and sneezes  If you are in a private setting and do not have on your cloth face covering, remember to always cover your mouth and nose with a tissue when you cough or sneeze or use the inside of your elbow.  Throw used tissues in the trash.  Immediately wash your hands with soap and water for at least 20 seconds. If soap and water are not readily available, clean your hands with a hand sanitizer that contains at least 60% alcohol. Clean and disinfect  Clean AND disinfect frequently touched surfaces daily. This includes tables, doorknobs, light switches, countertops, handles, desks, phones, keyboards, toilets, faucets, and sinks. [email or web address]  If surfaces are dirty, clean them: Use detergent or soap and water prior to disinfection.  Then, use a household disinfectant. You can see a list of EPA-registered household disinfectants here. [email or web address].[email or web address] 03/06/2019 This information is not intended to replace advice given to you by your health care provider. Make sure you discuss any questions you have with your health care provider. Document Released: 02/13/2019 Document Revised: 03/14/2019 Document Reviewed: 02/13/2019 Elsevier Patient Education  2020 [employer].   COVID-19 Frequently Asked Questions COVID-19 (coronavirus disease) is  an infection that is caused by a large family of viruses. Some viruses cause illness in people and others cause illness in animals like camels, cats, and bats. In some cases, the viruses that cause illness in animals can spread to humans. Where did the coronavirus come from? In December 2019, [place] told the [facility]) of several cases of lung disease (human respiratory illness). These cases were linked to an open seafood and livestock market in the city of [place]. The link to the seafood and  livestock market suggests that the virus may have spread from animals to humans. However, since that first outbreak in December, the virus has also been shown to spread from person to person. What is the name of the disease and the virus? Disease name Early on, this disease was called novel coronavirus. This is because scientists determined that the disease was caused by a new (novel) respiratory virus. The World Health Organization [facility]) has now named the disease COVID-19, or coronavirus disease. Virus name The virus that causes the disease is called severe acute respiratory syndrome coronavirus 2 (SARS-CoV-2). More information on disease and virus naming World Health Organization [facility]): www.who.int/emergencies/diseases/novel-coronavirus-2019/technical-guidance/naming-the-coronavirus-disease-(covid-2019)-and-the-virus-that-causes-it Who is at risk for complications from coronavirus disease? Some people may be at higher risk for complications from coronavirus disease. This includes older adults and people who have chronic diseases, such as heart disease, diabetes, and lung disease. If you are at higher risk for complications, take these extra precautions:  Avoid close contact with people who are sick or have a fever or cough. Stay at least 3-6 ft (1-2 m) away from them, if possible.  Wash your hands often with soap and water for at least 20 seconds.  Avoid touching your face, mouth, nose, or  eyes.  Keep supplies on hand at home, such as food, medicine, and cleaning supplies.  Stay home as much as possible.  Avoid social gatherings and travel. How does coronavirus disease spread? The virus that causes coronavirus disease spreads easily from person to person (is contagious). There are also cases of community-spread disease. This means the disease has spread to:  People who have no known contact with other infected people.  People who have not traveled to areas where there are known cases. It appears to spread from one person to another through droplets from coughing or sneezing. Can I get the virus from touching surfaces or objects? There is still a lot that we do not know about the virus that causes coronavirus disease. Scientists are basing a lot of information on what they know about similar viruses, such as:  Viruses cannot generally survive on surfaces for long. They need a human body (host) to survive.  It is more likely that the virus is spread by close contact with people who are sick (direct contact), such as through: ? Shaking hands or hugging. ? Breathing in respiratory droplets that travel through the air. This can happen when an infected person coughs or sneezes on or near other people.  It is less likely that the virus is spread when a person touches a surface or object that has the virus on it (indirect contact). The virus may be able to enter the body if the person touches a surface or object and then touches his or her face, eyes, nose, or mouth. Can a person spread the virus without having symptoms of the disease? It may be possible for the virus to spread before a person has symptoms of the disease, but this is most likely not the main way the virus is spreading. It is more likely for the virus to spread by being in close contact with people who are sick and breathing in the respiratory droplets of a sick person's cough or sneeze. What are the symptoms of  coronavirus disease? Symptoms vary from person to person and can range from mild to severe. Symptoms may include:  Fever.  Cough.  Tiredness, weakness, or fatigue.  Fast breathing or feeling short of breath. These symptoms can appear anywhere from  2 to 14 days after you have been exposed to the virus. If you develop symptoms, call your health care provider. People with severe symptoms may need hospital care. If I am exposed to the virus, how long does it take before symptoms start? Symptoms of coronavirus disease may appear anywhere from 2 to 14 days after a person has been exposed to the virus. If you develop symptoms, call your health care provider. Should I be tested for this virus? Your health care provider will decide whether to test you based on your symptoms, history of exposure, and your risk factors. How does a health care provider test for this virus? Health care providers will collect samples to send for testing. Samples may include:  Taking a swab of fluid from the nose.  Taking fluid from the lungs by having you cough up mucus (sputum) into a sterile cup.  Taking a blood sample.  Taking a stool or urine sample. Is there a treatment or vaccine for this virus? Currently, there is no vaccine to prevent coronavirus disease. Also, there are no medicines like antibiotics or antivirals to treat the virus. A person who becomes sick is given supportive care, which means rest and fluids. A person may also relieve his or her symptoms by using over-the-counter medicines that treat sneezing, coughing, and runny nose. These are the same medicines that a person takes for the common cold. If you develop symptoms, call your health care provider. People with severe symptoms may need hospital care. What can I do to protect myself and my family from this virus?     You can protect yourself and your family by taking the same actions that you would take to prevent the spread of other viruses.  Take the following actions:  Wash your hands often with soap and water for at least 20 seconds. If soap and water are not available, use alcohol-based hand sanitizer.  Avoid touching your face, mouth, nose, or eyes.  Cough or sneeze into a tissue, sleeve, or elbow. Do not cough or sneeze into your hand or the air. ? If you cough or sneeze into a tissue, throw it away immediately and wash your hands.  Disinfect objects and surfaces that you frequently touch every day.  Avoid close contact with people who are sick or have a fever or cough. Stay at least 3-6 ft (1-2 m) away from them, if possible.  Stay home if you are sick, except to get medical care. Call your health care provider before you get medical care.  Make sure your vaccines are up to date. Ask your health care provider what vaccines you need. What should I do if I need to travel? Follow travel recommendations from your local health authority, the CDC, and WHO. Travel information and advice  Centers for Disease Control and Prevention (CDC): BodyEditor.hu  World Health Organization [facility]): ThirdIncome.ca Know the risks and take action to protect your health  You are at higher risk of getting coronavirus disease if you are traveling to areas with an outbreak or if you are exposed to travelers from areas with an outbreak.  Wash your hands often and practice good hygiene to lower the risk of catching or spreading the virus. What should I do if I am sick? General instructions to stop the spread of infection  Wash your hands often with soap and water for at least 20 seconds. If soap and water are not available, use alcohol-based hand sanitizer.  Cough or sneeze into a  tissue, sleeve, or elbow. Do not cough or sneeze into your hand or the air. °· If you cough or sneeze into a tissue, throw it away immediately and wash your hands. °· Stay  home unless you must get medical care. Call your health care provider or local health authority before you get medical care. °· Avoid public areas. Do not take public transportation, if possible. °· If you can, wear a mask if you must go out of the house or if you are in close contact with someone who is not sick. °Keep your home clean °· Disinfect objects and surfaces that are frequently touched every day. This may include: °? Counters and tables. °? Doorknobs and light switches. °? Sinks and faucets. °? Electronics such as phones, remote controls, keyboards, computers, and tablets. °· Wash dishes in hot, soapy water or use a dishwasher. Air-dry your dishes. °· Wash laundry in hot water. °Prevent infecting other household members °· Let healthy household members care for children and pets, if possible. If you have to care for children or pets, wash your hands often and wear a mask. °· Sleep in a different bedroom or bed, if possible. °· Do not share personal items, such as razors, toothbrushes, deodorant, combs, brushes, towels, and washcloths. °Where to find more information °Centers for Disease Control and Prevention (CDC) °· Information and news updates: www.cdc.gov/coronavirus/2019-ncov °World Health Organization (WHO) °· Information and news updates: www.who.int/emergencies/diseases/novel-coronavirus-2019 °· Coronavirus health topic: www.who.int/health-topics/coronavirus °· Questions and answers on COVID-19: www.who.int/news-room/q-a-detail/q-a-coronaviruses °· Global tracker: who.sprinklr.com °American Academy of Pediatrics (AAP) °· Information for families: www.healthychildren.org/English/health-issues/conditions/chest-lungs/Pages/2019-Novel-Coronavirus.aspx °The coronavirus situation is changing rapidly. Check your local health authority website or the CDC and WHO websites for updates and news. °When should I contact a health care provider? °· Contact your health care provider if you have symptoms of an  infection, such as fever or cough, and you: °? Have been near anyone who is known to have coronavirus disease. °? Have come into contact with a person who is suspected to have coronavirus disease. °? Have traveled outside of the country. °When should I get emergency medical care? °· Get help right away by calling your local emergency services (911 in the [place] if you have: °? Trouble breathing. °? Pain or pressure in your chest. °? Confusion. °? Blue-tinged lips and fingernails. °? Difficulty waking from sleep. °? Symptoms that get worse. °Let the emergency medical personnel know if you think you have coronavirus disease. °Summary °· A new respiratory virus is spreading from person to person and causing COVID-19 (coronavirus disease). °· The virus that causes COVID-19 appears to spread easily. It spreads from one person to another through droplets from coughing or sneezing. °· Older adults and those with chronic diseases are at higher risk of disease. If you are at higher risk for complications, take extra precautions. °· There is currently no vaccine to prevent coronavirus disease. There are no medicines, such as antibiotics or antivirals, to treat the virus. °· You can protect yourself and your family by washing your hands often, avoiding touching your face, and covering your coughs and sneezes. °This information is not intended to replace advice given to you by your health care provider. Make sure you discuss any questions you have with your health care provider. °Document Released: 02/13/2019 Document Revised: 02/13/2019 Document Reviewed: 02/13/2019 °Elsevier Patient Education © 2020 [employer] ° °Prevent the Spread of COVID-19 if You Are Sick °If you are sick with COVID-19 or think you might have COVID-19, follow   the steps below to help protect other people in your home and community. °Stay home except to get medical care. °· Stay home. Most people with COVID-19 have mild illness and are able to recover at  home without medical care. Do not leave your home, except to get medical care. Do not visit public areas. °· Take care of yourself. Get rest and stay hydrated. °· Get medical care when needed. Call your doctor before you go to their office for care. But, if you have trouble breathing or other concerning symptoms, call 911 for immediate help. °· Avoid public transportation, ride-sharing, or taxis. °Separate yourself from other people and pets in your home. °· As much as possible, stay in a specific room and away from other people and pets in your home. Also, you should use a separate bathroom, if available. If you need to be around other people or animals in or outside of the home, wear a cloth face covering. °? See COVID-19 and Animals if you have questions about pets: https://www.cdc.gov/coronavirus/2019-ncov/faq.html#COVID19animals °Monitor your symptoms. °· Common symptoms of COVID-19 include fever and cough. Trouble breathing is a more serious symptom that means you should get medical attention. °· Follow care instructions from your healthcare provider and local health department. Your local health authorities will give instructions on checking your symptoms and reporting information. °If you develop emergency warning signs for COVID-19 get medical attention immediately.  °Emergency warning signs include*: °· Trouble breathing °· Persistent pain or pressure in the chest °· New confusion or not able to be woken °· Bluish lips or face °*This list is not all inclusive. Please consult your medical provider for any other symptoms that are severe or concerning to you. °Call 911 if you have a medical emergency. If you have a medical emergency and need to call 911, notify the operator that you have or think you might have, COVID-19. If possible, put on a facemask before medical help arrives. °Call ahead before visiting your doctor. °· Call ahead. Many medical visits for routine care are being postponed or done by phone  or telemedicine. °· If you have a medical appointment that cannot be postponed, call your doctor's office. This will help the office protect themselves and other patients. °If you are sick, wear a cloth covering over your nose and mouth. °· You should wear a cloth face covering over your nose and mouth if you must be around other people or animals, including pets (even at home). °· You don't need to wear the cloth face covering if you are alone. If you can't put on a cloth face covering (because of trouble breathing for example), cover your coughs and sneezes in some other way. Try to stay at least 6 feet away from other people. This will help protect the people around you. °Note: During the COVID-19 pandemic, medical grade facemasks are reserved for healthcare workers and some first responders. You may need to make a cloth face covering using a scarf or bandana. °Cover your coughs and sneezes. °· Cover your mouth and nose with a tissue when you cough or sneeze. °· Throw used tissues in a lined trash can. °· Immediately wash your hands with soap and water for at least 20 seconds. If soap and water are not available, clean your hands with an alcohol-based hand sanitizer that contains at least 60% alcohol. °Clean your hands often. °· Wash your hands often with soap and water for at least 20 seconds. This is especially important after blowing your   nose, coughing, or sneezing; going to the bathroom; and before eating or preparing food. °· Use hand sanitizer if soap and water are not available. Use an alcohol-based hand sanitizer with at least 60% alcohol, covering all surfaces of your hands and rubbing them together until they feel dry. °· Soap and water are the best option, especially if your hands are visibly dirty. °· Avoid touching your eyes, nose, and mouth with unwashed hands. °Avoid sharing personal household items. °· Do not share dishes, drinking glasses, cups, eating utensils, towels, or bedding with other  people in your home. °· Wash these items thoroughly after using them with soap and water or put them in the dishwasher. °Clean all "high-touch" surfaces everyday. °· Clean and disinfect high-touch surfaces in your "sick room" and bathroom. Let someone else clean and disinfect surfaces in common areas, but not your bedroom and bathroom. °· If a caregiver or other person needs to clean and disinfect a sick person's bedroom or bathroom, they should do so on an as-needed basis. The caregiver/other person should wear a mask and wait as long as possible after the sick person has used the bathroom. °High-touch surfaces include phones, remote controls, counters, tabletops, doorknobs, bathroom fixtures, toilets, keyboards, tablets, and bedside tables. °· Clean and disinfect areas that may have blood, stool, or body fluids on them. °· Use household cleaners and disinfectants. Clean the area or item with soap and water or another detergent if it is dirty. Then use a household disinfectant. °? Be sure to follow the instructions on the label to ensure safe and effective use of the product. Many products recommend keeping the surface wet for several minutes to ensure germs are killed. Many also recommend precautions such as wearing gloves and making sure you have good ventilation during use of the product. °? Most EPA-registered household disinfectants should be effective. °How to discontinue home isolation °· People with COVID-19 who have stayed home (home isolated) can stop home isolation under the following conditions: °? If you will not have a test to determine if you are still contagious, you can leave home after these three things have happened: °§ You have had no fever for at least 72 hours (that is three full days of no fever without the use of medicine that reduces fevers) AND °§ other symptoms have improved (for example, when your cough or shortness of breath has improved) AND °§ at least 10 days have passed since your  symptoms first appeared. °? If you will be tested to determine if you are still contagious, you can leave home after these three things have happened: °§ You no longer have a fever (without the use of medicine that reduces fevers) AND °§ other symptoms have improved (for example, when your cough or shortness of breath has improved) AND °§ you received two negative tests in a row, 24 hours apart. Your doctor will follow CDC guidelines. °In all cases, follow the guidance of your healthcare provider and local health department. The decision to stop home isolation should be made in consultation with your healthcare provider and state and local health departments. Local decisions depend on local circumstances. °cdc.gov/coronavirus °03/04/2019 °This information is not intended to replace advice given to you by your health care provider. Make sure you discuss any questions you have with your health care provider. °Document Released: 02/13/2019 Document Revised: 03/14/2019 Document Reviewed: 02/13/2019 °Elsevier Patient Education © 2020 Elsevier Inc. ° °

## 2019-12-19 ENCOUNTER — Ambulatory Visit (INDEPENDENT_AMBULATORY_CARE_PROVIDER_SITE_OTHER): Payer: PPO | Admitting: Nurse Practitioner

## 2019-12-19 ENCOUNTER — Other Ambulatory Visit: Payer: Self-pay

## 2019-12-19 ENCOUNTER — Encounter: Payer: Self-pay | Admitting: Family Medicine

## 2019-12-19 VITALS — BP 130/72 | HR 82 | Temp 98.7°F | Resp 16 | Ht 70.5 in | Wt 250.0 lb

## 2019-12-19 DIAGNOSIS — E1142 Type 2 diabetes mellitus with diabetic polyneuropathy: Secondary | ICD-10-CM

## 2019-12-19 DIAGNOSIS — E782 Mixed hyperlipidemia: Secondary | ICD-10-CM | POA: Diagnosis not present

## 2019-12-19 DIAGNOSIS — R748 Abnormal levels of other serum enzymes: Secondary | ICD-10-CM

## 2019-12-19 DIAGNOSIS — I119 Hypertensive heart disease without heart failure: Secondary | ICD-10-CM

## 2019-12-19 DIAGNOSIS — Z6835 Body mass index (BMI) 35.0-35.9, adult: Secondary | ICD-10-CM | POA: Diagnosis not present

## 2019-12-19 DIAGNOSIS — E66812 Obesity, class 2: Secondary | ICD-10-CM

## 2019-12-19 DIAGNOSIS — R300 Dysuria: Secondary | ICD-10-CM

## 2019-12-19 DIAGNOSIS — E119 Type 2 diabetes mellitus without complications: Secondary | ICD-10-CM | POA: Diagnosis not present

## 2019-12-19 LAB — POCT UA - MICROALBUMIN: Microalbumin Ur, POC: 150 mg/L

## 2019-12-19 NOTE — Progress Notes (Signed)
Established Patient Office Visit  Subjective:  Patient ID: Andre Shaffer, male    DOB: July 21, 1941  Age: 79 y.o. MRN: [id number]  CC: Patietnt  is a 79 year old male. He is here for 4 months f/u  The patient's medications were reviewed and reconciled since the patient's last visit.  History details were provided by the patient. The history appears to be reliable.   Chief Complaint  Patient presents with  . Diabetes  . Hyperlipidemia    HPI Andre Shaffer presents for The patient presents with history of type 2 diabetes mellitus with neuropathy. Patient was diagnosed in 18 yrs ago. Compliance with treatment has been good; the patient takes medication as directed , maintains a diabetic diet and an exercise regimen , follows up as directed , and is keeping a glucose diary. Sugars run around 130-140s.  Patient specifically denies associated symptoms, including blurred vision, fatigue, polydipsia, polyphagia and polyuria . Patient denies hypoglycemia. In regard to preventative care, the patient performs foot self-exams daily and last ophthalmology exam was 2019  Mixed hyperlipidemia  Pt presents with hyperlipidemia Patient was diagnosed about 18-20 years ago. Compliance with treatment has been good. The patient is compliant with medications, maintains a low cholesterol diet , follows up as directed , and maintains an exercise regimen . The patient denies experiencing any hypercholesterolemia related symptoms.  Pt presents for follow up of hypertensive heart disease without CHF. Patient was diagnosed several years ago. The patient is tolerating the medication well without side effects. Compliance with treatment has been good; including taking medication as directed, maintains a healthy diet and regular exercise regimen , and following up as directed.  Patient has history of elevated LFTs. She has had a very thorough work up which was negative and discontinued statin as the only other cause.  Past  Medical History:  Diagnosis Date  . Abnormal liver function   . Arthritis   . Atherosclerotic heart disease of native coronary artery without angina pectoris   . Diabetes mellitus without complication ([place])   . GERD (gastroesophageal reflux disease)   . Hyperlipemia   . Hypertension   . MI (myocardial infarction) [facility])     Past Surgical History:  Procedure Laterality Date  . ANKLE SURGERY  93,03   rt and lt   . CHOLECYSTECTOMY  10/19/2012   Procedure: LAPAROSCOPIC CHOLECYSTECTOMY WITH INTRAOPERATIVE CHOLANGIOGRAM;  Surgeon: Ralene Ok, MD;  Location: [place];  Service: General;  Laterality: N/A;  . COLONOSCOPY    . CORONARY ARTERY BYPASS GRAFT  1997  . TIBIA FRACTURE SURGERY      History reviewed. No pertinent family history.  Social History   Socioeconomic History  . Marital status: Married    Spouse name: Not on file  . Number of children: Not on file  . Years of education: Not on file  . Highest education level: Not on file  Occupational History  . Not on file  Tobacco Use  . Smoking status: Former Smoker    Quit date: 10/19/1975    Years since quitting: 44.1  . Smokeless tobacco: Never Used  Substance and Sexual Activity  . Alcohol use: No  . Drug use: No  . Sexual activity: Not on file  Other Topics Concern  . Not on file  Social History Narrative  . Not on file   Social Determinants of Health   Financial Resource Strain:   . Difficulty of Paying Living Expenses: Not on file  Food  Insecurity:   . Worried About Charity fundraiser in the Last Year: Not on file  . Ran Out of Food in the Last Year: Not on file  Transportation Needs:   . Lack of Transportation (Medical): Not on file  . Lack of Transportation (Non-Medical): Not on file  Physical Activity:   . Days of Exercise per Week: Not on file  . Minutes of Exercise per Session: Not on file  Stress:   . Feeling of Stress : Not on file  Social Connections:   . Frequency of  Communication with Friends and Family: Not on file  . Frequency of Social Gatherings with Friends and Family: Not on file  . Attends Religious Services: Not on file  . Active Member of Clubs or Organizations: Not on file  . Attends Archivist Meetings: Not on file  . Marital Status: Not on file  Intimate Partner Violence:   . Fear of Current or Ex-Partner: Not on file  . Emotionally Abused: Not on file  . Physically Abused: Not on file  . Sexually Abused: Not on file    Outpatient Medications Prior to Visit  Medication Sig Dispense Refill  . aspirin EC 325 MG tablet Take 325 mg by mouth daily.    Marland Kitchen ibuprofen (ADVIL,MOTRIN) 200 MG tablet Take 200 mg by mouth every 8 (eight) hours as needed for moderate pain.    Marland Kitchen insulin lispro protamine-lispro (HUMALOG 75/25 MIX) (75-25) 100 UNIT/ML SUSP injection Inject into the skin 2 (two) times daily with a meal. 45 U  Q AM  AND  25 U Q PM    . metFORMIN (GLUCOPHAGE) 1000 MG tablet Take 1,000 mg by mouth 2 (two) times daily with a meal.    . metoprolol succinate (TOPROL-XL) 25 MG 24 hr tablet Take 25 mg by mouth daily at 12 noon.     Marland Kitchen telmisartan (MICARDIS) 80 MG tablet Take 80 mg by mouth daily.    . vitamin B-12 (CYANOCOBALAMIN) 250 MCG tablet Take 250 mcg by mouth daily.    Marland Kitchen gabapentin (NEURONTIN) 300 MG capsule Take 300 mg by mouth 2 (two) times daily.     Marland Kitchen atorvastatin (LIPITOR) 40 MG tablet Take 40 mg by mouth every evening.    . diazepam (VALIUM) 2 MG tablet Take 2 mg by mouth every 12 (twelve) hours as needed for anxiety (uses for closed tests, (MRI)).     Marland Kitchen glimepiride (AMARYL) 4 MG tablet Take 4 mg by mouth daily with breakfast.     . losartan (COZAAR) 50 MG tablet Take 50 mg by mouth daily at 12 noon.     No facility-administered medications prior to visit.    Allergies  Allergen Reactions  . Nitroglycerin Other (See Comments)    Blood pressure 'bottomed out'  . Tetanus Toxoid Other (See Comments)    Severe flu symptoms  fever and chills  . Penicillins Rash    ROS Review of Systems  Constitutional: Negative.   HENT: Negative.   Eyes: Negative.   Respiratory: Negative.   Cardiovascular: Negative.   Gastrointestinal: Negative.   Endocrine: Negative.   Genitourinary: Positive for dysuria.  Skin: Negative.   Neurological: Negative.   Hematological: Negative.   Psychiatric/Behavioral: Negative.       Objective:    Physical Exam  Constitutional: He appears well-developed and well-nourished.  HENT:  Head: Normocephalic.  Right Ear: External ear normal.  Left Ear: External ear normal.  Eyes: Pupils are equal, round, and reactive  to light.  Cardiovascular: Normal rate.  Pulmonary/Chest: Effort normal and breath sounds normal. No respiratory distress. He exhibits no tenderness.  Abdominal: Soft. Bowel sounds are normal.  Musculoskeletal:        General: No tenderness.     Cervical back: Normal range of motion and neck supple.  Neurological: He is alert.  Skin: Skin is dry.  Psychiatric: He has a normal mood and affect.    BP 130/72 (BP Location: Left Arm, Patient Position: Sitting, Cuff Size: Large)   Pulse 82   Temp 98.7 F (37.1 C)   Resp 16   Ht 5' 10.5" (1.791 m)   Wt 250 lb (113.4 kg)   BMI 35.36 kg/m  Wt Readings from Last 3 Encounters:  12/19/19 250 lb (113.4 kg)  06/10/14 264 lb (119.7 kg)  05/08/14 266 lb (120.7 kg)     Health Maintenance Due  Topic Date Due  . HEMOGLOBIN A1C  08-16-1941  . FOOT EXAM  07/17/1949  . OPHTHALMOLOGY EXAM  07/17/1949  . TETANUS/TDAP  07/17/1958  . PNA vac Low Risk Adult (1 of 2 - PCV13) 07/17/2004    There are no preventive care reminders to display for this patient.  No results found for: TSH Lab Results  Component Value Date   WBC 4.8 04/19/2014   HGB 15.3 04/19/2014   HCT 45.0 04/19/2014   MCV 91.8 04/19/2014   PLT 109 (L) 04/19/2014   Lab Results  Component Value Date   NA 139 04/19/2014   K 4.9 04/19/2014   CO2 24  10/14/2012   GLUCOSE 257 (H) 04/19/2014   BUN 16 04/19/2014   CREATININE 1.20 04/19/2014   BILITOT 1.9 (H) 10/14/2012   ALKPHOS 447 (H) 10/14/2012   AST 122 (H) 10/14/2012   ALT 191 (H) 10/14/2012   PROT 7.5 10/14/2012   ALBUMIN 3.4 (L) 10/14/2012   CALCIUM 9.9 10/14/2012   No results found for: CHOL No results found for: HDL No results found for: LDLCALC No results found for: TRIG No results found for: CHOLHDL No results found for: HGBA1C    Assessment & Plan:  1. Diabetic polyneuropathy associated with type 2 diabetes mellitus ([place]) Fairly well controlled.  No changes to medicines.  Continue to work on eating a healthy diet and exercise.  Labs drawn today. Check urine microalbumin.Continue to check sugars daily. Continue to check feet daily.   2. Mixed hyperlipidemia  Continue to work on eating a healthy diet and exercise.  Labs drawn today.  - Comprehensive metabolic panel - CBC with Differential/Platelet  3. Dysuria Check UA and Urine culture.   4. 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 Continue to work on eating a healthy diet and exercise.  Labs drawn today.  - Comprehensive metabolic panel - CBC with Differential/Platelet - check tsh  5. Elevated liver enzymes Check CMP.  6. Obesity with bmi 35 with comorbidities. Low fat diet, low sugar diet, and exercise.  Follow-up: Return in about 6 months (around 06/17/2020).    Ivy Lynn, NP

## 2019-12-19 NOTE — Patient Instructions (Addendum)
Pt is well managed on current medication therapy. No changes to diabetic medication. UA collected for dysuria, Patient advised to increase fluid intake. micro albumin done 4 months f/u labs obtained.  F/u 6 months     Dysuria Dysuria is pain or discomfort while urinating. The pain or discomfort may be felt in the part of your body that drains urine from the bladder (urethra) or in the surrounding tissue of the genitals. The pain may also be felt in the groin area, lower abdomen, or lower back. You may have to urinate frequently or have the sudden feeling that you have to urinate (urgency). Dysuria can affect both men and women, but it is more common in women. Dysuria can be caused by many different things, including:  Urinary tract infection.  Kidney stones or bladder stones.  Certain sexually transmitted infections (STIs), such as chlamydia.  Dehydration.  Inflammation of the tissues of the vagina.  Use of certain medicines.  Use of certain soaps or scented products that cause irritation. Follow these instructions at home: General instructions  Watch your condition for any changes.  Urinate often. Avoid holding urine for long periods of time.  After a bowel movement or urination, women should cleanse from front to back, using each tissue only once.  Urinate after sexual intercourse.  Keep all follow-up visits as told by your health care provider. This is important.  If you had any tests done to find the cause of dysuria, it is up to you to get your test results. Ask your health care provider, or the department that is doing the test, when your results will be ready. Eating and drinking   Drink enough fluid to keep your urine pale yellow.  Avoid caffeine, tea, and alcohol. They can irritate the bladder and make dysuria worse. In men, alcohol may irritate the prostate. Medicines  Take over-the-counter and prescription medicines only as told by your health care  provider.  If you were prescribed an antibiotic medicine, take it as told by your health care provider. Do not stop taking the antibiotic even if you start to feel better. Contact a health care provider if:  You have a fever.  You develop pain in your back or sides.  You have nausea or vomiting.  You have blood in your urine.  You are not urinating as often as you usually do. Get help right away if:  Your pain is severe and not relieved with medicines.  You cannot eat or drink without vomiting.  You are confused.  You have a rapid heartbeat while at rest.  You have shaking or chills.  You feel extremely weak. Summary  Dysuria is pain or discomfort while urinating. Many different conditions can lead to dysuria.  If you have dysuria, you may have to urinate frequently or have the sudden feeling that you have to urinate (urgency).  Watch your condition for any changes. Keep all follow-up visits as told by your health care provider.  Make sure that you urinate often and drink enough fluid to keep your urine pale yellow. This information is not intended to replace advice given to you by your health care provider. Make sure you discuss any questions you have with your health care provider. Document Revised: 09/30/2017 Document Reviewed: 08/04/2017 Elsevier Patient Education  [place]. No changes, Recommendation: continue diet and exercise. F/u 6  Months  Diabetes Basics  Diabetes (diabetes mellitus) is a long-term (chronic) disease. It occurs when the body does not properly  use sugar (glucose) that is released from food after you eat. Diabetes may be caused by one or both of these problems:  Your pancreas does not make enough of a hormone called insulin.  Your body does not react in a normal way to insulin that it makes. Insulin lets sugars (glucose) go into cells in your body. This gives you energy. If you have diabetes, sugars cannot get into cells. This causes  high blood sugar (hyperglycemia). Follow these instructions at home: How is diabetes treated? You may need to take insulin or other diabetes medicines daily to keep your blood sugar in balance. Take your diabetes medicines every day as told by your doctor. List your diabetes medicines here: Diabetes medicines  Name of medicine: ______________________________ ? Amount (dose): _______________ Time (a.m./p.m.): _______________ Notes: ___________________________________  Name of medicine: ______________________________ ? Amount (dose): _______________ Time (a.m./p.m.): _______________ Notes: ___________________________________  Name of medicine: ______________________________ ? Amount (dose): _______________ Time (a.m./p.m.): _______________ Notes: ___________________________________ If you use insulin, you will learn how to give yourself insulin by injection. You may need to adjust the amount based on the food that you eat. List the types of insulin you use here: Insulin  Insulin type: ______________________________ ? Amount (dose): _______________ Time (a.m./p.m.): _______________ Notes: ___________________________________  Insulin type: ______________________________ ? Amount (dose): _______________ Time (a.m./p.m.): _______________ Notes: ___________________________________  Insulin type: ______________________________ ? Amount (dose): _______________ Time (a.m./p.m.): _______________ Notes: ___________________________________  Insulin type: ______________________________ ? Amount (dose): _______________ Time (a.m./p.m.): _______________ Notes: ___________________________________  Insulin type: ______________________________ ? Amount (dose): _______________ Time (a.m./p.m.): _______________ Notes: ___________________________________ How do I manage my blood sugar?  Check your blood sugar levels using a blood glucose monitor as directed by your doctor. Your doctor will set treatment  goals for you. Generally, you should have these blood sugar levels:  Before meals (preprandial): 80-130 mg/dL (4.4-7.2 mmol/L).  After meals (postprandial): below 180 mg/dL (10 mmol/L).  A1c level: less than 7%. Write down the times that you will check your blood sugar levels: Blood sugar checks  Time: _______________ Notes: ___________________________________  Time: _______________ Notes: ___________________________________  Time: _______________ Notes: ___________________________________  Time: _______________ Notes: ___________________________________  Time: _______________ Notes: ___________________________________  Time: _______________ Notes: ___________________________________  What do I need to know about low blood sugar? Low blood sugar is called hypoglycemia. This is when blood sugar is at or below 70 mg/dL (3.9 mmol/L). Symptoms may include:  Feeling: ? Hungry. ? Worried or nervous (anxious). ? Sweaty and clammy. ? Confused. ? Dizzy. ? Sleepy. ? Sick to your stomach (nauseous).  Having: ? A fast heartbeat. ? A headache. ? A change in your vision. ? Tingling or no feeling (numbness) around the mouth, lips, or tongue. ? Jerky movements that you cannot control (seizure).  Having trouble with: ? Moving (coordination). ? Sleeping. ? Passing out (fainting). ? Getting upset easily (irritability). Treating low blood sugar To treat low blood sugar, eat or drink something sugary right away. If you can think clearly and swallow safely, follow the 15:15 rule:  Take 15 grams of a fast-acting carb (carbohydrate). Talk with your doctor about how much you should take.  Some fast-acting carbs are: ? Sugar tablets (glucose pills). Take 3-4 glucose pills. ? 6-8 pieces of hard candy. ? 4-6 oz (120-150 mL) of fruit juice. ? 4-6 oz (120-150 mL) of regular (not diet) soda. ? 1 Tbsp (15 mL) honey or sugar.  Check your blood sugar 15 minutes after you take the carb.  If  your blood sugar is still  at or below 70 mg/dL (3.9 mmol/L), take 15 grams of a carb again.  If your blood sugar does not go above 70 mg/dL (3.9 mmol/L) after 3 tries, get help right away.  After your blood sugar goes back to normal, eat a meal or a snack within 1 hour. Treating very low blood sugar If your blood sugar is at or below 54 mg/dL (3 mmol/L), you have very low blood sugar (severe hypoglycemia). This is an emergency. Do not wait to see if the symptoms will go away. Get medical help right away. Call your local emergency services (911 in the [place] Do not drive yourself to the hospital. Questions to ask your health care provider  Do I need to meet with a diabetes educator?  What equipment will I need to care for myself at home?  What diabetes medicines do I need? When should I take them?  How often do I need to check my blood sugar?  What number can I call if I have questions?  When is my next doctor's visit?  Where can I find a support group for people with diabetes? Where to find more information  American Diabetes Association: www.diabetes.org  American Association of Diabetes Educators: www.diabeteseducator.org/patient-resources Contact a doctor if:  Your blood sugar is at or above 240 mg/dL (13.3 mmol/L) for 2 days in a row.  You have been sick or have had a fever for 2 days or more, and you are not getting better.  You have any of these problems for more than 6 hours: ? You cannot eat or drink. ? You feel sick to your stomach (nauseous). ? You throw up (vomit). ? You have watery poop (diarrhea). Get help right away if:  Your blood sugar is lower than 54 mg/dL (3 mmol/L).  You get confused.  You have trouble: ? Thinking clearly. ? Breathing. Summary  Diabetes (diabetes mellitus) is a long-term (chronic) disease. It occurs when the body does not properly use sugar (glucose) that is released from food after digestion.  Take insulin and diabetes medicines  as told.  Check your blood sugar every day, as often as told.  Keep all follow-up visits as told by your doctor. This is important. This information is not intended to replace advice given to you by your health care provider. Make sure you discuss any questions you have with your health care provider. Document Revised: 07/11/2019 Document Reviewed: 01/20/2018 Elsevier Patient Education  [place].

## 2019-12-19 NOTE — Assessment & Plan Note (Signed)
UA negative for infection, advised pt to increase fluid. Call if symptoms  Continue

## 2019-12-20 LAB — COMPREHENSIVE METABOLIC PANEL WITH GFR
ALT: 195 IU/L — ABNORMAL HIGH (ref 0–44)
AST: 100 IU/L — ABNORMAL HIGH (ref 0–40)
Albumin/Globulin Ratio: 1.7 (ref 1.2–2.2)
Albumin: 4.1 g/dL (ref 3.7–4.7)
Alkaline Phosphatase: 284 IU/L — ABNORMAL HIGH (ref 39–117)
BUN/Creatinine Ratio: 20 (ref 10–24)
BUN: 22 mg/dL (ref 8–27)
Bilirubin Total: 0.6 mg/dL (ref 0.0–1.2)
CO2: 22 mmol/L (ref 20–29)
Calcium: 9.6 mg/dL (ref 8.6–10.2)
Chloride: 106 mmol/L (ref 96–106)
Creatinine, Ser: 1.09 mg/dL (ref 0.76–1.27)
GFR calc Af Amer: 74 mL/min/1.73
GFR calc non Af Amer: 64 mL/min/1.73
Globulin, Total: 2.4 g/dL (ref 1.5–4.5)
Glucose: 105 mg/dL — ABNORMAL HIGH (ref 65–99)
Potassium: 5 mmol/L (ref 3.5–5.2)
Sodium: 144 mmol/L (ref 134–144)
Total Protein: 6.5 g/dL (ref 6.0–8.5)

## 2019-12-20 LAB — CBC WITH DIFFERENTIAL/PLATELET
Basophils Absolute: 0.1 10*3/uL (ref 0.0–0.2)
Basos: 1 %
EOS (ABSOLUTE): 0.4 10*3/uL (ref 0.0–0.4)
Eos: 6 %
Hematocrit: 43.4 % (ref 37.5–51.0)
Hemoglobin: 14.8 g/dL (ref 13.0–17.7)
Immature Grans (Abs): 0 10*3/uL (ref 0.0–0.1)
Immature Granulocytes: 0 %
Lymphocytes Absolute: 1.4 10*3/uL (ref 0.7–3.1)
Lymphs: 21 %
MCH: 32.1 pg (ref 26.6–33.0)
MCHC: 34.1 g/dL (ref 31.5–35.7)
MCV: 94 fL (ref 79–97)
Monocytes Absolute: 0.5 10*3/uL (ref 0.1–0.9)
Monocytes: 7 %
Neutrophils Absolute: 4.2 10*3/uL (ref 1.4–7.0)
Neutrophils: 65 %
Platelets: 150 10*3/uL (ref 150–450)
RBC: 4.61 x10E6/uL (ref 4.14–5.80)
RDW: 13.4 % (ref 11.6–15.4)
WBC: 6.6 10*3/uL (ref 3.4–10.8)

## 2019-12-20 LAB — URINE CULTURE: Organism ID, Bacteria: NO GROWTH

## 2019-12-20 LAB — TSH: TSH: 3.95 u[IU]/mL (ref 0.450–4.500)

## 2019-12-25 ENCOUNTER — Other Ambulatory Visit: Payer: Self-pay

## 2019-12-25 DIAGNOSIS — E119 Type 2 diabetes mellitus without complications: Secondary | ICD-10-CM

## 2019-12-25 DIAGNOSIS — R945 Abnormal results of liver function studies: Secondary | ICD-10-CM

## 2019-12-25 MED ORDER — INSULIN LISPRO PROT & LISPRO (75-25 MIX) 100 UNIT/ML ~~LOC~~ SUSP: 100.0000 [IU] | Freq: Two times a day (BID) | SUBCUTANEOUS | 0 refills | Status: DC

## 2019-12-26 LAB — HEMOGLOBIN A1C: A1c: 6.8

## 2019-12-27 ENCOUNTER — Telehealth: Payer: Self-pay

## 2019-12-27 NOTE — Telephone Encounter (Signed)
PA submitted and approved via covermymeds.  [id number]

## 2020-01-01 ENCOUNTER — Telehealth: Payer: Self-pay

## 2020-01-01 NOTE — Telephone Encounter (Signed)
PA for Humalog 75/25 submitted and approved via covermymeds approved.

## 2020-01-02 ENCOUNTER — Other Ambulatory Visit: Payer: Self-pay | Admitting: Family Medicine

## 2020-01-02 DIAGNOSIS — R7989 Other specified abnormal findings of blood chemistry: Secondary | ICD-10-CM

## 2020-01-03 LAB — COMP. METABOLIC PANEL (12)
AST: 94 IU/L — ABNORMAL HIGH (ref 0–40)
Albumin/Globulin Ratio: 1.5 (ref 1.2–2.2)
Albumin: 3.8 g/dL (ref 3.7–4.7)
Alkaline Phosphatase: 280 IU/L — ABNORMAL HIGH (ref 39–117)
BUN/Creatinine Ratio: 14 (ref 10–24)
BUN: 15 mg/dL (ref 8–27)
Bilirubin Total: 0.4 mg/dL (ref 0.0–1.2)
Calcium: 9.8 mg/dL (ref 8.6–10.2)
Chloride: 108 mmol/L — ABNORMAL HIGH (ref 96–106)
Creatinine, Ser: 1.08 mg/dL (ref 0.76–1.27)
GFR calc Af Amer: 75 mL/min/{1.73_m2} (ref >59)
GFR calc non Af Amer: 64 mL/min/{1.73_m2} (ref >59)
Globulin, Total: 2.5 g/dL (ref 1.5–4.5)
Glucose: 115 mg/dL — ABNORMAL HIGH (ref 65–99)
Potassium: 5.3 mmol/L — ABNORMAL HIGH (ref 3.5–5.2)
Sodium: 145 mmol/L — ABNORMAL HIGH (ref 134–144)
Total Protein: 6.3 g/dL (ref 6.0–8.5)

## 2020-01-07 LAB — HGB A1C W/O EAG: Hgb A1c MFr Bld: 6.8 % — ABNORMAL HIGH (ref 4.8–5.6)

## 2020-01-10 ENCOUNTER — Telehealth: Payer: Self-pay

## 2020-01-10 NOTE — Telephone Encounter (Signed)
-----   Message from Rochel Brome, MD sent at 01/08/2020  7:47 AM EST ----- Regarding: Liver enzymes abnormal Ollis's liver enzymes were still up. Repeat in 1 month. Offer referral back to GI (dr.butler), since they have gone back up. kc

## 2020-01-10 NOTE — Telephone Encounter (Signed)
Patient notified of results.  He prefers to recheck cmp in 1 month.

## 2020-01-15 DIAGNOSIS — L570 Actinic keratosis: Secondary | ICD-10-CM | POA: Diagnosis not present

## 2020-01-15 DIAGNOSIS — L820 Inflamed seborrheic keratosis: Secondary | ICD-10-CM | POA: Diagnosis not present

## 2020-01-15 DIAGNOSIS — L821 Other seborrheic keratosis: Secondary | ICD-10-CM | POA: Diagnosis not present

## 2020-01-25 DIAGNOSIS — E6609 Other obesity due to excess calories: Secondary | ICD-10-CM | POA: Insufficient documentation

## 2020-01-25 DIAGNOSIS — Z6833 Body mass index (BMI) 33.0-33.9, adult: Secondary | ICD-10-CM | POA: Insufficient documentation

## 2020-01-25 DIAGNOSIS — R748 Abnormal levels of other serum enzymes: Secondary | ICD-10-CM | POA: Insufficient documentation

## 2020-01-25 DIAGNOSIS — R7401 Elevation of levels of liver transaminase levels: Secondary | ICD-10-CM | POA: Insufficient documentation

## 2020-01-25 DIAGNOSIS — I119 Hypertensive heart disease without heart failure: Secondary | ICD-10-CM | POA: Insufficient documentation

## 2020-02-07 ENCOUNTER — Telehealth: Payer: Self-pay | Admitting: Family Medicine

## 2020-02-07 NOTE — Progress Notes (Signed)
  Chronic Care Management   Note  02/07/2020 Name: Andre Shaffer MRN: [id number] DOB: 1941/03/23  Andre Shaffer is a 79 y.o. year old male who is a primary care patient of Cox, Kirsten, MD. I reached out to Andre Shaffer by phone today in response to a referral sent by Andre Shaffer PCP, Cox, Kirsten, MD.   Andre Shaffer was given information about Chronic Care Management services today including:  1. CCM service includes personalized support from designated clinical staff supervised by his physician, including individualized plan of care and coordination with other care providers 2. 24/7 contact phone numbers for assistance for urgent and routine care needs. 3. Service will only be billed when office clinical staff spend 20 minutes or more in a month to coordinate care. 4. Only one practitioner may furnish and bill the service in a calendar month. 5. The patient may stop CCM services at any time (effective at the end of the month) by phone call to the office staff.   Patient did not agree to enrollment in care management services and does not wish to consider at this time.  Follow up plan:   Earney [place] [employer] Scheduler

## 2020-02-12 ENCOUNTER — Other Ambulatory Visit: Payer: Self-pay | Admitting: Family Medicine

## 2020-02-19 ENCOUNTER — Other Ambulatory Visit: Payer: Self-pay

## 2020-02-19 ENCOUNTER — Other Ambulatory Visit: Payer: BC Managed Care – PPO

## 2020-02-19 DIAGNOSIS — R945 Abnormal results of liver function studies: Secondary | ICD-10-CM | POA: Diagnosis not present

## 2020-02-19 LAB — COMPREHENSIVE METABOLIC PANEL
ALT: 39 IU/L (ref 0–44)
AST: 28 IU/L (ref 0–40)
Albumin/Globulin Ratio: 1.7 (ref 1.2–2.2)
Albumin: 4.4 g/dL (ref 3.7–4.7)
Alkaline Phosphatase: 195 IU/L — ABNORMAL HIGH (ref 39–117)
BUN/Creatinine Ratio: 18 (ref 10–24)
BUN: 23 mg/dL (ref 8–27)
Bilirubin Total: 0.5 mg/dL (ref 0.0–1.2)
CO2: 20 mmol/L (ref 20–29)
Calcium: 10 mg/dL (ref 8.6–10.2)
Chloride: 101 mmol/L (ref 96–106)
Creatinine, Ser: 1.28 mg/dL — ABNORMAL HIGH (ref 0.76–1.27)
GFR calc Af Amer: 61 mL/min/{1.73_m2} (ref >59)
GFR calc non Af Amer: 53 mL/min/{1.73_m2} — ABNORMAL LOW (ref >59)
Globulin, Total: 2.6 g/dL (ref 1.5–4.5)
Glucose: 273 mg/dL — ABNORMAL HIGH (ref 65–99)
Potassium: 5.4 mmol/L — ABNORMAL HIGH (ref 3.5–5.2)
Sodium: 137 mmol/L (ref 134–144)
Total Protein: 7 g/dL (ref 6.0–8.5)

## 2020-02-21 ENCOUNTER — Other Ambulatory Visit: Payer: Self-pay

## 2020-02-21 DIAGNOSIS — R945 Abnormal results of liver function studies: Secondary | ICD-10-CM

## 2020-02-21 DIAGNOSIS — N289 Disorder of kidney and ureter, unspecified: Secondary | ICD-10-CM

## 2020-02-21 NOTE — Progress Notes (Signed)
cmp

## 2020-03-19 ENCOUNTER — Ambulatory Visit: Payer: BC Managed Care – PPO

## 2020-03-21 ENCOUNTER — Other Ambulatory Visit: Payer: Self-pay | Admitting: Family Medicine

## 2020-03-21 DIAGNOSIS — I119 Hypertensive heart disease without heart failure: Secondary | ICD-10-CM | POA: Diagnosis not present

## 2020-03-21 DIAGNOSIS — E1142 Type 2 diabetes mellitus with diabetic polyneuropathy: Secondary | ICD-10-CM

## 2020-03-21 DIAGNOSIS — E782 Mixed hyperlipidemia: Secondary | ICD-10-CM

## 2020-03-21 DIAGNOSIS — I10 Essential (primary) hypertension: Secondary | ICD-10-CM

## 2020-03-22 LAB — COMPREHENSIVE METABOLIC PANEL
ALT: 20 IU/L (ref 0–44)
AST: 23 IU/L (ref 0–40)
Albumin/Globulin Ratio: 1.7 (ref 1.2–2.2)
Albumin: 4.4 g/dL (ref 3.7–4.7)
Alkaline Phosphatase: 123 IU/L — ABNORMAL HIGH (ref 48–121)
BUN/Creatinine Ratio: 14 (ref 10–24)
BUN: 19 mg/dL (ref 8–27)
Bilirubin Total: 0.6 mg/dL (ref 0.0–1.2)
CO2: 23 mmol/L (ref 20–29)
Calcium: 9.9 mg/dL (ref 8.6–10.2)
Chloride: 105 mmol/L (ref 96–106)
Creatinine, Ser: 1.36 mg/dL — ABNORMAL HIGH (ref 0.76–1.27)
GFR calc Af Amer: 56 mL/min/{1.73_m2} — ABNORMAL LOW (ref >59)
GFR calc non Af Amer: 49 mL/min/{1.73_m2} — ABNORMAL LOW (ref >59)
Globulin, Total: 2.6 g/dL (ref 1.5–4.5)
Glucose: 104 mg/dL — ABNORMAL HIGH (ref 65–99)
Potassium: 4.8 mmol/L (ref 3.5–5.2)
Sodium: 143 mmol/L (ref 134–144)
Total Protein: 7 g/dL (ref 6.0–8.5)

## 2020-03-22 LAB — LIPID PANEL
Chol/HDL Ratio: 5 ratio (ref 0.0–5.0)
Cholesterol, Total: 165 mg/dL (ref 100–199)
HDL: 33 mg/dL — ABNORMAL LOW (ref >39)
LDL Chol Calc (NIH): 110 mg/dL — ABNORMAL HIGH (ref 0–99)
Triglycerides: 123 mg/dL (ref 0–149)
VLDL Cholesterol Cal: 22 mg/dL (ref 5–40)

## 2020-03-22 LAB — CARDIOVASCULAR RISK ASSESSMENT

## 2020-03-26 ENCOUNTER — Other Ambulatory Visit: Payer: Self-pay

## 2020-03-26 MED ORDER — EZETIMIBE 10 MG PO TABS: 10.0000 mg | ORAL_TABLET | Freq: Every day | ORAL | 1 refills | Status: DC

## 2020-03-26 NOTE — Telephone Encounter (Signed)
Patients wife called stating that Zetia is a tier 2 medication and if rx'd for 90 days, will only cost them 30 dollars a month. Rx sent to [facility] as requested.

## 2020-04-10 ENCOUNTER — Other Ambulatory Visit: Payer: Self-pay | Admitting: Family Medicine

## 2020-04-29 DIAGNOSIS — L570 Actinic keratosis: Secondary | ICD-10-CM | POA: Diagnosis not present

## 2020-04-29 DIAGNOSIS — L821 Other seborrheic keratosis: Secondary | ICD-10-CM | POA: Diagnosis not present

## 2020-05-26 ENCOUNTER — Other Ambulatory Visit: Payer: Self-pay | Admitting: Family Medicine

## 2020-06-17 ENCOUNTER — Other Ambulatory Visit: Payer: Self-pay

## 2020-06-17 ENCOUNTER — Ambulatory Visit (INDEPENDENT_AMBULATORY_CARE_PROVIDER_SITE_OTHER): Payer: PPO | Admitting: Family Medicine

## 2020-06-17 VITALS — BP 116/64 | HR 76 | Temp 96.7°F | Resp 18 | Ht 71.0 in | Wt 251.0 lb

## 2020-06-17 DIAGNOSIS — R7401 Elevation of levels of liver transaminase levels: Secondary | ICD-10-CM | POA: Diagnosis not present

## 2020-06-17 DIAGNOSIS — I119 Hypertensive heart disease without heart failure: Secondary | ICD-10-CM | POA: Diagnosis not present

## 2020-06-17 DIAGNOSIS — Z6835 Body mass index (BMI) 35.0-35.9, adult: Secondary | ICD-10-CM

## 2020-06-17 DIAGNOSIS — E782 Mixed hyperlipidemia: Secondary | ICD-10-CM

## 2020-06-17 DIAGNOSIS — E1142 Type 2 diabetes mellitus with diabetic polyneuropathy: Secondary | ICD-10-CM | POA: Diagnosis not present

## 2020-06-17 NOTE — Progress Notes (Signed)
Subjective:  Patient ID: Andre Shaffer, male    DOB: [birth year]  Age: 79 y.o. MRN: [id number]  Chief Complaint  Patient presents with  . Diabetes  . Hyperlipidemia    HPI: The patient presents with history of type 2 diabetes mellitus with neuropathy.. Patient is currently taking metfomrin 1000 mg one twice a day and humalog mix 75/25 mg 60 u in am and 40 u in pm. Compliance with treatment has been good; the patient takes medication as directed , maintains a diabetic diet and an exercise regimen. Sugars runs 90-200s.  Patient specifically denies associated symptoms, including blurred vision, fatigue, polydipsia, polyphagia and polyuria . Patient rarely has hypoglycemia. In regard to preventative care, the patient performs foot self-exams daily and ophthalmology exam is scheduled in 09/2020  Mixed hyperlipidemia  Pt presents with hyperlipidemia. Compliance with treatment has been good; The patient is compliant with medications (Zetia), maintains a low cholesterol diet , follows up as directed , and maintains an exercise regimen . The patient denies experiencing any hypercholesterolemia related symptoms. Unable to take statins due to elevated LFTs.  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  Currently on Toprol XL and micardis 80 mg once daily. Eats healthy.   Current Outpatient Medications on File Prior to Visit  Medication Sig Dispense Refill  . aspirin EC 325 MG tablet Take 325 mg by mouth daily.    . Cyanocobalamin 1000 MCG/ML LIQD Take 2,500 mcg by mouth.    . ezetimibe (ZETIA) 10 MG tablet Take 1 tablet (10 mg total) by mouth daily. 90 tablet 1  . FREESTYLE LITE test strip USE TO CHECK BLOOD SUGAR TWICE DAILY 200 each 2  . gabapentin (NEURONTIN) 300 MG capsule TAKE ONE CAPSULE BY MOUTH TWICE A DAY 180 capsule 3  . ibuprofen (ADVIL,MOTRIN) 200 MG tablet Take 200 mg by mouth every 8 (eight) hours as needed for moderate pain.     Marland Kitchen insulin lispro protamine-lispro (HUMALOG 75/25 MIX) (75-25) 100 UNIT/ML SUSP injection Inject 100 Units into the skin 2 (two) times daily with a meal. Inject 60 units every am and 40 units every pm 10 mL 0  . metFORMIN (GLUCOPHAGE) 1000 MG tablet Take 1 tablet (1,000 mg total) by mouth 2 (two) times daily. 180 tablet 1  . metoprolol succinate (TOPROL-XL) 25 MG 24 hr tablet TAKE ONE (1) TABLET ONCE DAILY 90 tablet 1  . telmisartan (MICARDIS) 80 MG tablet Take 80 mg by mouth daily.     No current facility-administered medications on file prior to visit.   Past Medical History:  Diagnosis Date  . Abnormal liver function   . Arthritis   . Atherosclerotic heart disease of native coronary artery without angina pectoris   . Diabetes mellitus without complication ([place])   . GERD (gastroesophageal reflux disease)   . Hyperlipemia   . Hypertension   . MI (myocardial infarction) [facility])    Past Surgical History:  Procedure Laterality Date  . ANKLE SURGERY  93,03   rt and lt   . CHOLECYSTECTOMY  10/19/2012   Procedure: LAPAROSCOPIC CHOLECYSTECTOMY WITH INTRAOPERATIVE CHOLANGIOGRAM;  Surgeon: Ralene Ok, MD;  Location: [place];  Service: General;  Laterality: N/A;  . COLONOSCOPY    . CORONARY ARTERY BYPASS GRAFT  1997  . TIBIA FRACTURE SURGERY      No family history on file. Social History   Socioeconomic History  . Marital status: Married    Spouse name:  Not on file  . Number of children: Not on file  . Years of education: Not on file  . Highest education level: Not on file  Occupational History  . Not on file  Tobacco Use  . Smoking status: Former Smoker    Quit date: 10/19/1975    Years since quitting: 44.7  . Smokeless tobacco: Never Used  Substance and Sexual Activity  . Alcohol use: No  . Drug use: No  . Sexual activity: Not on file  Other Topics Concern  . Not on file  Social History Narrative  . Not on file   Social Determinants of Health    Financial Resource Strain:   . Difficulty of Paying Living Expenses: Not on file  Food Insecurity:   . Worried About Charity fundraiser in the Last Year: Not on file  . Ran Out of Food in the Last Year: Not on file  Transportation Needs:   . Lack of Transportation (Medical): Not on file  . Lack of Transportation (Non-Medical): Not on file  Physical Activity:   . Days of Exercise per Week: Not on file  . Minutes of Exercise per Session: Not on file  Stress:   . Feeling of Stress : Not on file  Social Connections:   . Frequency of Communication with Friends and Family: Not on file  . Frequency of Social Gatherings with Friends and Family: Not on file  . Attends Religious Services: Not on file  . Active Member of Clubs or Organizations: Not on file  . Attends Archivist Meetings: Not on file  . Marital Status: Not on file    Review of Systems  Constitutional: Negative for chills, fatigue and fever.  HENT: Negative for congestion, ear pain and sinus pain.   Respiratory: Negative for cough and shortness of breath.   Cardiovascular: Negative for chest pain, palpitations and leg swelling.  Gastrointestinal: Positive for constipation and diarrhea. Negative for abdominal pain, nausea and vomiting.  Genitourinary: Negative for dysuria, frequency and hematuria.  Musculoskeletal: Positive for joint swelling (left foot pain ). Negative for arthralgias and back pain.  Neurological: Positive for weakness and light-headedness. Negative for dizziness and headaches.  Psychiatric/Behavioral: Negative for dysphoric mood. The patient is nervous/anxious.      Objective:  BP 116/64   Pulse 76   Temp (!) 96.7 F (35.9 C)   Resp 18   Ht 5\' 11"  (1.803 m)   Wt 251 lb (113.9 kg)   BMI 35.01 kg/m   BP/Weight 06/17/2020 12/19/2019 09/32/6712  Systolic BP 458 099 833  Diastolic BP 64 72 61  Wt. (Lbs) 251 250 -  BMI 35.01 35.36 -    Physical Exam Vitals reviewed.  Constitutional:       Appearance: Normal appearance.  Neck:     Vascular: No carotid bruit.  Cardiovascular:     Rate and Rhythm: Normal rate and regular rhythm.     Pulses: Normal pulses.     Heart sounds: Normal heart sounds.  Pulmonary:     Effort: Pulmonary effort is normal.     Breath sounds: Normal breath sounds. No wheezing, rhonchi or rales.  Abdominal:     General: Bowel sounds are normal.     Palpations: Abdomen is soft.     Tenderness: There is no abdominal tenderness.  Neurological:     Mental Status: He is alert and oriented to person, place, and time.  Psychiatric:        Mood and  Affect: Mood normal.        Behavior: Behavior normal.    Diabetic Foot Exam - Simple   Simple Foot Form Diabetic Foot exam was performed with the following findings: Yes 06/17/2020 11:58 AM  Visual Inspection See comments: Yes Sensation Testing See comments: Yes Pulse Check Posterior Tibialis and Dorsalis pulse intact bilaterally: Yes Comments Thickened nails. Callused feet on bottom BL, decreased sensation BL.      Lab Results  Component Value Date   WBC 8.0 06/17/2020   HGB 15.0 06/17/2020   HCT 44.1 06/17/2020   PLT 141 (L) 06/17/2020   GLUCOSE 198 (H) 06/20/2020   CHOL 149 06/17/2020   TRIG 133 06/17/2020   HDL 36 (L) 06/17/2020   LDLCALC 89 06/17/2020   ALT 84 (H) 06/20/2020   AST 49 (H) 06/20/2020   NA 140 06/20/2020   K 5.0 06/20/2020   CL 105 06/20/2020   CREATININE 1.03 06/20/2020   BUN 21 06/20/2020   CO2 27 06/20/2020   TSH 3.950 12/19/2019   HGBA1C 7.2 (H) 06/17/2020   MICROALBUR 150 12/19/2019      Assessment & Plan:   1. 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  No changes to medicines.  Continue to work on eating a healthy diet and exercise.  Labs drawn today.  - CBC with Differential/Platelet - Comprehensive metabolic panel  2. Mixed hyperlipidemia Well controlled.  No changes to medicines.  Continue to work on eating a healthy  diet and exercise.  Labs drawn today.  - Lipid panel  3. Diabetic polyneuropathy associated with type 2 diabetes mellitus ([place]) Control: fairly well. Recommend check sugars fasting daily. Recommend check feet daily. Recommend annual eye exams. Medicines: The current medical regimen is effective;  continue present plan and medications. Continue to work on eating a healthy diet and exercise.  Labs drawn today.   - POCT UA - Microalbumin - Hemoglobin A1c  4. Class 2 severe obesity due to excess calories with serious comorbidity and body mass index (BMI) of 35.0 to 35.9 in adult [facility]) Recommend continue to work on eating healthy diet and exercise.  Orders Placed This Encounter  Procedures  . CBC with Differential/Platelet  . Comprehensive metabolic panel  . Hemoglobin A1c  . Lipid panel  . Cardiovascular Risk Assessment  . POCT UA - Microalbumin     Follow-up: No follow-ups on file.  An After Visit Summary was printed and given to the patient.  Rochel Brome [facility] [phone]

## 2020-06-18 ENCOUNTER — Other Ambulatory Visit: Payer: Self-pay

## 2020-06-18 DIAGNOSIS — R7989 Other specified abnormal findings of blood chemistry: Secondary | ICD-10-CM

## 2020-06-18 LAB — CBC WITH DIFFERENTIAL/PLATELET
Basophils Absolute: 0.1 10*3/uL (ref 0.0–0.2)
Basos: 1 %
EOS (ABSOLUTE): 0.4 10*3/uL (ref 0.0–0.4)
Eos: 5 %
Hematocrit: 44.1 % (ref 37.5–51.0)
Hemoglobin: 15 g/dL (ref 13.0–17.7)
Immature Grans (Abs): 0 10*3/uL (ref 0.0–0.1)
Immature Granulocytes: 0 %
Lymphocytes Absolute: 1.8 10*3/uL (ref 0.7–3.1)
Lymphs: 22 %
MCH: 33 pg (ref 26.6–33.0)
MCHC: 34 g/dL (ref 31.5–35.7)
MCV: 97 fL (ref 79–97)
Monocytes Absolute: 0.6 10*3/uL (ref 0.1–0.9)
Monocytes: 7 %
Neutrophils Absolute: 5.2 10*3/uL (ref 1.4–7.0)
Neutrophils: 65 %
Platelets: 141 10*3/uL — ABNORMAL LOW (ref 150–450)
RBC: 4.55 x10E6/uL (ref 4.14–5.80)
RDW: 13.2 % (ref 11.6–15.4)
WBC: 8 10*3/uL (ref 3.4–10.8)

## 2020-06-18 LAB — COMPREHENSIVE METABOLIC PANEL
ALT: 199 IU/L — ABNORMAL HIGH (ref 0–44)
AST: 324 IU/L (ref 0–40)
Albumin/Globulin Ratio: 1.6 (ref 1.2–2.2)
Albumin: 4 g/dL (ref 3.7–4.7)
Alkaline Phosphatase: 211 IU/L — ABNORMAL HIGH (ref 48–121)
BUN/Creatinine Ratio: 16 (ref 10–24)
BUN: 20 mg/dL (ref 8–27)
Bilirubin Total: 1.2 mg/dL (ref 0.0–1.2)
CO2: 22 mmol/L (ref 20–29)
Calcium: 9.7 mg/dL (ref 8.6–10.2)
Chloride: 104 mmol/L (ref 96–106)
Creatinine, Ser: 1.28 mg/dL — ABNORMAL HIGH (ref 0.76–1.27)
GFR calc Af Amer: 61 mL/min/{1.73_m2} (ref >59)
GFR calc non Af Amer: 53 mL/min/{1.73_m2} — ABNORMAL LOW (ref >59)
Globulin, Total: 2.5 g/dL (ref 1.5–4.5)
Glucose: 202 mg/dL — ABNORMAL HIGH (ref 65–99)
Potassium: 5.3 mmol/L — ABNORMAL HIGH (ref 3.5–5.2)
Sodium: 139 mmol/L (ref 134–144)
Total Protein: 6.5 g/dL (ref 6.0–8.5)

## 2020-06-18 LAB — CARDIOVASCULAR RISK ASSESSMENT

## 2020-06-18 LAB — LIPID PANEL
Chol/HDL Ratio: 4.1 ratio (ref 0.0–5.0)
Cholesterol, Total: 149 mg/dL (ref 100–199)
HDL: 36 mg/dL — ABNORMAL LOW (ref >39)
LDL Chol Calc (NIH): 89 mg/dL (ref 0–99)
Triglycerides: 133 mg/dL (ref 0–149)
VLDL Cholesterol Cal: 24 mg/dL (ref 5–40)

## 2020-06-18 LAB — HEMOGLOBIN A1C
Est. average glucose Bld gHb Est-mCnc: 160 mg/dL
Hgb A1c MFr Bld: 7.2 % — ABNORMAL HIGH (ref 4.8–5.6)

## 2020-06-20 ENCOUNTER — Other Ambulatory Visit: Payer: Self-pay

## 2020-06-20 ENCOUNTER — Other Ambulatory Visit: Payer: BC Managed Care – PPO

## 2020-06-20 DIAGNOSIS — R7989 Other specified abnormal findings of blood chemistry: Secondary | ICD-10-CM

## 2020-06-20 LAB — COMPREHENSIVE METABOLIC PANEL
ALT: 84 IU/L — ABNORMAL HIGH (ref 0–44)
AST: 49 IU/L — ABNORMAL HIGH (ref 0–40)
Albumin/Globulin Ratio: 1.7 (ref 1.2–2.2)
Albumin: 4.1 g/dL (ref 3.7–4.7)
Alkaline Phosphatase: 185 IU/L — ABNORMAL HIGH (ref 48–121)
BUN/Creatinine Ratio: 20 (ref 10–24)
BUN: 21 mg/dL (ref 8–27)
Bilirubin Total: 0.5 mg/dL (ref 0.0–1.2)
CO2: 27 mmol/L (ref 20–29)
Calcium: 9.7 mg/dL (ref 8.6–10.2)
Chloride: 105 mmol/L (ref 96–106)
Creatinine, Ser: 1.03 mg/dL (ref 0.76–1.27)
GFR calc Af Amer: 79 mL/min/{1.73_m2} (ref >59)
GFR calc non Af Amer: 68 mL/min/{1.73_m2} (ref >59)
Globulin, Total: 2.4 g/dL (ref 1.5–4.5)
Glucose: 198 mg/dL — ABNORMAL HIGH (ref 65–99)
Potassium: 5 mmol/L (ref 3.5–5.2)
Sodium: 140 mmol/L (ref 134–144)
Total Protein: 6.5 g/dL (ref 6.0–8.5)

## 2020-06-22 ENCOUNTER — Encounter: Payer: Self-pay | Admitting: Family Medicine

## 2020-06-22 DIAGNOSIS — E1142 Type 2 diabetes mellitus with diabetic polyneuropathy: Secondary | ICD-10-CM | POA: Insufficient documentation

## 2020-07-10 ENCOUNTER — Telehealth: Payer: Self-pay

## 2020-07-10 NOTE — Telephone Encounter (Signed)
Left detailed message with results

## 2020-07-10 NOTE — Telephone Encounter (Signed)
-----   Message from Rochel Brome, MD sent at 06/22/2020  1:17 PM EDT ----- Regarding: liver enzymes Liver enzymes nearly back to normal. Kc

## 2020-08-25 ENCOUNTER — Other Ambulatory Visit: Payer: Self-pay | Admitting: Family Medicine

## 2020-08-25 ENCOUNTER — Other Ambulatory Visit: Payer: Self-pay | Admitting: Physician Assistant

## 2020-08-25 DIAGNOSIS — E1142 Type 2 diabetes mellitus with diabetic polyneuropathy: Secondary | ICD-10-CM

## 2020-09-16 ENCOUNTER — Telehealth: Payer: Self-pay

## 2020-09-16 NOTE — Telephone Encounter (Signed)
I spoke with the pt. Phone was breaking up. Pt stated he will call back. Pt is due for his AWV.

## 2020-09-29 DIAGNOSIS — C44629 Squamous cell carcinoma of skin of left upper limb, including shoulder: Secondary | ICD-10-CM | POA: Diagnosis not present

## 2020-09-30 ENCOUNTER — Other Ambulatory Visit: Payer: Self-pay | Admitting: Family Medicine

## 2020-10-06 ENCOUNTER — Encounter: Payer: Self-pay | Admitting: Family Medicine

## 2020-10-06 ENCOUNTER — Ambulatory Visit (INDEPENDENT_AMBULATORY_CARE_PROVIDER_SITE_OTHER): Payer: PPO | Admitting: Family Medicine

## 2020-10-06 ENCOUNTER — Other Ambulatory Visit: Payer: Self-pay

## 2020-10-06 VITALS — BP 134/68 | HR 77 | Temp 97.6°F | Ht 73.0 in | Wt 255.0 lb

## 2020-10-06 DIAGNOSIS — Z6833 Body mass index (BMI) 33.0-33.9, adult: Secondary | ICD-10-CM | POA: Diagnosis not present

## 2020-10-06 DIAGNOSIS — R7401 Elevation of levels of liver transaminase levels: Secondary | ICD-10-CM | POA: Diagnosis not present

## 2020-10-06 DIAGNOSIS — E119 Type 2 diabetes mellitus without complications: Secondary | ICD-10-CM | POA: Diagnosis not present

## 2020-10-06 DIAGNOSIS — E1142 Type 2 diabetes mellitus with diabetic polyneuropathy: Secondary | ICD-10-CM

## 2020-10-06 DIAGNOSIS — K5909 Other constipation: Secondary | ICD-10-CM

## 2020-10-06 DIAGNOSIS — E6609 Other obesity due to excess calories: Secondary | ICD-10-CM | POA: Diagnosis not present

## 2020-10-06 DIAGNOSIS — I10 Essential (primary) hypertension: Secondary | ICD-10-CM | POA: Diagnosis not present

## 2020-10-06 DIAGNOSIS — E1121 Type 2 diabetes mellitus with diabetic nephropathy: Secondary | ICD-10-CM | POA: Diagnosis not present

## 2020-10-06 DIAGNOSIS — Z23 Encounter for immunization: Secondary | ICD-10-CM

## 2020-10-06 DIAGNOSIS — R413 Other amnesia: Secondary | ICD-10-CM | POA: Diagnosis not present

## 2020-10-06 DIAGNOSIS — E782 Mixed hyperlipidemia: Secondary | ICD-10-CM

## 2020-10-06 DIAGNOSIS — I119 Hypertensive heart disease without heart failure: Secondary | ICD-10-CM

## 2020-10-06 DIAGNOSIS — E538 Deficiency of other specified B group vitamins: Secondary | ICD-10-CM | POA: Diagnosis not present

## 2020-10-06 LAB — POCT UA - MICROALBUMIN: Microalbumin Ur, POC: 80 mg/L

## 2020-10-06 NOTE — Progress Notes (Signed)
Subjective:  Patient ID: Andre Shaffer, male    DOB: January 30, 1941  Age: 79 y.o. MRN: [id number]  Chief Complaint  Patient presents with  . Diabetes  . Hyperlipidemia  . Hypertension    HPI  Diabetes complicated by neuropathy - metformin 1000 mg one twice a day and humalog 75/25 U 60 U in am, 40 before supper.  Ranges from 100-200. Very few low sugars. Checks feet daily. Eye exam November 2022. Eyes were good. Gabapentin for neuropathy  Hypertensive heart diease. - Metoprolol and Telmisartan  Hyperlipidemia- Zetia. Low fat diet.   Constipation - takes stool softeners/prunes. Occasionally has diarrhea.   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 Scheduled for [facility] surgery on 10/29/2020.  Current Outpatient Medications on File Prior to Visit  Medication Sig Dispense Refill  . aspirin EC 325 MG tablet Take 325 mg by mouth daily.    . Cyanocobalamin 1000 MCG/ML LIQD Take 2,500 mcg by mouth.    . ezetimibe (ZETIA) 10 MG tablet TAKE ONE (1) TABLET BY MOUTH ONCE DAILY 90 tablet 1  . FREESTYLE LITE test strip USE TO CHECK BLOOD SUGAR TWICE DAILY 200 each 2  . gabapentin (NEURONTIN) 300 MG capsule TAKE ONE CAPSULE BY MOUTH TWICE A DAY 180 capsule 3  . ibuprofen (ADVIL,MOTRIN) 200 MG tablet Take 200 mg by mouth every 8 (eight) hours as needed for moderate pain.    Marland Kitchen insulin lispro protamine-lispro (HUMALOG 75/25 MIX) (75-25) 100 UNIT/ML SUSP injection Inject 100 Units into the skin 2 (two) times daily with a meal. Inject 60 units every am and 40 units every pm 10 mL 0  . metFORMIN (GLUCOPHAGE) 1000 MG tablet Take 1 tablet (1,000 mg total) by mouth 2 (two) times daily. 180 tablet 1  . metoprolol succinate (TOPROL-XL) 25 MG 24 hr tablet TAKE ONE (1) TABLET ONCE DAILY 90 tablet 1  . telmisartan (MICARDIS) 80 MG tablet TAKE ONE (1) TABLET BY MOUTH ONCE DAILY 90 tablet 1   No current facility-administered medications on file prior to visit.   Past Medical History:  Diagnosis Date  . Abnormal liver function   .  Arthritis   . Atherosclerotic heart disease of native coronary artery without angina pectoris   . Diabetes mellitus without complication ([place])   . GERD (gastroesophageal reflux disease)   . Hyperlipemia   . Hypertension   . MI (myocardial infarction) [facility])    Past Surgical History:  Procedure Laterality Date  . ANKLE SURGERY  93,03   rt and lt   . CHOLECYSTECTOMY  10/19/2012   Procedure: LAPAROSCOPIC CHOLECYSTECTOMY WITH INTRAOPERATIVE CHOLANGIOGRAM;  Surgeon: Ralene Ok, MD;  Location: [place];  Service: General;  Laterality: N/A;  . COLONOSCOPY    . CORONARY ARTERY BYPASS GRAFT  1997  . TIBIA FRACTURE SURGERY      History reviewed. No pertinent family history. Social History   Socioeconomic History  . Marital status: Married    Spouse name: Not on file  . Number of children: Not on file  . Years of education: Not on file  . Highest education level: Not on file  Occupational History  . Not on file  Tobacco Use  . Smoking status: Former Smoker    Quit date: 10/19/1975    Years since quitting: 44.9  . Smokeless tobacco: Never Used  Substance and Sexual Activity  . Alcohol use: No  . Drug use: No  . Sexual activity: Not on file  Other Topics Concern  . Not on file  Social History Narrative  . Not on file   Social Determinants of Health   Financial Resource Strain:   . Difficulty of Paying Living Expenses: Not on file  Food Insecurity:   . Worried About Charity fundraiser in the Last Year: Not on file  . Ran Out of Food in the Last Year: Not on file  Transportation Needs:   . Lack of Transportation (Medical): Not on file  . Lack of Transportation (Non-Medical): Not on file  Physical Activity:   . Days of Exercise per Week: Not on file  . Minutes of Exercise per Session: Not on file  Stress:   . Feeling of Stress : Not on file  Social Connections:   . Frequency of Communication with Friends and Family: Not on file  . Frequency of Social  Gatherings with Friends and Family: Not on file  . Attends Religious Services: Not on file  . Active Member of Clubs or Organizations: Not on file  . Attends Archivist Meetings: Not on file  . Marital Status: Not on file    Review of Systems  Constitutional: Negative for chills, diaphoresis, fatigue and fever.  HENT: Positive for congestion and rhinorrhea (allergic rhinitis). Negative for ear pain and sore throat.   Respiratory: Positive for cough. Negative for shortness of breath.   Cardiovascular: Negative for chest pain and leg swelling.  Gastrointestinal: Positive for constipation and diarrhea. Negative for abdominal pain, nausea and vomiting.  Genitourinary: Negative for dysuria and urgency.  Musculoskeletal: Positive for arthralgias. Negative for myalgias.  Neurological: Positive for weakness. Negative for dizziness and headaches.  Psychiatric/Behavioral: Negative for dysphoric mood. The patient is not nervous/anxious.      Objective:  BP 134/68   Pulse 77   Temp 97.6 F (36.4 C)   Ht 6\' 1"  (1.854 m)   Wt 255 lb (115.7 kg)   SpO2 99%   BMI 33.64 kg/m   BP/Weight 10/06/2020 06/17/2020 1/61/0960  Systolic BP 454 098 119  Diastolic BP 68 64 72  Wt. (Lbs) 255 251 250  BMI 33.64 35.01 35.36    Physical Exam Vitals reviewed.  Constitutional:      Appearance: Normal appearance.  Neck:     Vascular: No carotid bruit.  Cardiovascular:     Rate and Rhythm: Normal rate and regular rhythm.     Pulses: Normal pulses.     Heart sounds: Normal heart sounds.  Pulmonary:     Effort: Pulmonary effort is normal.     Breath sounds: Normal breath sounds. No wheezing, rhonchi or rales.  Abdominal:     General: Bowel sounds are normal.     Palpations: Abdomen is soft.     Tenderness: There is no abdominal tenderness.  Neurological:     Mental Status: He is alert.  Psychiatric:        Mood and Affect: Mood normal.        Behavior: Behavior normal.     Diabetic  Foot Exam - Simple   Simple Foot Form Diabetic Foot exam was performed with the following findings: Yes 10/06/2020 10:40 AM  Visual Inspection See comments: Yes Sensation Testing See comments: Yes Pulse Check Posterior Tibialis and Dorsalis pulse intact bilaterally: Yes Comments Calluses BL.  Decreased sensation BL feet.        Lab Results  Component Value Date   WBC 8.0 06/17/2020   HGB 15.0 06/17/2020   HCT 44.1 06/17/2020   PLT 141 (L) 06/17/2020   GLUCOSE  198 (H) 06/20/2020   CHOL 149 06/17/2020   TRIG 133 06/17/2020   HDL 36 (L) 06/17/2020   LDLCALC 89 06/17/2020   ALT 84 (H) 06/20/2020   AST 49 (H) 06/20/2020   NA 140 06/20/2020   K 5.0 06/20/2020   CL 105 06/20/2020   CREATININE 1.03 06/20/2020   BUN 21 06/20/2020   CO2 27 06/20/2020   TSH 3.950 12/19/2019   HGBA1C 7.2 (H) 06/17/2020   MICROALBUR 80 10/06/2020      Assessment & Plan:   1. Mixed hyperlipidemia Well controlled.  No changes to medicines.  Continue to work on eating a healthy diet and exercise.  Labs drawn today.  - Lipid panel  2. Hypertensive heart disease without heart failure Well controlled.  No changes to medicines.  Continue to work on eating a healthy diet and exercise.  Labs drawn today.  - Comprehensive metabolic panel  3. Diabetic polyneuropathy associated with type 2 diabetes mellitus (HCC) Control: good Recommend check sugars fasting daily. Recommend check feet daily. Recommend annual eye exams. Medicines: no changes Continue to work on eating a healthy diet and exercise.  Labs drawn today.   - CBC with Differential/Platelet - Hemoglobin A1c - POCT UA - Microalbumin - 80   4. Other constipation Recommend metamucil  5. Diabetic glomerulopathy (HCC) On max dose of telmisartan 80 mg once daily.  - POCT UA - Microalbumin - 80   6. Elevated transaminase level - CMP  7. Need for immunization against influenza - Flu Vaccine QUAD High Dose(Fluad)  8. Class 1  obesity due to excess calories with serious comorbidity and body mass index (BMI) of 33.0 to 33.9 in adult  Recommend continue to work on eating healthy diet and exercise.   Orders Placed This Encounter  Procedures  . Flu Vaccine QUAD High Dose(Fluad)  . CBC with Differential/Platelet  . Comprehensive metabolic panel  . Hemoglobin A1c  . Lipid panel  . POCT UA - Microalbumin  . HM COLONOSCOPY    I spent 30 minutes dedicated to the care of this patient on the date of this encounter to include face-to-face time with the patient, as well as: review of labs.   Follow-up: Return in about 3 months (around 01/04/2021) for not fasting.  An After Visit Summary was printed and given to the patient.  Rochel Brome, MD [facility] [phone]

## 2020-10-06 NOTE — Patient Instructions (Signed)
Recommend add metamucil. Fluad shot given.   Constipation, Adult Constipation is when a person:  Poops (has a bowel movement) fewer times in a week than normal.  Has a hard time pooping.  Has poop that is dry, hard, or bigger than normal. Follow these instructions at home: Eating and drinking   Eat foods that have a lot of fiber, such as: ? Fresh fruits and vegetables. ? Whole grains. ? Beans.  Eat less of foods that are high in fat, low in fiber, or overly processed, such as: ? [place] fries. ? Hamburgers. ? Cookies. ? Candy. ? Soda.  Drink enough fluid to keep your pee (urine) clear or pale yellow. General instructions  Exercise regularly or as told by your doctor.  Go to the restroom when you feel like you need to poop. Do not hold it in.  Take over-the-counter and prescription medicines only as told by your doctor. These include any fiber supplements.  Do pelvic floor retraining exercises, such as: ? Doing deep breathing while relaxing your lower belly (abdomen). ? Relaxing your pelvic floor while pooping.  Watch your condition for any changes.  Keep all follow-up visits as told by your doctor. This is important. Contact a doctor if:  You have pain that gets worse.  You have a fever.  You have not pooped for 4 days.  You throw up (vomit).  You are not hungry.  You lose weight.  You are bleeding from the anus.  You have thin, pencil-like poop (stool). Get help right away if:  You have a fever, and your symptoms suddenly get worse.  You leak poop or have blood in your poop.  Your belly feels hard or bigger than normal (is bloated).  You have very bad belly pain.  You feel dizzy or you faint. This information is not intended to replace advice given to you by your health care provider. Make sure you discuss any questions you have with your health care provider. Document Revised: 09/30/2017 Document Reviewed: 04/07/2016 Elsevier Patient Education   2020 [employer].

## 2020-10-07 LAB — HEMOGLOBIN A1C
Est. average glucose Bld gHb Est-mCnc: 174 mg/dL
Hgb A1c MFr Bld: 7.7 % — ABNORMAL HIGH (ref 4.8–5.6)

## 2020-10-07 LAB — CBC WITH DIFFERENTIAL/PLATELET
Basophils Absolute: 0 10*3/uL (ref 0.0–0.2)
Basos: 1 %
EOS (ABSOLUTE): 0.4 10*3/uL (ref 0.0–0.4)
Eos: 6 %
Hematocrit: 43 % (ref 37.5–51.0)
Hemoglobin: 14.8 g/dL (ref 13.0–17.7)
Immature Grans (Abs): 0 10*3/uL (ref 0.0–0.1)
Immature Granulocytes: 0 %
Lymphocytes Absolute: 1.3 10*3/uL (ref 0.7–3.1)
Lymphs: 22 %
MCH: 32.8 pg (ref 26.6–33.0)
MCHC: 34.4 g/dL (ref 31.5–35.7)
MCV: 95 fL (ref 79–97)
Monocytes Absolute: 0.4 10*3/uL (ref 0.1–0.9)
Monocytes: 7 %
Neutrophils Absolute: 3.8 10*3/uL (ref 1.4–7.0)
Neutrophils: 64 %
Platelets: 128 10*3/uL — ABNORMAL LOW (ref 150–450)
RBC: 4.51 x10E6/uL (ref 4.14–5.80)
RDW: 12.8 % (ref 11.6–15.4)
WBC: 5.9 10*3/uL (ref 3.4–10.8)

## 2020-10-07 LAB — COMPREHENSIVE METABOLIC PANEL
ALT: 177 IU/L — ABNORMAL HIGH (ref 0–44)
AST: 151 IU/L — ABNORMAL HIGH (ref 0–40)
Albumin/Globulin Ratio: 1.4 (ref 1.2–2.2)
Albumin: 3.7 g/dL (ref 3.6–4.6)
Alkaline Phosphatase: 171 IU/L — ABNORMAL HIGH (ref 44–121)
BUN/Creatinine Ratio: 19 (ref 10–24)
BUN: 20 mg/dL (ref 8–27)
Bilirubin Total: 0.5 mg/dL (ref 0.0–1.2)
CO2: 24 mmol/L (ref 20–29)
Calcium: 9.3 mg/dL (ref 8.6–10.2)
Chloride: 105 mmol/L (ref 96–106)
Creatinine, Ser: 1.07 mg/dL (ref 0.76–1.27)
GFR calc Af Amer: 75 mL/min/{1.73_m2} (ref >59)
GFR calc non Af Amer: 65 mL/min/{1.73_m2} (ref >59)
Globulin, Total: 2.7 g/dL (ref 1.5–4.5)
Glucose: 175 mg/dL — ABNORMAL HIGH (ref 65–99)
Potassium: 5 mmol/L (ref 3.5–5.2)
Sodium: 140 mmol/L (ref 134–144)
Total Protein: 6.4 g/dL (ref 6.0–8.5)

## 2020-10-07 LAB — LIPID PANEL
Chol/HDL Ratio: 3.6 ratio (ref 0.0–5.0)
Cholesterol, Total: 138 mg/dL (ref 100–199)
HDL: 38 mg/dL — ABNORMAL LOW (ref >39)
LDL Chol Calc (NIH): 83 mg/dL (ref 0–99)
Triglycerides: 86 mg/dL (ref 0–149)
VLDL Cholesterol Cal: 17 mg/dL (ref 5–40)

## 2020-10-07 LAB — CARDIOVASCULAR RISK ASSESSMENT

## 2020-10-08 ENCOUNTER — Other Ambulatory Visit: Payer: Self-pay | Admitting: Family Medicine

## 2020-10-08 DIAGNOSIS — E538 Deficiency of other specified B group vitamins: Secondary | ICD-10-CM

## 2020-10-08 DIAGNOSIS — R413 Other amnesia: Secondary | ICD-10-CM

## 2020-10-09 ENCOUNTER — Other Ambulatory Visit: Payer: Self-pay | Admitting: Family Medicine

## 2020-10-09 DIAGNOSIS — R7401 Elevation of levels of liver transaminase levels: Secondary | ICD-10-CM

## 2020-10-13 LAB — TSH: TSH: 3.59 u[IU]/mL (ref 0.450–4.500)

## 2020-10-13 LAB — B12 AND FOLATE PANEL
Folate: 11.1 ng/mL (ref >3.0)
Vitamin B-12: 920 pg/mL (ref 232–1245)

## 2020-10-13 LAB — METHYLMALONIC ACID, SERUM

## 2020-10-13 LAB — SPECIMEN STATUS REPORT

## 2020-10-14 LAB — ANA: Anti Nuclear Antibody (ANA): NEGATIVE

## 2020-10-14 LAB — THYROID PEROXIDASE ANTIBODY: Thyroperoxidase Ab SerPl-aCnc: 13 IU/mL (ref 0–34)

## 2020-10-14 LAB — SPECIMEN STATUS REPORT

## 2020-10-14 LAB — ANTI-SMOOTH MUSCLE ANTIBODY, IGG: Smooth Muscle Ab: 8 Units (ref 0–19)

## 2020-10-21 ENCOUNTER — Other Ambulatory Visit: Payer: Self-pay | Admitting: Family Medicine

## 2020-11-04 DIAGNOSIS — L570 Actinic keratosis: Secondary | ICD-10-CM | POA: Diagnosis not present

## 2020-11-04 DIAGNOSIS — C44619 Basal cell carcinoma of skin of left upper limb, including shoulder: Secondary | ICD-10-CM | POA: Diagnosis not present

## 2020-11-12 DIAGNOSIS — C44629 Squamous cell carcinoma of skin of left upper limb, including shoulder: Secondary | ICD-10-CM | POA: Diagnosis not present

## 2020-12-17 ENCOUNTER — Ambulatory Visit: Payer: BC Managed Care – PPO | Admitting: Family Medicine

## 2020-12-30 DIAGNOSIS — C44629 Squamous cell carcinoma of skin of left upper limb, including shoulder: Secondary | ICD-10-CM

## 2020-12-30 HISTORY — DX: Squamous cell carcinoma of skin of left upper limb, including shoulder: C44.629

## 2020-12-30 HISTORY — PX: SKIN CANCER EXCISION: SHX779

## 2021-01-12 ENCOUNTER — Ambulatory Visit (INDEPENDENT_AMBULATORY_CARE_PROVIDER_SITE_OTHER): Payer: PPO | Admitting: Family Medicine

## 2021-01-12 ENCOUNTER — Other Ambulatory Visit: Payer: Self-pay

## 2021-01-12 VITALS — BP 128/72 | HR 70 | Temp 97.3°F | Ht 73.0 in | Wt 263.0 lb

## 2021-01-12 DIAGNOSIS — C44629 Squamous cell carcinoma of skin of left upper limb, including shoulder: Secondary | ICD-10-CM | POA: Diagnosis not present

## 2021-01-12 DIAGNOSIS — E1142 Type 2 diabetes mellitus with diabetic polyneuropathy: Secondary | ICD-10-CM | POA: Diagnosis not present

## 2021-01-12 DIAGNOSIS — Z6834 Body mass index (BMI) 34.0-34.9, adult: Secondary | ICD-10-CM

## 2021-01-12 DIAGNOSIS — E782 Mixed hyperlipidemia: Secondary | ICD-10-CM

## 2021-01-12 DIAGNOSIS — I119 Hypertensive heart disease without heart failure: Secondary | ICD-10-CM

## 2021-01-12 DIAGNOSIS — E1121 Type 2 diabetes mellitus with diabetic nephropathy: Secondary | ICD-10-CM | POA: Diagnosis not present

## 2021-01-12 NOTE — Progress Notes (Addendum)
Subjective:  Patient ID: Andre Shaffer, male    DOB: January 18, 1941  Age: 80 y.o. MRN: [id number]  Chief Complaint  Patient presents with  . Diabetes  . Hyperlipidemia    HPI Mixed hyperlipidemia Taking zetia daily, maintaining a healthy diet.  Hypertensive heart disease without heart failure Controlled with telmisartan and metoprolol.  Diabetic polyneuropathy and glomerulopathy (HCC) Checks FBS daily 100-150. Takes humalog and metformin, checks feet daily denies any sxs. Reports having low sugars every 3-4 days will eat something which improves his reading. Maintains a healthy diet.   CAD- follows with cardiology. On zetia, telmisartan, and metoprolol.  Current Outpatient Medications on File Prior to Visit  Medication Sig Dispense Refill  . aspirin EC 325 MG tablet Take 325 mg by mouth daily.    . Cyanocobalamin 1000 MCG/ML LIQD Take 2,500 mcg by mouth.    . ezetimibe (ZETIA) 10 MG tablet TAKE ONE (1) TABLET BY MOUTH ONCE DAILY 90 tablet 1  . FREESTYLE LITE test strip USE TO CHECK BLOOD SUGAR TWICE DAILY 200 each 2  . gabapentin (NEURONTIN) 300 MG capsule TAKE ONE CAPSULE BY MOUTH TWICE A DAY 180 capsule 3  . ibuprofen (ADVIL,MOTRIN) 200 MG tablet Take 200 mg by mouth every 8 (eight) hours as needed for moderate pain.    Marland Kitchen insulin lispro protamine-lispro (HUMALOG 75/25 MIX) (75-25) 100 UNIT/ML SUSP injection Inject 100 Units into the skin 2 (two) times daily with a meal. Inject 60 units every am and 40 units every pm 10 mL 0  . metFORMIN (GLUCOPHAGE) 1000 MG tablet TAKE ONE TABLET TWICE DAILY 180 tablet 1  . metoprolol succinate (TOPROL-XL) 25 MG 24 hr tablet TAKE ONE (1) TABLET ONCE DAILY 90 tablet 1  . telmisartan (MICARDIS) 80 MG tablet TAKE ONE (1) TABLET BY MOUTH ONCE DAILY 90 tablet 1   No current facility-administered medications on file prior to visit.   Past Medical History:  Diagnosis Date  . Abnormal liver function   . Arthritis   . Atherosclerotic heart disease of  native coronary artery without angina pectoris   . Diabetes mellitus without complication ([place])   . GERD (gastroesophageal reflux disease)   . Hyperlipemia   . Hypertension   . MI (myocardial infarction) ([place])   . SCC (squamous cell carcinoma), arm, left    Past Surgical History:  Procedure Laterality Date  . ANKLE SURGERY  93,03   rt and lt   . CHOLECYSTECTOMY  10/19/2012   Procedure: LAPAROSCOPIC CHOLECYSTECTOMY WITH INTRAOPERATIVE CHOLANGIOGRAM;  Surgeon: Ralene Ok, MD;  Location: [place];  Service: General;  Laterality: N/A;  . COLONOSCOPY    . CORONARY ARTERY BYPASS GRAFT  1997  . SKIN CANCER EXCISION  12/2020   Squamous Cell Cancer  . TIBIA FRACTURE SURGERY      History reviewed. No pertinent family history. Social History   Socioeconomic History  . Marital status: Married    Spouse name: Not on file  . Number of children: Not on file  . Years of education: Not on file  . Highest education level: Not on file  Occupational History  . Not on file  Tobacco Use  . Smoking status: Former Smoker    Quit date: 10/19/1975    Years since quitting: 45.2  . Smokeless tobacco: Never Used  Substance and Sexual Activity  . Alcohol use: No  . Drug use: No  . Sexual activity: Not on file  Other Topics Concern  . Not on file  Social History Narrative  . Not on file   Social Determinants of Health   Financial Resource Strain: Not on file  Food Insecurity: Not on file  Transportation Needs: Not on file  Physical Activity: Not on file  Stress: Not on file  Social Connections: Not on file    Review of Systems  Constitutional: Negative for chills, diaphoresis, fatigue and fever.  HENT: Negative for congestion, ear pain and sore throat.   Respiratory: Negative for cough and shortness of breath.   Cardiovascular: Negative for chest pain and leg swelling.  Gastrointestinal: Negative for abdominal pain, constipation, diarrhea, nausea and vomiting.   Genitourinary: Negative for dysuria and urgency.  Musculoskeletal: Negative for arthralgias and myalgias.  Neurological: Negative for dizziness and headaches.  Psychiatric/Behavioral: Negative for dysphoric mood.     Objective:  BP 128/72   Pulse 70   Temp (!) 97.3 F (36.3 C)   Ht 6\' 1"  (1.854 m)   Wt 263 lb (119.3 kg)   SpO2 99%   BMI 34.70 kg/m   BP/Weight 01/12/2021 10/06/2020 9/67/8938  Systolic BP 101 751 025  Diastolic BP 72 68 64  Wt. (Lbs) 263 255 251  BMI 34.7 33.64 35.01    Physical Exam Vitals reviewed.  Constitutional:      Appearance: Normal appearance. He is obese.  Cardiovascular:     Rate and Rhythm: Normal rate and regular rhythm.     Heart sounds: No murmur heard.   Pulmonary:     Effort: Pulmonary effort is normal.     Breath sounds: Normal breath sounds.  Abdominal:     General: Abdomen is flat. Bowel sounds are normal.     Palpations: Abdomen is soft.     Tenderness: There is no abdominal tenderness.  Neurological:     Mental Status: He is alert and oriented to person, place, and time.  Psychiatric:        Mood and Affect: Mood normal.        Behavior: Behavior normal.     Diabetic Foot Exam - Simple   Simple Foot Form Visual Inspection No deformities, no ulcerations, no other skin breakdown bilaterally: Yes Sensation Testing See comments: Yes Pulse Check Posterior Tibialis and Dorsalis pulse intact bilaterally: Yes Comments Decreased sensation Bilaterally.       Lab Results  Component Value Date   WBC 5.5 01/12/2021   HGB 13.9 01/12/2021   HCT 40.7 01/12/2021   PLT 124 (L) 01/12/2021   GLUCOSE 111 (H) 01/12/2021   CHOL 145 01/12/2021   TRIG 49 01/12/2021   HDL 32 (L) 01/12/2021   LDLCALC 102 (H) 01/12/2021   ALT 16 01/12/2021   AST 14 01/12/2021   NA 141 01/12/2021   K 4.5 01/12/2021   CL 106 01/12/2021   CREATININE 1.01 01/12/2021   BUN 16 01/12/2021   CO2 21 01/12/2021   TSH 3.590 10/06/2020   HGBA1C 7.0 (H)  01/12/2021   MICROALBUR 80 10/06/2020      Assessment & Plan:   1. Mixed hyperlipidemia Continue zetia.  Intolerant to statin. Increases his liver enzymes. - Lipid panel  2. Hypertensive heart disease without heart failure Well controlled.  No changes to medicines.  Continue to work on eating a healthy diet and exercise.  Labs drawn today.  - Comprehensive metabolic panel  3. Diabetic polyneuropathy associated with type 2 diabetes mellitus ([place]) Control: improved. At goal Recommend check sugars fasting daily. Recommend check feet daily. Recommend annual eye exams. Medicines: no changes.  Continue to work on eating a healthy diet and exercise.  Labs drawn today.    4. Diabetic glomerulopathy ([place]) Control: improved. At goal Recommend check sugars fasting daily. Recommend check feet daily. Recommend annual eye exams. Medicines: no changes Continue to work on eating a healthy diet and exercise.  Labs drawn today.   - CBC with Differential/Platelet - Hemoglobin A1c  5. Body mass index (BMI) 34.0-34.9, adult  Recommend walking 10 minutes daily then gradually increase.  6. Left SCC forearm - excised.   Orders Placed This Encounter  Procedures  . CBC with Differential/Platelet  . Comprehensive metabolic panel  . Hemoglobin A1c  . Lipid panel  . Cardiovascular Risk Assessment     Follow-up: Return in about 3 months (around 04/14/2021) for fasting.Marland Kitchen  An After Visit Summary was printed and given to the patient.  Rochel Brome, MD [facility] [phone]

## 2021-01-13 ENCOUNTER — Encounter: Payer: Self-pay | Admitting: Family Medicine

## 2021-01-13 LAB — CBC WITH DIFFERENTIAL/PLATELET
Basophils Absolute: 0 10*3/uL (ref 0.0–0.2)
Basos: 1 %
EOS (ABSOLUTE): 0.3 10*3/uL (ref 0.0–0.4)
Eos: 6 %
Hematocrit: 40.7 % (ref 37.5–51.0)
Hemoglobin: 13.9 g/dL (ref 13.0–17.7)
Immature Grans (Abs): 0 10*3/uL (ref 0.0–0.1)
Immature Granulocytes: 0 %
Lymphocytes Absolute: 1.3 10*3/uL (ref 0.7–3.1)
Lymphs: 24 %
MCH: 32.4 pg (ref 26.6–33.0)
MCHC: 34.2 g/dL (ref 31.5–35.7)
MCV: 95 fL (ref 79–97)
Monocytes Absolute: 0.4 10*3/uL (ref 0.1–0.9)
Monocytes: 7 %
Neutrophils Absolute: 3.4 10*3/uL (ref 1.4–7.0)
Neutrophils: 62 %
Platelets: 124 10*3/uL — ABNORMAL LOW (ref 150–450)
RBC: 4.29 x10E6/uL (ref 4.14–5.80)
RDW: 13.3 % (ref 11.6–15.4)
WBC: 5.5 10*3/uL (ref 3.4–10.8)

## 2021-01-13 LAB — COMPREHENSIVE METABOLIC PANEL
ALT: 16 IU/L (ref 0–44)
AST: 14 IU/L (ref 0–40)
Albumin/Globulin Ratio: 1.4 (ref 1.2–2.2)
Albumin: 3.5 g/dL — ABNORMAL LOW (ref 3.6–4.6)
Alkaline Phosphatase: 85 IU/L (ref 44–121)
BUN/Creatinine Ratio: 16 (ref 10–24)
BUN: 16 mg/dL (ref 8–27)
Bilirubin Total: 0.4 mg/dL (ref 0.0–1.2)
CO2: 21 mmol/L (ref 20–29)
Calcium: 8.6 mg/dL (ref 8.6–10.2)
Chloride: 106 mmol/L (ref 96–106)
Creatinine, Ser: 1.01 mg/dL (ref 0.76–1.27)
Globulin, Total: 2.5 g/dL (ref 1.5–4.5)
Glucose: 111 mg/dL — ABNORMAL HIGH (ref 65–99)
Potassium: 4.5 mmol/L (ref 3.5–5.2)
Sodium: 141 mmol/L (ref 134–144)
Total Protein: 6 g/dL (ref 6.0–8.5)
eGFR: 75 mL/min/{1.73_m2} (ref >59)

## 2021-01-13 LAB — HEMOGLOBIN A1C
Est. average glucose Bld gHb Est-mCnc: 154 mg/dL
Hgb A1c MFr Bld: 7 % — ABNORMAL HIGH (ref 4.8–5.6)

## 2021-01-13 LAB — LIPID PANEL
Chol/HDL Ratio: 4.5 ratio (ref 0.0–5.0)
Cholesterol, Total: 145 mg/dL (ref 100–199)
HDL: 32 mg/dL — ABNORMAL LOW (ref >39)
LDL Chol Calc (NIH): 102 mg/dL — ABNORMAL HIGH (ref 0–99)
Triglycerides: 49 mg/dL (ref 0–149)
VLDL Cholesterol Cal: 11 mg/dL (ref 5–40)

## 2021-01-13 LAB — CARDIOVASCULAR RISK ASSESSMENT

## 2021-01-16 DIAGNOSIS — Z1211 Encounter for screening for malignant neoplasm of colon: Secondary | ICD-10-CM | POA: Diagnosis not present

## 2021-01-16 DIAGNOSIS — Z800 Family history of malignant neoplasm of digestive organs: Secondary | ICD-10-CM | POA: Diagnosis not present

## 2021-01-16 DIAGNOSIS — D125 Benign neoplasm of sigmoid colon: Secondary | ICD-10-CM | POA: Diagnosis not present

## 2021-01-16 DIAGNOSIS — D123 Benign neoplasm of transverse colon: Secondary | ICD-10-CM | POA: Diagnosis not present

## 2021-01-16 DIAGNOSIS — Z86010 Personal history of colonic polyps: Secondary | ICD-10-CM | POA: Diagnosis not present

## 2021-01-16 LAB — HM COLONOSCOPY

## 2021-01-17 ENCOUNTER — Encounter: Payer: Self-pay | Admitting: Family Medicine

## 2021-01-17 DIAGNOSIS — I10 Essential (primary) hypertension: Secondary | ICD-10-CM | POA: Insufficient documentation

## 2021-01-17 DIAGNOSIS — L603 Nail dystrophy: Secondary | ICD-10-CM | POA: Insufficient documentation

## 2021-03-02 ENCOUNTER — Other Ambulatory Visit: Payer: Self-pay | Admitting: Family Medicine

## 2021-03-02 DIAGNOSIS — E1142 Type 2 diabetes mellitus with diabetic polyneuropathy: Secondary | ICD-10-CM

## 2021-03-10 DIAGNOSIS — L570 Actinic keratosis: Secondary | ICD-10-CM | POA: Diagnosis not present

## 2021-04-16 ENCOUNTER — Ambulatory Visit: Payer: PPO | Admitting: Family Medicine

## 2021-04-16 ENCOUNTER — Encounter: Payer: Self-pay | Admitting: Nurse Practitioner

## 2021-04-16 ENCOUNTER — Telehealth: Payer: Self-pay | Admitting: Family Medicine

## 2021-04-16 ENCOUNTER — Ambulatory Visit (INDEPENDENT_AMBULATORY_CARE_PROVIDER_SITE_OTHER): Payer: PPO | Admitting: Nurse Practitioner

## 2021-04-16 VITALS — BP 138/70 | HR 72 | Temp 97.2°F | Resp 16 | Ht 74.0 in | Wt 252.0 lb

## 2021-04-16 DIAGNOSIS — R808 Other proteinuria: Secondary | ICD-10-CM | POA: Diagnosis not present

## 2021-04-16 DIAGNOSIS — R509 Fever, unspecified: Secondary | ICD-10-CM | POA: Diagnosis not present

## 2021-04-16 DIAGNOSIS — R1084 Generalized abdominal pain: Secondary | ICD-10-CM | POA: Diagnosis not present

## 2021-04-16 DIAGNOSIS — R112 Nausea with vomiting, unspecified: Secondary | ICD-10-CM | POA: Diagnosis not present

## 2021-04-16 DIAGNOSIS — E782 Mixed hyperlipidemia: Secondary | ICD-10-CM

## 2021-04-16 DIAGNOSIS — K5730 Diverticulosis of large intestine without perforation or abscess without bleeding: Secondary | ICD-10-CM | POA: Diagnosis not present

## 2021-04-16 DIAGNOSIS — E1165 Type 2 diabetes mellitus with hyperglycemia: Secondary | ICD-10-CM

## 2021-04-16 DIAGNOSIS — R748 Abnormal levels of other serum enzymes: Secondary | ICD-10-CM

## 2021-04-16 DIAGNOSIS — R822 Biliuria: Secondary | ICD-10-CM

## 2021-04-16 DIAGNOSIS — Z794 Long term (current) use of insulin: Secondary | ICD-10-CM | POA: Diagnosis not present

## 2021-04-16 LAB — POCT URINALYSIS DIPSTICK
Blood, UA: NEGATIVE
Glucose, UA: POSITIVE — AB
Ketones, UA: POSITIVE
Leukocytes, UA: NEGATIVE
Nitrite, UA: NEGATIVE
Protein, UA: POSITIVE — AB
Spec Grav, UA: 1.02 (ref 1.010–1.025)
Urobilinogen, UA: 1 E.U./dL
pH, UA: 6 (ref 5.0–8.0)

## 2021-04-16 LAB — POCT UA - MICROALBUMIN: Microalbumin Ur, POC: 150 mg/L

## 2021-04-16 NOTE — Telephone Encounter (Signed)
   Andre Shaffer has been scheduled for the following appointment:  WHAT: CT Scan Abdomen/Pelvis WHERE: [facility] DATE: 04/23/21 TIME: 10:30am arrival time   A message has been left for the patient. Pick up prep kit by 4:30 on 6/22, no solid foods after 8:30am on 6/23

## 2021-04-16 NOTE — Patient Instructions (Addendum)
Rest and push fluids We will call you with lab results and appointment for abdominal/pelvis CT Continue medications Follow-up with Dr Tobie Poet 05/12/21 at 0830  Abdominal Pain, Adult Many things can cause belly (abdominal) pain. Most times, belly pain is not dangerous. Many cases of belly pain can be watched and treated at home. Sometimes, though, belly pain is serious. Yourdoctor will try to find the cause of your belly pain. Follow these instructions at home:  Medicines Take over-the-counter and prescription medicines only as told by your doctor. Do not take medicines that help you poop (laxatives) unless told by your doctor. General instructions Watch your belly pain for any changes. Drink enough fluid to keep your pee (urine) pale yellow. Keep all follow-up visits as told by your doctor. This is important. Contact a doctor if: Your belly pain changes or gets worse. You are not hungry, or you lose weight without trying. You are having trouble pooping (constipated) or have watery poop (diarrhea) for more than 2-3 days. You have pain when you pee or poop. Your belly pain wakes you up at night. Your pain gets worse with meals, after eating, or with certain foods. You are vomiting and cannot keep anything down. You have a fever. You have blood in your pee. Get help right away if: Your pain does not go away as soon as your doctor says it should. You cannot stop vomiting. Your pain is only in areas of your belly, such as the right side or the left lower part of the belly. You have bloody or black poop, or poop that looks like tar. You have very bad pain, cramping, or bloating in your belly. You have signs of not having enough fluid or water in your body (dehydration), such as: Dark pee, very little pee, or no pee. Cracked lips. Dry mouth. Sunken eyes. Sleepiness. Weakness. You have trouble breathing or chest pain. Summary Many cases of belly pain can be watched and treated at  home. Watch your belly pain for any changes. Take over-the-counter and prescription medicines only as told by your doctor. Contact a doctor if your belly pain changes or gets worse. Get help right away if you have very bad pain, cramping, or bloating in your belly. This information is not intended to replace advice given to you by your health care provider. Make sure you discuss any questions you have with your healthcare provider. Document Revised: 02/26/2019 Document Reviewed: 02/26/2019 Elsevier Patient Education  [place]. Vomiting, Adult Vomiting occurs when stomach contents are thrown up and out of the mouth. Many people notice nausea before vomiting. Vomiting can make you feel weak and cause you to become dehydrated. Dehydration can make you feel tired and thirsty, cause you to have a dry mouth, and decrease how often you urinate. Older adults and people who have other diseases or a weak body defense system (immune system) are at higher risk for dehydration. It is important to treat vomiting as [place] your health care provider. Follow these instructions at home:  Eating and drinking     Follow these recommendations as told by your health care provider: Take an oral rehydration solution (ORS). This is a drink that is sold at pharmacies and retail stores. Eat bland, easy-to-digest foods in small amounts as you are able. These foods include bananas, applesauce, rice, lean meats, toast, and crackers. Drink clear fluids slowly and in small amounts as you are able. Clear fluids include water, ice chips, low-calorie sports drinks, and fruit juice  that has water added (diluted fruit juice). Avoid drinking fluids that contain a lot of sugar or caffeine, such as energy drinks, sports drinks, and soda. Avoid alcohol. Avoid spicy or fatty foods.  General instructions Wash your hands often using soap and water. If soap and water are not available, use hand sanitizer. Make sure that  everyone in your household washes their hands frequently. Take over-the-counter and prescription medicines only as told by your health care provider. Rest at home while you recover. Watch your condition for any changes. Keep all follow-up visits as told by your health care provider. This is important. Contact a health care provider if: Your vomiting gets worse. You have new symptoms. You have a fever. You cannot drink fluids without vomiting. You feel light-headed or dizzy. You have a headache. You have muscle cramps. You have a rash. You have pain while urinating. Get help right away if: You have pain in your chest, neck, arm, or jaw. You feel extremely weak or you faint. You have persistent vomiting. You have vomit that is bright red or looks like black coffee grounds. You have stools that are bloody or black, or stools that look like tar. You have a severe headache, a stiff neck, or both. You have severe pain, cramping, or bloating in your abdomen. You have trouble breathing or you are breathing very quickly. Your heart is beating very quickly. Your skin feels cold and clammy. You feel confused. You have signs of dehydration, such as: Dark urine, very little urine, or no urine. Cracked lips. Dry mouth. Sunken eyes. Sleepiness. Weakness. These symptoms may represent a serious problem that is an emergency. Do not wait to see if the symptoms will go away. Get medical help right away. Call your local emergency services (911 in the [place] Do not drive yourself to the hospital. Summary Vomiting occurs when stomach contents are thrown up and out of the mouth. Vomiting can cause you to become dehydrated. Older adults and people who have other diseases or a weak immune system are at higher risk for dehydration. It is important to treat vomiting as told by your health care provider. Follow your health care provider's instructions about eating and drinking. Wash your hands often using  soap and water. If soap and water are not available, use hand sanitizer. Make sure that everyone in your household washes their hands frequently. Watch your condition for any changes and for signs of dehydration. Keep all follow-up visits as told by your health care provider. This is important. This information is not intended to replace advice given to you by your health care provider. Make sure you discuss any questions you have with your healthcare provider. Document Revised: 04/06/2019 Document Reviewed: 03/28/2018 Elsevier Patient Education  [place].

## 2021-04-16 NOTE — Progress Notes (Signed)
Acute Office Visit  Subjective:  Patient ID: Andre Shaffer, male    DOB: May 13, 1941  Age: 80 y.o. MRN: [id number]  CC: Vomiting   HPI Andre Shaffer presents for vomiting, fever, generalized abdominal pain, diarrhea, and generalized weakness. Pt states his has elevated blood sugar averaging 200. Onset of symptoms was approximately 1-week-ago. He tells me that his urine has become dark, cola colored. He states he has had intermittent elevated liver enzymes. He underwent screening colonoscopy with Dr Melina Copa on 01/16/21 that revealed diverticulosis of sigmoid colon and removed 3 polyps approximately 3-4 mm. Bud denies previous history of diverticulitis. He has undergone previous cholecystectomy.  Past Medical History:  Diagnosis Date   Abnormal liver function    Arthritis    Atherosclerotic heart disease of native coronary artery without angina pectoris    Diabetes mellitus without complication (HCC)    GERD (gastroesophageal reflux disease)    Hyperlipemia    Hypertension    MI (myocardial infarction) ([place])    SCC (squamous cell carcinoma), arm, left    SCC (squamous cell carcinoma), arm, 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    Past Surgical History:  Procedure Laterality Date   ANKLE SURGERY  93,03   rt and lt    CHOLECYSTECTOMY  10/19/2012   Procedure: LAPAROSCOPIC CHOLECYSTECTOMY WITH INTRAOPERATIVE CHOLANGIOGRAM;  Surgeon: Ralene Ok, MD;  Location: [facility];  Service: General;  Laterality: N/A;   COLONOSCOPY     CORONARY ARTERY BYPASS GRAFT  1997   SKIN CANCER EXCISION  12/2020   Squamous Cell Cancer   TIBIA FRACTURE SURGERY      History reviewed. No pertinent family history.  Social History   Socioeconomic History   Marital status: Married    Spouse name: Not on file   Number of children: Not on file   Years of education: Not on file   Highest education level: Not on file  Occupational History   Not on file  Tobacco Use   Smoking status: Former    Pack years:  0.00    Types: Cigarettes    Quit date: 10/19/1975    Years since quitting: 45.5   Smokeless tobacco: Never  Substance and Sexual Activity   Alcohol use: No   Drug use: No   Sexual activity: Not on file  Other Topics Concern   Not on file  Social History Narrative   Not on file   Social Determinants of Health   Financial Resource Strain: Not on file  Food Insecurity: Not on file  Transportation Needs: Not on file  Physical Activity: Not on file  Stress: Not on file  Social Connections: Not on file  Intimate Partner Violence: Not on file    ROS Review of Systems  Constitutional:  Positive for appetite change (decreased), diaphoresis (night sweats), fatigue and fever.  HENT:  Positive for congestion and hearing loss (bilateral, L>R).   Eyes:  Positive for visual disturbance (prescription glasses).  Respiratory:  Negative for choking, chest tightness and shortness of breath.   Cardiovascular:  Negative for chest pain.  Gastrointestinal:  Positive for abdominal pain, diarrhea, nausea and vomiting. Negative for blood in stool and constipation.  Endocrine: Positive for cold intolerance. Negative for heat intolerance, polydipsia, polyphagia and polyuria.  Musculoskeletal:  Positive for arthralgias (right shoulder).  Skin: Negative.   Allergic/Immunologic: Positive for environmental allergies.  Neurological:  Positive for weakness.       "Off-balance"  Psychiatric/Behavioral:  Positive for sleep disturbance (insomnia).    Objective:  Today's Vitals: Physical Exam Vitals reviewed.  Constitutional:      Appearance: Normal appearance.  HENT:     Right Ear: Tympanic membrane normal.     Left Ear: Tympanic membrane normal.     Nose: Congestion present.     Mouth/Throat:     Mouth: Mucous membranes are dry.  Eyes:     Pupils: Pupils are equal, round, and reactive to light.  Cardiovascular:     Rate and Rhythm: Normal rate and regular rhythm.     Pulses: Normal pulses.      Heart sounds: Normal heart sounds.  Pulmonary:     Effort: Pulmonary effort is normal.     Breath sounds: Normal breath sounds.  Abdominal:     General: Bowel sounds are normal.     Palpations: Abdomen is soft.     Tenderness: There is abdominal tenderness. There is guarding.  Musculoskeletal:        General: Normal range of motion.     Cervical back: Neck supple.  Skin:    General: Skin is warm and dry.     Capillary Refill: Capillary refill takes less than 2 seconds.  Neurological:     General: No focal deficit present.     Mental Status: He is alert and oriented to person, place, and time.  Psychiatric:        Mood and Affect: Mood normal.        Behavior: Behavior normal.   BP 138/70   Pulse 72   Temp (!) 97.2 F (36.2 C)   Resp 16   Ht 6\' 2"  (1.88 m)   Wt 252 lb (114.3 kg)   BMI 32.35 kg/m    Assessment & Plan:   1. Generalized abdominal pain - CT ABDOMEN PELVIS W WO CONTRAST  2. Fever, unspecified fever cause - CT ABDOMEN PELVIS W WO CONTRAST  3. Non-intractable vomiting with nausea, unspecified vomiting type - CBC with Differential/Platelet - Comprehensive metabolic panel - Hemoglobin A1c - Lipid panel - CT ABDOMEN PELVIS W WO CONTRAST  4. Bilirubin in urine - POCT urinalysis dipstick - CT ABDOMEN PELVIS W WO CONTRAST  5. Other proteinuria - POCT UA - Microalbumin  6. Elevated liver enzymes - CT ABDOMEN PELVIS W WO CONTRAST  7. Diverticulosis of sigmoid colon  8. Mixed hyperlipidemia - Lipid panel  9. Type 2 diabetes mellitus with hyperglycemia, with long-term current use of insulin (HCC) - CBC with Differential/Platelet - Comprehensive metabolic panel - Hemoglobin A1c      Outpatient Encounter Medications as of 04/16/2021  Medication Sig   aspirin EC 325 MG tablet Take 325 mg by mouth daily.   Cyanocobalamin 1000 MCG/ML LIQD Take 2,500 mcg by mouth.   ezetimibe (ZETIA) 10 MG tablet TAKE ONE (1) TABLET BY MOUTH ONCE DAILY   FREESTYLE  LITE test strip USE TO CHECK BLOOD SUGAR TWICE DAILY   gabapentin (NEURONTIN) 300 MG capsule TAKE ONE CAPSULE BY MOUTH TWICE A DAY   ibuprofen (ADVIL,MOTRIN) 200 MG tablet Take 200 mg by mouth every 8 (eight) hours as needed for moderate pain.   insulin lispro protamine-lispro (HUMALOG 75/25 MIX) (75-25) 100 UNIT/ML SUSP injection Inject 100 Units into the skin 2 (two) times daily with a meal. Inject 60 units every am and 40 units every pm   metFORMIN (GLUCOPHAGE) 1000 MG tablet TAKE ONE TABLET TWICE DAILY   metoprolol succinate (TOPROL-XL) 25 MG 24 hr tablet TAKE ONE (1) TABLET ONCE DAILY   telmisartan (MICARDIS) 80  MG tablet TAKE ONE (1) TABLET BY MOUTH ONCE DAILY   No facility-administered encounter medications on file as of 04/16/2021.   Rest and push fluids We will call you with lab results and appointment for abdominal/pelvis CT Continue medications Follow-up with Dr Tobie Poet 05/12/21 at 0830   Follow-up: Pending labs and CT results   Signed, Rip Harbour, NP

## 2021-04-16 NOTE — Progress Notes (Deleted)
Acute Office Visit  Subjective:    Patient ID: Andre Shaffer, male    DOB: 1941/09/12, 80 y.o.   MRN: [id number]  CC Vomiting   HPI Patient is in today for vomiting, intermittent fever, generalized weakness, and night sweats.  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  Social History   Socioeconomic History   Marital status: Married    Spouse name: Not on file   Number of children: Not on file   Years of education: Not on file   Highest education level: Not on file  Occupational History   Not on file  Tobacco Use   Smoking status: Former    Pack years: 0.00    Types: Cigarettes    Quit date: 10/19/1975    Years since quitting: 45.5   Smokeless tobacco: Never  Substance and Sexual Activity   Alcohol use: No   Drug use: No   Sexual activity: Not on file  Other Topics Concern   Not on file  Social History Narrative   Not on file   Social Determinants of Health   Financial Resource Strain: Not on file  Food Insecurity: Not on file  Transportation Needs: Not on file  Physical Activity: Not on file  Stress: Not on file   Social Connections: Not on file  Intimate Partner Violence: Not on file    Outpatient Medications Prior to Visit  Medication Sig Dispense Refill   aspirin EC 325 MG tablet Take 325 mg by mouth daily.     Cyanocobalamin 1000 MCG/ML LIQD Take 2,500 mcg by mouth.     ezetimibe (ZETIA) 10 MG tablet TAKE ONE (1) TABLET BY MOUTH ONCE DAILY 90 tablet 1   FREESTYLE LITE test strip USE TO CHECK BLOOD SUGAR TWICE DAILY 200 each 2   gabapentin (NEURONTIN) 300 MG capsule TAKE ONE CAPSULE BY MOUTH TWICE A DAY 180 capsule 3   ibuprofen (ADVIL,MOTRIN) 200 MG tablet Take 200 mg by mouth every 8 (eight) hours as needed for moderate pain.     insulin lispro protamine-lispro (HUMALOG 75/25 MIX) (75-25) 100 UNIT/ML SUSP injection Inject 100 Units into the skin 2 (two) times daily with a meal. Inject 60 units every am and 40 units every pm 10 mL 0   metFORMIN (GLUCOPHAGE) 1000 MG tablet TAKE ONE TABLET TWICE DAILY 180 tablet 1   metoprolol succinate (TOPROL-XL) 25 MG 24 hr tablet TAKE ONE (1) TABLET ONCE DAILY 90 tablet 1   telmisartan (MICARDIS) 80 MG tablet TAKE ONE (1) TABLET BY MOUTH ONCE DAILY 90  tablet 1   No facility-administered medications prior to visit.    Allergies  Allergen Reactions   Nitroglycerin Other (See Comments)    Blood pressure 'bottomed out'   Statins     Elevation of liver enzymes   Tetanus Toxoid Other (See Comments)    Severe flu symptoms fever and chills   Penicillins Rash    Review of Systems  Constitutional:  Positive for appetite change (decreased), diaphoresis (night sweats), fatigue and fever.  HENT:  Positive for congestion and hearing loss (bilateral, left > right). Negative for ear pain.   Eyes:  Positive for visual disturbance (wears prescription glasses).  Respiratory:  Positive for cough. Negative for chest tightness and shortness of breath.   Cardiovascular:  Negative for chest pain and palpitations.  Gastrointestinal:  Positive for abdominal pain  (intermittent), diarrhea, nausea and vomiting.  Endocrine: Positive for heat intolerance. Negative for cold intolerance, polydipsia, polyphagia and polyuria.  Genitourinary:  Negative for dysuria.  Musculoskeletal:  Positive for arthralgias (right shoulder).  Allergic/Immunologic: Positive for environmental allergies.  Neurological:  Positive for weakness. Negative for dizziness, light-headedness and headaches.  Psychiatric/Behavioral:  Positive for sleep disturbance (insomnia).       Objective:    Physical Exam Vitals reviewed.  Constitutional:      Appearance: Normal appearance.  HENT:     Mouth/Throat:     Mouth: Mucous membranes are dry.  Cardiovascular:     Rate and Rhythm: Normal rate and regular rhythm.     Pulses: Normal pulses.     Heart sounds: Normal heart sounds.  Pulmonary:     Effort: Pulmonary effort is normal.     Breath sounds: Normal breath sounds.  Abdominal:     General: Bowel sounds are normal.     Palpations: Abdomen is soft.  Musculoskeletal:     Cervical back: Neck supple.  Skin:    General: Skin is warm and dry.     Capillary Refill: Capillary refill takes less than 2 seconds.  Neurological:     General: No focal deficit present.     Mental Status: He is alert and oriented to person, place, and time.  Psychiatric:        Mood and Affect: Mood normal.        Behavior: Behavior normal.    Wt Readings from Last 3 Encounters:  01/12/21 263 lb (119.3 kg)  10/06/20 255 lb (115.7 kg)  06/17/20 251 lb (113.9 kg)    Health Maintenance Due  Topic Date Due   OPHTHALMOLOGY EXAM  Never done   TETANUS/TDAP  Never done   Zoster Vaccines- Shingrix (1 of 2) Never done   PNA vac Low Risk Adult (2 of 2 - PCV13) 08/02/2015   COVID-19 Vaccine (4 - Booster for Pfizer series) 01/14/2021    There are no preventive care reminders to display for this patient.   Lab Results  Component Value Date   TSH 3.590 10/06/2020   Lab Results  Component Value Date    WBC 5.5 01/12/2021   HGB 13.9 01/12/2021   HCT 40.7 01/12/2021   MCV 95 01/12/2021   PLT 124 (L) 01/12/2021   Lab Results  Component Value Date   NA 141 01/12/2021   K 4.5 01/12/2021   CO2 21 01/12/2021   GLUCOSE 111 (H) 01/12/2021   BUN 16 01/12/2021   CREATININE 1.01 01/12/2021   BILITOT 0.4 01/12/2021   ALKPHOS 85 01/12/2021   AST 14 01/12/2021   ALT 16 01/12/2021  PROT 6.0 01/12/2021   ALBUMIN 3.5 (L) 01/12/2021   CALCIUM 8.6 01/12/2021   EGFR 75 01/12/2021   Lab Results  Component Value Date   CHOL 145 01/12/2021   Lab Results  Component Value Date   HDL 32 (L) 01/12/2021   Lab Results  Component Value Date   LDLCALC 102 (H) 01/12/2021   Lab Results  Component Value Date   TRIG 49 01/12/2021   Lab Results  Component Value Date   CHOLHDL 4.5 01/12/2021   Lab Results  Component Value Date   HGBA1C 7.0 (H) 01/12/2021       Assessment & Plan:   Problem List Items Addressed This Visit   None    No orders of the defined types were placed in this encounter.    Rip Harbour, NP

## 2021-04-17 ENCOUNTER — Other Ambulatory Visit: Payer: Self-pay | Admitting: Nurse Practitioner

## 2021-04-17 ENCOUNTER — Other Ambulatory Visit: Payer: Self-pay | Admitting: Family Medicine

## 2021-04-17 DIAGNOSIS — R1084 Generalized abdominal pain: Secondary | ICD-10-CM

## 2021-04-17 DIAGNOSIS — R748 Abnormal levels of other serum enzymes: Secondary | ICD-10-CM

## 2021-04-17 DIAGNOSIS — R110 Nausea: Secondary | ICD-10-CM

## 2021-04-17 LAB — COMPREHENSIVE METABOLIC PANEL
ALT: 114 IU/L — ABNORMAL HIGH (ref 0–44)
AST: 62 IU/L — ABNORMAL HIGH (ref 0–40)
Albumin/Globulin Ratio: 1.3 (ref 1.2–2.2)
Albumin: 3.4 g/dL — ABNORMAL LOW (ref 3.6–4.6)
Alkaline Phosphatase: 295 IU/L — ABNORMAL HIGH (ref 44–121)
BUN/Creatinine Ratio: 13 (ref 10–24)
BUN: 14 mg/dL (ref 8–27)
Bilirubin Total: 3.6 mg/dL — ABNORMAL HIGH (ref 0.0–1.2)
CO2: 21 mmol/L (ref 20–29)
Calcium: 9.2 mg/dL (ref 8.6–10.2)
Chloride: 102 mmol/L (ref 96–106)
Creatinine, Ser: 1.09 mg/dL (ref 0.76–1.27)
Globulin, Total: 2.6 g/dL (ref 1.5–4.5)
Glucose: 212 mg/dL — ABNORMAL HIGH (ref 65–99)
Potassium: 4.6 mmol/L (ref 3.5–5.2)
Sodium: 138 mmol/L (ref 134–144)
Total Protein: 6 g/dL (ref 6.0–8.5)
eGFR: 68 mL/min/{1.73_m2} (ref >59)

## 2021-04-17 LAB — CBC WITH DIFFERENTIAL/PLATELET
Basophils Absolute: 0 10*3/uL (ref 0.0–0.2)
Basos: 0 %
EOS (ABSOLUTE): 0.2 10*3/uL (ref 0.0–0.4)
Eos: 3 %
Hematocrit: 40.2 % (ref 37.5–51.0)
Hemoglobin: 13.7 g/dL (ref 13.0–17.7)
Immature Grans (Abs): 0 10*3/uL (ref 0.0–0.1)
Immature Granulocytes: 0 %
Lymphocytes Absolute: 0.8 10*3/uL (ref 0.7–3.1)
Lymphs: 12 %
MCH: 32.2 pg (ref 26.6–33.0)
MCHC: 34.1 g/dL (ref 31.5–35.7)
MCV: 94 fL (ref 79–97)
Monocytes Absolute: 0.4 10*3/uL (ref 0.1–0.9)
Monocytes: 6 %
Neutrophils Absolute: 5.5 10*3/uL (ref 1.4–7.0)
Neutrophils: 79 %
Platelets: 119 10*3/uL — ABNORMAL LOW (ref 150–450)
RBC: 4.26 x10E6/uL (ref 4.14–5.80)
RDW: 13.1 % (ref 11.6–15.4)
WBC: 7 10*3/uL (ref 3.4–10.8)

## 2021-04-17 LAB — LIPID PANEL
Chol/HDL Ratio: 5.4 ratio — ABNORMAL HIGH (ref 0.0–5.0)
Cholesterol, Total: 140 mg/dL (ref 100–199)
HDL: 26 mg/dL — ABNORMAL LOW (ref >39)
LDL Chol Calc (NIH): 89 mg/dL (ref 0–99)
Triglycerides: 139 mg/dL (ref 0–149)
VLDL Cholesterol Cal: 25 mg/dL (ref 5–40)

## 2021-04-17 LAB — CARDIOVASCULAR RISK ASSESSMENT

## 2021-04-17 LAB — HEMOGLOBIN A1C
Est. average glucose Bld gHb Est-mCnc: 180 mg/dL
Hgb A1c MFr Bld: 7.9 % — ABNORMAL HIGH (ref 4.8–5.6)

## 2021-04-20 ENCOUNTER — Telehealth: Payer: Self-pay

## 2021-04-20 ENCOUNTER — Other Ambulatory Visit: Payer: Self-pay | Admitting: Nurse Practitioner

## 2021-04-20 DIAGNOSIS — R1084 Generalized abdominal pain: Secondary | ICD-10-CM

## 2021-04-20 DIAGNOSIS — R822 Biliuria: Secondary | ICD-10-CM

## 2021-04-20 DIAGNOSIS — R748 Abnormal levels of other serum enzymes: Secondary | ICD-10-CM

## 2021-04-20 DIAGNOSIS — R17 Unspecified jaundice: Secondary | ICD-10-CM

## 2021-04-20 DIAGNOSIS — R509 Fever, unspecified: Secondary | ICD-10-CM

## 2021-04-20 DIAGNOSIS — R110 Nausea: Secondary | ICD-10-CM

## 2021-04-20 NOTE — Telephone Encounter (Signed)
Discussed referral with patient's wife.  She prefers referral to [facility].

## 2021-04-23 DIAGNOSIS — R1110 Vomiting, unspecified: Secondary | ICD-10-CM | POA: Diagnosis not present

## 2021-04-23 DIAGNOSIS — R822 Biliuria: Secondary | ICD-10-CM | POA: Diagnosis not present

## 2021-04-23 DIAGNOSIS — R748 Abnormal levels of other serum enzymes: Secondary | ICD-10-CM | POA: Diagnosis not present

## 2021-04-23 DIAGNOSIS — N200 Calculus of kidney: Secondary | ICD-10-CM | POA: Diagnosis not present

## 2021-04-23 DIAGNOSIS — R112 Nausea with vomiting, unspecified: Secondary | ICD-10-CM | POA: Diagnosis not present

## 2021-04-23 DIAGNOSIS — K5750 Diverticulosis of both small and large intestine without perforation or abscess without bleeding: Secondary | ICD-10-CM | POA: Diagnosis not present

## 2021-04-23 DIAGNOSIS — R509 Fever, unspecified: Secondary | ICD-10-CM | POA: Diagnosis not present

## 2021-04-28 ENCOUNTER — Other Ambulatory Visit: Payer: Self-pay | Admitting: Family Medicine

## 2021-04-28 ENCOUNTER — Other Ambulatory Visit: Payer: Self-pay | Admitting: Nurse Practitioner

## 2021-04-28 DIAGNOSIS — R17 Unspecified jaundice: Secondary | ICD-10-CM

## 2021-04-28 DIAGNOSIS — R748 Abnormal levels of other serum enzymes: Secondary | ICD-10-CM

## 2021-04-28 DIAGNOSIS — R1084 Generalized abdominal pain: Secondary | ICD-10-CM

## 2021-04-28 DIAGNOSIS — R509 Fever, unspecified: Secondary | ICD-10-CM

## 2021-04-30 ENCOUNTER — Ambulatory Visit
Admission: RE | Admit: 2021-04-30 | Discharge: 2021-04-30 | Disposition: A | Discharge status: Home / Self Care | Payer: PPO | Source: Ambulatory Visit | Attending: Family Medicine | Admitting: Family Medicine

## 2021-04-30 DIAGNOSIS — R17 Unspecified jaundice: Secondary | ICD-10-CM

## 2021-04-30 DIAGNOSIS — I700 Atherosclerosis of aorta: Secondary | ICD-10-CM | POA: Diagnosis not present

## 2021-04-30 DIAGNOSIS — R1084 Generalized abdominal pain: Secondary | ICD-10-CM

## 2021-04-30 DIAGNOSIS — R935 Abnormal findings on diagnostic imaging of other abdominal regions, including retroperitoneum: Secondary | ICD-10-CM | POA: Diagnosis not present

## 2021-04-30 DIAGNOSIS — Z9049 Acquired absence of other specified parts of digestive tract: Secondary | ICD-10-CM | POA: Diagnosis not present

## 2021-04-30 MED ORDER — GADOBENATE DIMEGLUMINE 529 MG/ML IV SOLN
20.0000 mL | Freq: Once | INTRAVENOUS | Status: AC | PRN
  Administered 2021-04-30: 20 mL via INTRAVENOUS

## 2021-05-01 ENCOUNTER — Encounter (HOSPITAL_COMMUNITY): Payer: Self-pay | Admitting: Emergency Medicine

## 2021-05-01 ENCOUNTER — Other Ambulatory Visit: Payer: Self-pay

## 2021-05-01 ENCOUNTER — Emergency Department (HOSPITAL_COMMUNITY)
Admission: EM | Admit: 2021-05-01 | Discharge: 2021-05-01 | Disposition: A | Discharge status: Home / Self Care | Payer: PPO | Attending: Emergency Medicine | Admitting: Emergency Medicine

## 2021-05-01 DIAGNOSIS — I10 Essential (primary) hypertension: Secondary | ICD-10-CM | POA: Diagnosis not present

## 2021-05-01 DIAGNOSIS — Z951 Presence of aortocoronary bypass graft: Secondary | ICD-10-CM | POA: Insufficient documentation

## 2021-05-01 DIAGNOSIS — N179 Acute kidney failure, unspecified: Secondary | ICD-10-CM

## 2021-05-01 DIAGNOSIS — Z87891 Personal history of nicotine dependence: Secondary | ICD-10-CM | POA: Insufficient documentation

## 2021-05-01 DIAGNOSIS — Z79899 Other long term (current) drug therapy: Secondary | ICD-10-CM | POA: Insufficient documentation

## 2021-05-01 DIAGNOSIS — E119 Type 2 diabetes mellitus without complications: Secondary | ICD-10-CM | POA: Diagnosis not present

## 2021-05-01 DIAGNOSIS — Z794 Long term (current) use of insulin: Secondary | ICD-10-CM | POA: Diagnosis not present

## 2021-05-01 DIAGNOSIS — Z7984 Long term (current) use of oral hypoglycemic drugs: Secondary | ICD-10-CM | POA: Insufficient documentation

## 2021-05-01 DIAGNOSIS — K8050 Calculus of bile duct without cholangitis or cholecystitis without obstruction: Secondary | ICD-10-CM | POA: Diagnosis not present

## 2021-05-01 DIAGNOSIS — K8080 Other cholelithiasis without obstruction: Secondary | ICD-10-CM | POA: Diagnosis not present

## 2021-05-01 DIAGNOSIS — R1033 Periumbilical pain: Secondary | ICD-10-CM | POA: Diagnosis present

## 2021-05-01 DIAGNOSIS — Z7982 Long term (current) use of aspirin: Secondary | ICD-10-CM | POA: Insufficient documentation

## 2021-05-01 LAB — COMPREHENSIVE METABOLIC PANEL
ALT: 74 U/L — ABNORMAL HIGH (ref 0–44)
AST: 48 U/L — ABNORMAL HIGH (ref 15–41)
Albumin: 3.4 g/dL — ABNORMAL LOW (ref 3.5–5.0)
Alkaline Phosphatase: 252 U/L — ABNORMAL HIGH (ref 38–126)
Anion gap: 10 (ref 5–15)
BUN: 22 mg/dL (ref 8–23)
CO2: 22 mmol/L (ref 22–32)
Calcium: 9.7 mg/dL (ref 8.9–10.3)
Chloride: 102 mmol/L (ref 98–111)
Creatinine, Ser: 1.31 mg/dL — ABNORMAL HIGH (ref 0.61–1.24)
GFR, Estimated: 55 mL/min — ABNORMAL LOW (ref >60)
Glucose, Bld: 420 mg/dL — ABNORMAL HIGH (ref 70–99)
Potassium: 4.8 mmol/L (ref 3.5–5.1)
Sodium: 134 mmol/L — ABNORMAL LOW (ref 135–145)
Total Bilirubin: 1.3 mg/dL — ABNORMAL HIGH (ref 0.3–1.2)
Total Protein: 7.3 g/dL (ref 6.5–8.1)

## 2021-05-01 LAB — CBC WITH DIFFERENTIAL/PLATELET
Abs Immature Granulocytes: 0.02 10*3/uL (ref 0.00–0.07)
Basophils Absolute: 0.1 10*3/uL (ref 0.0–0.1)
Basophils Relative: 1 %
Eosinophils Absolute: 0.2 10*3/uL (ref 0.0–0.5)
Eosinophils Relative: 4 %
HCT: 44.8 % (ref 39.0–52.0)
Hemoglobin: 14.9 g/dL (ref 13.0–17.0)
Immature Granulocytes: 0 %
Lymphocytes Relative: 17 %
Lymphs Abs: 0.9 10*3/uL (ref 0.7–4.0)
MCH: 32.4 pg (ref 26.0–34.0)
MCHC: 33.3 g/dL (ref 30.0–36.0)
MCV: 97.4 fL (ref 80.0–100.0)
Monocytes Absolute: 0.4 10*3/uL (ref 0.1–1.0)
Monocytes Relative: 7 %
Neutro Abs: 4 10*3/uL (ref 1.7–7.7)
Neutrophils Relative %: 71 %
Platelets: 187 10*3/uL (ref 150–400)
RBC: 4.6 MIL/uL (ref 4.22–5.81)
RDW: 13.5 % (ref 11.5–15.5)
WBC: 5.6 10*3/uL (ref 4.0–10.5)
nRBC: 0 % (ref 0.0–0.2)

## 2021-05-01 LAB — LIPASE, BLOOD: Lipase: 25 U/L (ref 11–51)

## 2021-05-01 LAB — CBG MONITORING, ED: Glucose-Capillary: 316 mg/dL — ABNORMAL HIGH (ref 70–99)

## 2021-05-01 MED ORDER — CIPROFLOXACIN HCL 500 MG PO TABS: 500.0000 mg | ORAL_TABLET | Freq: Two times a day (BID) | ORAL | 0 refills | Status: DC

## 2021-05-01 MED ORDER — SODIUM CHLORIDE 0.9 % IV SOLN: Freq: Once | INTRAVENOUS | Status: DC

## 2021-05-01 NOTE — ED Provider Notes (Addendum)
Emergency Medicine Provider Triage Evaluation Note  Andre Shaffer , a 80 y.o. male  was evaluated in triage.  Pt complains of abdominal pain.  Patient has had abdominal pain constantly over the last month.  At present patient has epigastric abdominal pain.  Patient rates his pain 1/10 on the pain scale.  Patient denies any nausea, vomiting, diarrhea, urinary symptoms, blood in stool, melena, fevers, or chills.  Reports that he had an MRI completed yesterday by his primary care provider due to his pain.  Patient contacted by his primary care provider today gallstones and needed to come to the emergency department.  Has had gallbladder removed previously.  Reports that his gastroenterologist is Dr. Melina Copa in [place]  Positive: Abdominal pain Negative: nausea, vomiting, diarrhea, urinary symptoms, blood in stool, melena, fevers, or chills.  Physical Exam  BP 126/75 (BP Location: Left Arm)   Pulse 78   Temp (!) 97.5 F (36.4 C) (Oral)   Resp 16   Ht 6\' 1"  (1.854 m)   Wt 113.4 kg   SpO2 100%   BMI 32.98 kg/m  Gen:   Awake, no distress   Resp:  Normal effort  MSK:   Moves extremities without difficulty  Other:  Abdomen soft, nondistended, nontender.  No guarding or rebound tenderness.  Medical Decision Making  Medically screening exam initiated at 10:46 AM.  Appropriate orders placed.  [name] was informed that the remainder of the evaluation will be completed by another provider, this initial triage assessment does not replace that evaluation, and the importance of remaining in the ED until their evaluation is complete.  The patient appears stable so that the remainder of the work up may be completed by another provider.      Loni Beckwith, PA-C 05/01/21 1048    Loni Beckwith, PA-C 05/01/21 1049    Lorelle Gibbs, DO 05/04/21 1745

## 2021-05-01 NOTE — Discharge Instructions (Addendum)
You came to the emergency department today due to your abdominal pain and the findings of your MRI scan yesterday.  You were evaluated by the with our gastroenterology team in the emergency department.  This team recommended that you be started on the antibiotic Cipro for 5 days and follow-up with Dr. Lyndel Safe in the outpatient setting.  If you do not hear from their office in 2 business days please use the information I have provided you to call and schedule a follow-up appointment.  You may have diarrhea from the antibiotics.  It is very important that you continue to take the antibiotics even if you get diarrhea unless a medical professional tells you that you may stop taking them.  If you stop too early the bacteria you are being treated for will become stronger and you may need different, more powerful antibiotics that have more side effects and worsening diarrhea.  Please stay well hydrated and consider probiotics as they may decrease the severity of your diarrhea.    Get help right away if: Your pain moves to another part of your abdomen or to your back. You continue to have symptoms or you develop new symptoms even with treatment. Your skin or the whites of your eyes look yellow (jaundice).

## 2021-05-01 NOTE — ED Triage Notes (Addendum)
Pt reports abd pain for past month. Pt reports doctor told him to come in d/t gall stones. MRI completed yesterday. Pt denies n/v/d. Pt reports 1/10 abd pain at this time.

## 2021-05-01 NOTE — Consult Note (Addendum)
[facility] Consult: 3:23 PM 05/01/2021  LOS: 0 days    Referring Provider: PA-C   Primary Care Physician:  Rochel Brome, MD Primary Gastroenterologist:  Dr. Melina Copa.       Reason for Consultation: Choledocholithiasis.   HPI: Andre Shaffer is a 80 y.o. male.  Medical diagnosis includes IDDM.  TID CABG. Underwent lap chole in December thousand 13.  An MRCP at that time showed the cholelithiasis but IOC was negative.  Tiny nonobstructing stones in distal CBD and subtle pancreatic stranding, possibly focal mild pancreatitis.  Patient relays history of elevated LFTs dating back a couple of years.  01/16/2021 colonoscopy.  By Dr. Nehemiah Settle for hx of colon polyps.  Study showed 3 sigmoid polyps, 3 to 4 mm sized, removed.  Sigmoid diverticulosis.  Another 8 mm polyp was removed from the transverse colon.  Pathology on the polyps was tubular adenomas without HDD and sessile serrated polyps without dysplasia  Medical documentation begins 04/16/2021 when he was seen by NP at PCP office for vomiting, fever, abdominal pain, diarrhea, weakness of about a weeks duration.  Reported dark, cola colored urine.  T bili was 3.6.  Alk phos 295.  AST/ALT 62/114.  Subsequent imaging studies:  04/23/21 CTAP w/o contrast: Nonobstructing left nephrolithiasis.  Sigmoid diverticulosis, no diverticulitis.  No CT findings in abdomen or pelvics to explain abdominal pain, nausea, vomiting 04/29/2021 MRI abdomen/MRCP shows small gallstones in mid CBD none greater than 3 to 4 mm no biliary ductal dilatation.  Gallbladder surgically absent.  Pancreas, pancreatic duct, liver parenchyma unremarkable.  Based on the MRI findings and phone contact from PCP to Dr. Lyndel Safe (has never seen pt) he was sent to [facility], ER for evaluation. Patient reports  he has had 2 or 3 episodes of abdominal pain across the upper abdomen not radiating to the chest or back.  Nausea without vomiting may or may not accompany this.  Generally anorexic since the onset of the symptoms over a month ago.  Intermittently his urine looks very orange.  Almost a week ago, on Sunday he had fever and chills which resolved quickly.  Full dose aspirin every day but no blood thinner or platelet disrupting medications 30 to 40 pound weight loss over a little over a year, unintentional.  No drastic weight loss in the last several weeks.  LFTs today with T bili 1.3.  Alk phos 252.  AST/ALT 48/74.  Lipase 25. WBCs 5.6.  Hb 14.9.  MCV 97.  Platelets normal but previously low at 119 on 6/16. Glucose is 420.  Sodium 134.  GFR 55, BUN/creatinine 22/1.3.  Family history of cholecystectomy in a brother and his mother.  Colon cancer in an older brother. Married for 67 years.  Does not drink alcohol.    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 Authorizing Provider  aspirin EC 325 MG tablet Take 325  mg by mouth daily.   Yes [provider]  Cyanocobalamin 1000 MCG/ML LIQD Take 2,500 mcg by mouth See admin instructions. Place 0.5 dropperful (2,500 mcg) sublingually daily   Yes [provider]  ezetimibe (ZETIA) 10 MG tablet TAKE ONE (1) TABLET BY MOUTH ONCE DAILY Patient taking differently: Take 10 mg by mouth every evening. 04/17/21  Yes Lillard Anes, MD  gabapentin (NEURONTIN) 300 MG capsule TAKE ONE CAPSULE BY MOUTH TWICE A DAY Patient taking differently: Take 300 mg by mouth at bedtime as needed (for neuropathy). 05/26/20  Yes Cox, Kirsten, MD  ibuprofen (ADVIL,MOTRIN) 200 MG tablet Take 200 mg by mouth every 8 (eight) hours as needed for moderate pain or mild pain.   Yes [provider]  insulin lispro protamine-lispro (HUMALOG 75/25 MIX) (75-25) 100 UNIT/ML SUSP injection Inject 100 Units into the skin 2 (two) times daily with a meal. Inject 60 units every am and 40 units every pm Patient taking differently: Inject 40-60 Units into the skin See admin instructions. Inject 60 units into the skin before breakfast and 40 units units before supper/evening meal 12/25/19  Yes Cox, Kirsten, MD  metFORMIN (GLUCOPHAGE) 1000 MG tablet TAKE ONE TABLET TWICE DAILY Patient taking differently: Take 1,000 mg by mouth 2 (two) times daily with a meal. 10/21/20  Yes Cox, Kirsten, MD  metoprolol succinate (TOPROL-XL) 25 MG 24 hr tablet TAKE ONE (1) TABLET ONCE DAILY Patient taking differently: Take 25 mg by mouth in the morning. 03/02/21  Yes Cox, Kirsten, MD  telmisartan (MICARDIS) 80 MG tablet TAKE ONE (1) TABLET BY MOUTH ONCE DAILY Patient taking differently: Take 80 mg by mouth in the morning. 03/02/21  Yes Cox, Kirsten, MD  FREESTYLE LITE test strip USE TO CHECK BLOOD SUGAR TWICE DAILY 04/10/20   Rochel Brome, MD    Scheduled Meds:  Infusions:  PRN Meds:    Allergies as of 05/01/2021 - Review Complete 05/01/2021  Allergen Reaction Noted   Nitroglycerin Other (See  Comments)    Statins Other (See Comments) 06/22/2020   Tetanus toxoid Other (See Comments)    Penicillins Rash     No family history on file.  Social History   Socioeconomic History   Marital status: Married    Spouse name: Not on file   Number of children: Not on file   Years of education: Not on file   Highest education level: Not on file  Occupational History   Not on file  Tobacco Use   Smoking status: Former    Pack years: 0.00    Types: Cigarettes    Quit date: 10/19/1975    Years since quitting: 45.5   Smokeless tobacco: Never  Substance and Sexual Activity   Alcohol use: No   Drug use: No   Sexual activity: Not on file  Other Topics Concern   Not on file  Social History Narrative   Not on file   Social Determinants of Health   Financial Resource Strain: Not on file  Food Insecurity: Not on file  Transportation Needs: Not on file  Physical Activity: Not on file  Stress: Not on file  Social Connections: Not on file  Intimate Partner Violence: Not on file    REVIEW OF SYSTEMS: Constitutional: Intermittent fatigue but no profound weakness. ENT:  No nose bleeds Pulm: No shortness of breath or cough. CV:  No palpitations, no LE edema.  Angina. GU:  No hematuria, no frequency GI: See HPI. Heme: No unusual bleeding or bruising. Transfusions: No transfusion with PRBCs. Neuro:  No headaches, no peripheral tingling or numbness.  No syncope, no seizures Derm: History of squamous cell cancers removal especially from his face, head. Endocrine:  No polyuria or dysuria Immunization: Not queried.     PHYSICAL EXAM: Vital signs in last 24 hours: Vitals:   05/01/21 1500 05/01/21 1515  BP: 120/64 139/73  Pulse: 61 61  Resp: 15 18  Temp:    SpO2: 97% 98%   Wt Readings from Last 3 Encounters:  05/01/21 113.4 kg  04/16/21 114.3 kg  01/12/21 119.3 kg    General: Patient is comfortable, does not look chronically or acutely ill.  Laying comfortably on the  stretcher.  Wife is at his side. Head: No facial asymmetry or swelling.  No signs of head trauma.  Sun related damage and lesions on his scalp and face. Eyes: No scleral icterus or conjunctival pallor. Ears: Hard of hearing Nose: No congestion or discharge Mouth: Moist, pink, clear oral mucosa.  Tongue midline. Neck: No JVD, no masses, no thyromegaly Lungs: Clear bilaterally without labored breathing or cough. Heart: RRR.  No MRG.  S1, S2 present. Abdomen: Mildly obese, soft, nontender, nondistended.  Active bowel sounds.  No HSM, masses, bruits, hernias..   Rectal: Deferred Musc/Skeltl: No joint redness, swelling or gross deformity. Extremities: No CCE. Neurologic: Oriented x3.  Good historian.  Moves all 4 limbs without gross weakness or tremor but formal strength testing not pursued. Skin: Sun related damage and lesions on his head and scalp as well as the arms. Nodes: No cervical adenopathy Psych: Calm, pleasant, cooperative.  Intake/Output from previous day: No intake/output data recorded. Intake/Output this shift: No intake/output data recorded.  LAB RESULTS: Recent Labs    05/01/21 1025  WBC 5.6  HGB 14.9  HCT 44.8  PLT 187   BMET Lab Results  Component Value Date   NA 134 (L) 05/01/2021   NA 138 04/16/2021   NA 141 01/12/2021   K 4.8 05/01/2021   K 4.6 04/16/2021   K 4.5 01/12/2021   CL 102 05/01/2021   CL 102 04/16/2021   CL 106 01/12/2021   CO2 22 05/01/2021   CO2 21 04/16/2021   CO2 21 01/12/2021   GLUCOSE 420 (H) 05/01/2021   GLUCOSE 212 (H) 04/16/2021   GLUCOSE 111 (H) 01/12/2021   BUN 22 05/01/2021   BUN 14 04/16/2021   BUN 16 01/12/2021   CREATININE 1.31 (H) 05/01/2021   CREATININE 1.09 04/16/2021   CREATININE 1.01 01/12/2021   CALCIUM 9.7 05/01/2021   CALCIUM 9.2 04/16/2021   CALCIUM 8.6 01/12/2021   LFT Recent Labs    05/01/21 1025  PROT 7.3  ALBUMIN  3.4*  AST 48*  ALT 74*  ALKPHOS 252*  BILITOT 1.3*   PT/INR No results found  for: INR, PROTIME Hepatitis Panel No results for input(s): HEPBSAG, HCVAB, HEPAIGM, HEPBIGM in the last 72 hours. C-Diff No components found for: CDIFF Lipase     Component Value Date/Time   LIPASE 25 05/01/2021 1025    Drugs of Abuse  No results found for: LABOPIA, COCAINSCRNUR, LABBENZ, AMPHETMU, THCU, LABBARB   RADIOLOGY STUDIES: MR ABDOMEN MRCP W WO CONTAST  Result Date: 05/01/2021 CLINICAL DATA:  Abdominal pain, biliary obstruction suspected, elevated bilirubin EXAM: MRI ABDOMEN WITHOUT AND WITH CONTRAST (INCLUDING MRCP) TECHNIQUE: Multiplanar multisequence MR imaging of the abdomen was performed both before and after the administration of intravenous contrast. Heavily T2-weighted images of the biliary and pancreatic ducts were obtained, and three-dimensional MRCP images were rendered by post processing. CONTRAST:  87m MULTIHANCE GADOBENATE DIMEGLUMINE 529 MG/ML IV SOLN COMPARISON:  CT abdomen pelvis, 04/23/2021 FINDINGS: Lower chest: No acute findings. Hepatobiliary: No mass or other parenchymal abnormality identified. Status post cholecystectomy. No biliary ductal dilatation. There are three small adjacent gallstones in the central common bile duct measuring no greater than 3-4 mm (series 5, image 23, series 9, image 26). Pancreas: No mass, inflammatory changes, or other parenchymal abnormality identified. No pancreatic ductal dilatation. Spleen:  Within normal limits in size and appearance. Adrenals/Urinary Tract: No masses identified. Left parapelvic cyst. No evidence of hydronephrosis. Stomach/Bowel: Visualized portions within the abdomen are unremarkable. Vascular/Lymphatic: No pathologically enlarged lymph nodes identified. No abdominal aortic aneurysm demonstrated. Aortic atherosclerosis. Other:  None. Musculoskeletal: No suspicious bone lesions identified. IMPRESSION: 1. There are three small adjacent gallstones in the central common bile duct measuring no greater than 3-4 mm. No  biliary ductal dilatation. 2. Status post cholecystectomy. These results will be called to the ordering clinician or representative by the Radiologist Assistant, and communication documented in the PACS or [employer] Aortic Atherosclerosis (ICD10-I70.0). Electronically Signed   By: AEddie CandleM.D.   On: 05/01/2021 08:16     IMPRESSION:      Choledocholithiasis.  This may be intermittently obstructing as he is having intermittent abdominal pain, nausea and deeply pigmented urine.  Had a fever almost a week ago.  Current LFTs improved compared with 3 weeks ago and MRCP shows non-obstructing CBD stones.  Gallbladder surgically absent. Choledocholithiasis dates back to 2013.  IOC at time of ERCP 2013 was negative.       IDDM.  Current serum blood glucose is 420.   PLAN:        ?  Admit and arrange for ERCP in the next few days and initiate antibiotics vs send home on abx and arrange outpt ERCP?   SAzucena Freed 05/01/2021, 3:23 PM Phone [phone]  ________________________________________________________________________  LVelora HecklerGI MD note:  I personally examined the patient, reviewed the data and agree with the assessment and plan described above.  He has had CBD stones since MRI and Lap chole IOC 8-9 years ago.  Not sure why this has not been addressed before now despite the fact that (per patient and his wife) he has had intermittent fevers with transient LFT elevation for many many years.  I do not think he needs to be hospitalized this holiday weekend for this.  Instead, even though I have a very low suspicion of current cholangitis (no fevers, normal WBC) he will complete a 5 day course of oral antibiotics and we will look for a time to help  him with an ERCP.  Owens Loffler, MD [facility] Pager [phone]

## 2021-05-01 NOTE — ED Provider Notes (Signed)
[facility] EMERGENCY DEPARTMENT Provider Note   CSN: [id number] Arrival date & time: 05/01/21  1018     History Chief Complaint  Patient presents with   Abdominal Pain    Andre Shaffer is a 80 y.o. male with a history of diabetes mellitus, GERD, hyperlipidemia, hypertension, cholecystectomy 2013 performed by Dr. Rosendo Gros.  Presents to the emergency department with a chief complaint of abdominal pain.  Patient states that he had a MRI performed yesterday and was told by his primary care provider and gastroenterologist Dr. Lyndel Safe to come to the emergency department due to acute findings.  Patient states that he has been having abdominal pain for the past month.  Patient reports that pain is across his abdomen above the umbilicus.  Patient denies any radiation of his pain.  Pain has gradually improved throughout the month.  At present patient rates pain 1/10 on the pain scale.  Patient denies any alleviating or aggravating factors.  Patient reports that he has had elevated liver enzymes, alk phos, and total bili over this last month.  Has been following up with his primary care provider.  Patient has referral to [facility] gastroenterology to see Dr. Lyndel Safe.  Patient denies any nausea, vomiting, diarrhea, urinary symptoms, blood in stool, melena, fever or chills.   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    Patient  Active Problem List   Diagnosis Date Noted   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   Hypertension 01/17/2021   Diabetic polyneuropathy associated with type 2 diabetes mellitus ([place]) 06/22/2020   Hypertensive heart disease, benign, 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 01/25/2020   Elevated liver transaminase level 01/25/2020   Class 2 severe obesity due to excess calories with serious comorbidity and body mass index (BMI) of 35.0 to 35.9 in adult [facility]) 01/25/2020   Dysuria 12/19/2019   Mixed hyperlipidemia 02/17/2009   PERIPHERAL NEUROPATHY 02/17/2009   Essential hypertension, benign 02/17/2009   DYSPNEA 02/17/2009   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    Past Surgical History:  Procedure Laterality Date   ANKLE SURGERY  93,03   rt and lt    CHOLECYSTECTOMY  10/19/2012   Procedure: LAPAROSCOPIC CHOLECYSTECTOMY WITH INTRAOPERATIVE CHOLANGIOGRAM;  Surgeon: Ralene Ok, MD;  Location: [facility];  Service: General;  Laterality: N/A;   COLONOSCOPY     CORONARY ARTERY BYPASS GRAFT  1997   SKIN CANCER EXCISION  12/2020   Squamous Cell Cancer   TIBIA FRACTURE SURGERY         No family history on file.  Social History   Tobacco Use   Smoking status: Former    Pack years: 0.00    Types: Cigarettes    Quit date: 10/19/1975    Years since quitting: 45.5   Smokeless tobacco: Never  Substance Use Topics   Alcohol use: No   Drug use: No    Home Medications Prior to Admission  medications   Medication Sig Start Date End Date Taking? Authorizing Provider  aspirin EC 325 MG tablet Take 325 mg by mouth daily.   Yes [provider]  Cyanocobalamin 1000 MCG/ML LIQD Take 2,500 mcg by mouth.    [provider]  ezetimibe (ZETIA) 10 MG tablet TAKE ONE (1) TABLET BY MOUTH ONCE DAILY 04/17/21   Lillard Anes, MD  FREESTYLE LITE test strip USE TO CHECK BLOOD SUGAR TWICE DAILY 04/10/20   Cox, Elnita Maxwell, MD  gabapentin (NEURONTIN) 300 MG capsule TAKE ONE CAPSULE BY  MOUTH TWICE A DAY 05/26/20   Cox, Kirsten, MD  ibuprofen (ADVIL,MOTRIN) 200 MG tablet Take 200 mg by mouth every 8 (eight) hours as needed for moderate pain.    [provider]  insulin lispro protamine-lispro (HUMALOG 75/25 MIX) (75-25) 100 UNIT/ML SUSP injection Inject 100 Units into the skin 2 (two) times daily with a meal. Inject 60 units every am and 40 units every pm 12/25/19   Cox, Kirsten, MD  metFORMIN (GLUCOPHAGE) 1000 MG tablet TAKE ONE TABLET TWICE DAILY 10/21/20   Cox, Kirsten, MD  metoprolol succinate (TOPROL-XL) 25 MG 24 hr tablet TAKE ONE (1) TABLET ONCE DAILY 03/02/21   Cox, Kirsten, MD  telmisartan (MICARDIS) 80 MG tablet TAKE ONE (1) TABLET BY MOUTH ONCE DAILY 03/02/21   Cox, Kirsten, MD    Allergies    Nitroglycerin, Statins, Tetanus toxoid, and Penicillins  Review of Systems   Review of Systems  Constitutional:  Negative for chills and fever.  Eyes:  Negative for visual disturbance.  Respiratory:  Negative for shortness of breath.   Cardiovascular:  Negative for chest pain.  Gastrointestinal:  Positive for abdominal pain. Negative for abdominal distention, blood in stool, constipation, diarrhea, nausea and vomiting.  Genitourinary:  Negative for difficulty urinating, dysuria, flank pain, frequency, genital sores, hematuria, penile pain, penile swelling, scrotal swelling and testicular pain.  Musculoskeletal:  Negative for back pain and neck pain.  Skin:  Negative for color change and rash.  Neurological:  Negative for dizziness, syncope, light-headedness and headaches.  Psychiatric/Behavioral:  Negative for confusion.    Physical Exam Updated Vital Signs BP 134/73 (BP Location: Right Arm)   Pulse 61   Temp 98.2 F (36.8 C) (Oral)   Resp 20   Ht _0  (1.854 m)   Wt 113.4 kg   SpO2 96%   BMI 32.98 kg/m   Physical Exam Vitals and nursing note reviewed.  Constitutional:      General: He is not in acute distress.    Appearance: He is not ill-appearing,  toxic-appearing or diaphoretic.  HENT:     Head: Normocephalic.  Eyes:     General: No scleral icterus.       Right eye: No discharge.        Left eye: No discharge.  Cardiovascular:     Rate and Rhythm: Normal rate.  Pulmonary:     Effort: Pulmonary effort is normal.  Abdominal:     General: Abdomen is protuberant. Bowel sounds are normal. There is no distension. There are no signs of injury.     Palpations: Abdomen is soft. There is no mass or pulsatile mass.     Tenderness: There is abdominal tenderness in the periumbilical area. There is no guarding or rebound.     Hernia: There is no hernia in the umbilical area or ventral area.     Comments: Patient has minimal tenderness above umbilicus  Skin:    General: Skin  is warm and dry.  Neurological:     General: No focal deficit present.     Mental Status: He is alert.     GCS: GCS eye subscore is 4. GCS verbal subscore is 5. GCS motor subscore is 6.  Psychiatric:        Behavior: Behavior is cooperative.    ED Results / Procedures / Treatments   Labs (all labs ordered are listed, but only abnormal results are displayed) Labs Reviewed  COMPREHENSIVE METABOLIC PANEL - Abnormal; Notable for the following components:      Result Value   Sodium 134 (*)    Glucose, Bld 420 (*)    Creatinine, Ser 1.31 (*)    Albumin 3.4 (*)    AST 48 (*)    ALT 74 (*)    Alkaline Phosphatase 252 (*)    Total Bilirubin 1.3 (*)    GFR, Estimated 55 (*)    All other components within normal limits  CBG MONITORING, ED - Abnormal; Notable for the following components:   Glucose-Capillary 316 (*)    All other components within normal limits  CBC WITH DIFFERENTIAL/PLATELET  LIPASE, BLOOD    EKG None  Radiology MR ABDOMEN MRCP W WO CONTAST  Result Date: 05/01/2021 CLINICAL DATA:  Abdominal pain, biliary obstruction suspected, elevated bilirubin EXAM: MRI ABDOMEN WITHOUT AND WITH CONTRAST (INCLUDING MRCP) TECHNIQUE: Multiplanar multisequence MR  imaging of the abdomen was performed both before and after the administration of intravenous contrast. Heavily T2-weighted images of the biliary and pancreatic ducts were obtained, and three-dimensional MRCP images were rendered by post processing. CONTRAST:  22m MULTIHANCE GADOBENATE DIMEGLUMINE 529 MG/ML IV SOLN COMPARISON:  CT abdomen pelvis, 04/23/2021 FINDINGS: Lower chest: No acute findings. Hepatobiliary: No mass or other parenchymal abnormality identified. Status post cholecystectomy. No biliary ductal dilatation. There are three small adjacent gallstones in the central common bile duct measuring no greater than 3-4 mm (series 5, image 23, series 9, image 26). Pancreas: No mass, inflammatory changes, or other parenchymal abnormality identified. No pancreatic ductal dilatation. Spleen:  Within normal limits in size and appearance. Adrenals/Urinary Tract: No masses identified. Left parapelvic cyst. No evidence of hydronephrosis. Stomach/Bowel: Visualized portions within the abdomen are unremarkable. Vascular/Lymphatic: No pathologically enlarged lymph nodes identified. No abdominal aortic aneurysm demonstrated. Aortic atherosclerosis. Other:  None. Musculoskeletal: No suspicious bone lesions identified. IMPRESSION: 1. There are three small adjacent gallstones in the central common bile duct measuring no greater than 3-4 mm. No biliary ductal dilatation. 2. Status post cholecystectomy. These results will be called to the ordering clinician or representative by the Radiologist Assistant, and communication documented in the PACS or [employer] Aortic Atherosclerosis (ICD10-I70.0). Electronically Signed   By: AEddie CandleM.D.   On: 05/01/2021 08:16    Procedures Procedures   Medications Ordered in ED Medications  0.9 %  sodium chloride infusion (has no administration in time range)    ED Course  I have reviewed the triage vital signs and the nursing notes.  Pertinent labs & imaging results  that were available during my care of the patient were reviewed by me and considered in my medical decision making (see chart for details).    MDM Rules/Calculators/A&P                          Alert 80year old male no acute distress, nontoxic-appearing presents emergency department with a chief complaint of abdominal pain x1 month.  Reports that pain has  gradually improved over this month.  Has been following up with his primary care provider for this issue.  Was told by his primary care provider to come to the emergency department today due to findings of MRCP performed yesterday.  On physical exam abdomen soft, nondistended, mild tenderness above umbilicus.  No guarding or rebound tenderness.  No ventral or umbilical hernia noted.  Patient has CT abdomen pelvis performed on 04/23/2021 which showed no acute abnormality.  Patient had MRCP performed yesterday which showed three small adjacent gallstones in the central common bile duct measuring no greater than 3-4 mm. No biliary ductal dilatation.  Lab work was obtained while patient was in triage.  Lipase with normal limits. CBC is unremarkable. CMP shows AST, ALT, alk phos and total bili are elevated.  These values have improved from labs taken 2 weeks prior. Patient noted to have increased creatinine at 1.31; will start patient on maintenance fluids.  Will consult gastroenterology due to patient's MRCP findings. 1530 spoke to PA [place] will have gastroenterology evaluate the patient in the ED.  Gastroenterology team does not feel patient needs admission at this time.  Recommend 5-day course of Cipro and follow-up with Dr. Lyndel Safe in outpatient setting.  Will discharge patient at this time.  Patient given strict return precautions.  Patient expressed understanding of all instructions and is agreeable with this plan.  Final Clinical Impression(s) / ED Diagnoses Final diagnoses:  Choledocholithiasis  AKI (acute kidney injury) ([place])     Rx / DC Orders ED Discharge Orders          Ordered    ciprofloxacin (CIPRO) 500 MG tablet  Every 12 hours        05/01/21 1645             Loni Beckwith, PA-C 05/01/21 2100    HortonAlvin Critchley, DO 05/04/21 1746

## 2021-05-05 ENCOUNTER — Telehealth: Payer: Self-pay

## 2021-05-05 ENCOUNTER — Other Ambulatory Visit: Payer: Self-pay

## 2021-05-05 DIAGNOSIS — K831 Obstruction of bile duct: Secondary | ICD-10-CM

## 2021-05-05 NOTE — Telephone Encounter (Signed)
Pt's wife Inez Catalina called looking to schedule appt for pt . Pls call her at [phone].

## 2021-05-05 NOTE — Telephone Encounter (Signed)
ERCP scheduled for 06/11/21 at [facility] with Dr Ardis Hughs.    ERCP scheduled, pt instructed and medications reviewed.  Patient instructions mailed to home.  Patient to call with any questions or concerns.

## 2021-05-05 NOTE — Telephone Encounter (Signed)
-----   Message from Milus Banister, MD sent at 05/01/2021  4:34 PM EDT ----- [place] fellow biliary MDs, See my ER consult note from today. Andre Shaffer patient.  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  MRI yesterday confirmed the stones are still present and he was told by his PCP to come to [facility] urgently today.  I did not think he needed to be admitted for this current episode especially since he does not have fevers or WBC for this holiday weekend that has [facility] coverage.  He is going home with cipro 5 days (being overly cautious) and I told him we would find a time for ERCP as an outpatient.  My first availability is August 11 which is certainly safe given that this has been going on for years. I told him I would reach out to see if it could be done sooner if anyone is interested and has time in a purple or yellow spot.    Kingsley Herandez, Can you contact him and offer August 11th ERCP  with me unless one of the other biliary MDs responds with a sooner time.  Thanks

## 2021-05-11 ENCOUNTER — Other Ambulatory Visit: Payer: Self-pay | Admitting: Family Medicine

## 2021-05-12 ENCOUNTER — Other Ambulatory Visit: Payer: Self-pay

## 2021-05-12 ENCOUNTER — Ambulatory Visit (INDEPENDENT_AMBULATORY_CARE_PROVIDER_SITE_OTHER): Payer: PPO | Admitting: Family Medicine

## 2021-05-12 ENCOUNTER — Encounter: Payer: Self-pay | Admitting: Family Medicine

## 2021-05-12 VITALS — BP 124/68 | HR 77 | Temp 97.3°F | Ht 74.0 in | Wt 252.0 lb

## 2021-05-12 DIAGNOSIS — I119 Hypertensive heart disease without heart failure: Secondary | ICD-10-CM | POA: Diagnosis not present

## 2021-05-12 DIAGNOSIS — K8050 Calculus of bile duct without cholangitis or cholecystitis without obstruction: Secondary | ICD-10-CM | POA: Diagnosis not present

## 2021-05-12 DIAGNOSIS — E782 Mixed hyperlipidemia: Secondary | ICD-10-CM | POA: Diagnosis not present

## 2021-05-12 DIAGNOSIS — E1142 Type 2 diabetes mellitus with diabetic polyneuropathy: Secondary | ICD-10-CM | POA: Diagnosis not present

## 2021-05-12 MED ORDER — DAPAGLIFLOZIN PROPANEDIOL 10 MG PO TABS: 10.0000 mg | ORAL_TABLET | Freq: Every day | ORAL | 0 refills | Status: DC

## 2021-05-12 NOTE — Progress Notes (Signed)
Subjective:  Patient ID: Andre Shaffer, male    DOB: November 21, 1940  Age: 80 y.o. MRN: [id number]  Chief Complaint  Patient presents with   Diabetes   Hyperlipidemia    HPI Mixed hyperlipidemia Dxd in 1993. Patient takes zetia 10 mg daily and maintains a healthy diet.  Type 2 diabetes mellitus with hyperglycemia, with long-term current use of insulin ([place]) Dxd in 1993. Checks feet and FBS daily, FBS ranges 110-210, has annual eye exam scheduled in 11/22 by the [place]. Takes metformin 1000 mg BID and Humalog 60 units in the a.m. and 40 units in the evening.  Hypertensive heart disease: Controlled with metoprolol 25 mg daily and telmisartan 80 mg daily.  Elevated liver enzymes (also bilirubin) and fever/abdominal pain saw Dr. Ardis Hughs at the ED. I sent him there for intrahepatic ductal stones found on Given cipro and ERCP I scheduled on 06/11/2021. Avoid fat in diet.    Current Outpatient Medications on File Prior to Visit  Medication Sig Dispense Refill   aspirin EC 325 MG tablet Take 325 mg by mouth daily.     Cyanocobalamin 1000 MCG/ML LIQD Take 2,500 mcg by mouth See admin instructions. Place 0.5 dropperful (2,500 mcg) sublingually daily     ezetimibe (ZETIA) 10 MG tablet TAKE ONE (1) TABLET BY MOUTH ONCE DAILY (Patient taking differently: Take 10 mg by mouth every evening.) 90 tablet 1   FREESTYLE LITE test strip USE TO CHECK BLOOD SUGAR TWICE DAILY 200 each 2   gabapentin (NEURONTIN) 300 MG capsule TAKE ONE CAPSULE BY MOUTH TWICE A DAY (Patient taking differently: Take 300 mg by mouth at bedtime as needed (for neuropathy).) 180 capsule 3   ibuprofen (ADVIL,MOTRIN) 200 MG tablet Take 200 mg by mouth every 8 (eight) hours as needed for moderate pain or mild pain.     insulin lispro protamine-lispro (HUMALOG 75/25 MIX) (75-25) 100 UNIT/ML SUSP injection Inject 100 Units into the skin 2 (two) times daily with a meal. Inject 60 units every am and 40 units every pm (Patient taking differently:  Inject 40-60 Units into the skin See admin instructions. Inject 60 units into the skin before breakfast and 40 units units before supper/evening meal) 10 mL 0   metFORMIN (GLUCOPHAGE) 1000 MG tablet TAKE ONE TABLET TWICE DAILY 180 tablet 1   metoprolol succinate (TOPROL-XL) 25 MG 24 hr tablet TAKE ONE (1) TABLET ONCE DAILY (Patient taking differently: Take 25 mg by mouth in the morning.) 90 tablet 1   telmisartan (MICARDIS) 80 MG tablet TAKE ONE (1) TABLET BY MOUTH ONCE DAILY (Patient taking differently: Take 80 mg by mouth in the morning.) 90 tablet 1   No current facility-administered medications on file prior to visit.   Past Medical History:  Diagnosis Date   Abnormal liver function    Arthritis    Atherosclerotic heart disease of native coronary artery without angina pectoris    Diabetes mellitus without complication (HCC)    GERD (gastroesophageal reflux disease)    Hyperlipemia    Hypertension    MI (myocardial infarction) ([place])    SCC (squamous cell carcinoma), arm, left    SCC (squamous cell carcinoma), arm, 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   Past Surgical History:  Procedure Laterality Date   ANKLE SURGERY  93,03   rt and lt    CHOLECYSTECTOMY  10/19/2012   Procedure: LAPAROSCOPIC CHOLECYSTECTOMY WITH INTRAOPERATIVE CHOLANGIOGRAM;  Surgeon: Ralene Ok, MD;  Location: [facility];  Service: General;  Laterality: N/A;   COLONOSCOPY  CORONARY ARTERY BYPASS GRAFT  1997   SKIN CANCER EXCISION  12/2020   Squamous Cell Cancer   TIBIA FRACTURE SURGERY      No family history on file. Social History   Socioeconomic History   Marital status: Married    Spouse name: Not on file   Number of children: Not on file   Years of education: Not on file   Highest education level: Not on file  Occupational History   Not on file  Tobacco Use   Smoking status: Former    Pack years: 0.00    Types: Cigarettes    Quit date: 10/19/1975    Years since quitting: 45.5   Smokeless  tobacco: Never  Substance and Sexual Activity   Alcohol use: No   Drug use: No   Sexual activity: Not on file  Other Topics Concern   Not on file  Social History Narrative   Not on file   Social Determinants of Health   Financial Resource Strain: Not on file  Food Insecurity: Not on file  Transportation Needs: Not on file  Physical Activity: Not on file  Stress: Not on file  Social Connections: Not on file    Review of Systems  Constitutional:  Negative for chills, diaphoresis, fatigue and fever (2-3 weeks ago. Resolved).  HENT:  Positive for rhinorrhea. Negative for congestion, ear pain and sore throat.   Respiratory:  Negative for cough and shortness of breath.   Cardiovascular:  Negative for chest pain and leg swelling.  Gastrointestinal:  Negative for abdominal pain, constipation, diarrhea, nausea and vomiting.  Genitourinary:  Negative for dysuria and urgency.  Musculoskeletal:  Positive for arthralgias (Knee pain). Negative for back pain and myalgias.  Neurological:  Negative for dizziness and headaches.  Psychiatric/Behavioral:  Negative for dysphoric mood.     Objective:  BP 124/68   Pulse 77   Temp (!) 97.3 F (36.3 C)   Ht 6\' 2"  (1.88 m)   Wt 252 lb (114.3 kg)   SpO2 99%   BMI 32.35 kg/m   BP/Weight 05/12/2021 05/01/2021 2/94/7654  Systolic BP 650 354 656  Diastolic BP 68 68 70  Wt. (Lbs) 252 250 252  BMI 32.35 32.98 32.35    Physical Exam Vitals reviewed.  Constitutional:      Appearance: Normal appearance. He is obese.  Neck:     Vascular: No carotid bruit.  Cardiovascular:     Rate and Rhythm: Normal rate and regular rhythm.     Pulses: Normal pulses.     Heart sounds: Normal heart sounds.  Pulmonary:     Effort: Pulmonary effort is normal.     Breath sounds: Normal breath sounds. No wheezing, rhonchi or rales.  Abdominal:     General: Bowel sounds are normal.     Palpations: Abdomen is soft.     Tenderness: There is no abdominal tenderness.   Neurological:     Mental Status: He is alert.  Psychiatric:        Mood and Affect: Mood normal.        Behavior: Behavior normal.    Diabetic Foot Exam - Simple   Simple Foot Form Diabetic Foot exam was performed with the following findings: Yes 05/12/2021  9:15 AM  Visual Inspection See comments: Yes Sensation Testing See comments: Yes Pulse Check Posterior Tibialis and Dorsalis pulse intact bilaterally: Yes Comments Dry skin. Calluses. Decreased sensation BL.       Lab Results  Component Value Date  WBC 5.6 05/01/2021   HGB 14.9 05/01/2021   HCT 44.8 05/01/2021   PLT 187 05/01/2021   GLUCOSE 420 (H) 05/01/2021   CHOL 140 04/16/2021   TRIG 139 04/16/2021   HDL 26 (L) 04/16/2021   LDLCALC 89 04/16/2021   ALT 74 (H) 05/01/2021   AST 48 (H) 05/01/2021   NA 134 (L) 05/01/2021   K 4.8 05/01/2021   CL 102 05/01/2021   CREATININE 1.31 (H) 05/01/2021   BUN 22 05/01/2021   CO2 22 05/01/2021   TSH 3.590 10/06/2020   HGBA1C 7.9 (H) 04/16/2021   MICROALBUR 150 04/16/2021      Assessment & Plan:   1. Mixed hyperlipidemia Unable to take statins.  Recommend continue to work on eating healthy diet and exercise.  2. Diabetic polyneuropathy associated with type 2 diabetes mellitus ([place]) Start farxiga 10 mg once daily  Continue other medications.  Send sugars in one month.  Refer to ccm  3. Essential hypertension, benign with known heart diseases but no CHF>  Well controlled.  No changes to medicines.  Continue to work on eating a healthy diet and exercise.   4. Intrahepatic bile duct stones  Keep appt with GI. If develops fever, abdominal pain, nausea, or vomiting I would recommend pt call gi.    Follow-up: Return in about 10 weeks (around 07/21/2021) for fasting.  An After Visit Summary was printed and given to the patient.  Rochel Brome, MD [facility] [phone]

## 2021-05-12 NOTE — Progress Notes (Deleted)
Acute Office Visit  Subjective:    Patient ID: Andre Shaffer, male    DOB: Apr 12, 1941, 80 y.o.   MRN: [id number]  Chief Complaint  Patient presents with   Diabetes   Hyperlipidemia    HPI Patient is in today for ***  Past Medical History:  Diagnosis Date   Abnormal liver function    Arthritis    Atherosclerotic heart disease of native coronary artery without angina pectoris    Diabetes mellitus without complication (HCC)    GERD (gastroesophageal reflux disease)    Hyperlipemia    Hypertension    MI (myocardial infarction) ([place])    SCC (squamous cell carcinoma), arm, left    SCC (squamous cell carcinoma), arm, 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    Past Surgical History:  Procedure Laterality Date   ANKLE SURGERY  93,03   rt and lt    CHOLECYSTECTOMY  10/19/2012   Procedure: LAPAROSCOPIC CHOLECYSTECTOMY WITH INTRAOPERATIVE CHOLANGIOGRAM;  Surgeon: Ralene Ok, MD;  Location: [place];  Service: General;  Laterality: N/A;   COLONOSCOPY     CORONARY ARTERY BYPASS GRAFT  1997   SKIN CANCER EXCISION  12/2020   Squamous Cell Cancer   TIBIA FRACTURE SURGERY      No family history on file.  Social History   Socioeconomic History   Marital status: Married    Spouse name: Not on file   Number of children: Not on file   Years of education: Not on file   Highest education level: Not on file  Occupational History   Not on file  Tobacco Use   Smoking status: Former    Pack years: 0.00    Types: Cigarettes    Quit date: 10/19/1975    Years since quitting: 45.5   Smokeless tobacco: Never  Substance and Sexual Activity   Alcohol use: No   Drug use: No   Sexual activity: Not on file  Other Topics Concern   Not on file  Social History Narrative   Not on file   Social Determinants of Health   Financial Resource Strain: Not on file  Food Insecurity: Not on file  Transportation Needs: Not on file  Physical Activity: Not on file  Stress: Not on file  Social  Connections: Not on file  Intimate Partner Violence: Not on file    Outpatient Medications Prior to Visit  Medication Sig Dispense Refill   aspirin EC 325 MG tablet Take 325 mg by mouth daily.     Cyanocobalamin 1000 MCG/ML LIQD Take 2,500 mcg by mouth See admin instructions. Place 0.5 dropperful (2,500 mcg) sublingually daily     ezetimibe (ZETIA) 10 MG tablet TAKE ONE (1) TABLET BY MOUTH ONCE DAILY (Patient taking differently: Take 10 mg by mouth every evening.) 90 tablet 1   FREESTYLE LITE test strip USE TO CHECK BLOOD SUGAR TWICE DAILY 200 each 2   gabapentin (NEURONTIN) 300 MG capsule TAKE ONE CAPSULE BY MOUTH TWICE A DAY (Patient taking differently: Take 300 mg by mouth at bedtime as needed (for neuropathy).) 180 capsule 3   ibuprofen (ADVIL,MOTRIN) 200 MG tablet Take 200 mg by mouth every 8 (eight) hours as needed for moderate pain or mild pain.     insulin lispro protamine-lispro (HUMALOG 75/25 MIX) (75-25) 100 UNIT/ML SUSP injection Inject 100 Units into the skin 2 (two) times daily with a meal. Inject 60 units every am and 40 units every pm (Patient taking differently: Inject 40-60 Units into the skin See admin instructions.  Inject 60 units into the skin before breakfast and 40 units units before supper/evening meal) 10 mL 0   metFORMIN (GLUCOPHAGE) 1000 MG tablet TAKE ONE TABLET TWICE DAILY 180 tablet 1   metoprolol succinate (TOPROL-XL) 25 MG 24 hr tablet TAKE ONE (1) TABLET ONCE DAILY (Patient taking differently: Take 25 mg by mouth in the morning.) 90 tablet 1   telmisartan (MICARDIS) 80 MG tablet TAKE ONE (1) TABLET BY MOUTH ONCE DAILY (Patient taking differently: Take 80 mg by mouth in the morning.) 90 tablet 1   ciprofloxacin (CIPRO) 500 MG tablet Take 1 tablet (500 mg total) by mouth every 12 (twelve) hours. 10 tablet 0   No facility-administered medications prior to visit.    Allergies  Allergen Reactions   Nitroglycerin Other (See Comments)    Blood pressure 'bottomed out'    Statins Other (See Comments)    Elevation of liver enzymes   Tetanus Toxoid Other (See Comments)    Severe flu symptoms fever and chills   Penicillins Rash    Review of Systems     Objective:    Physical Exam  BP 124/68   Pulse 77   Temp (!) 97.3 F (36.3 C)   Ht _0  (1.88 m)   Wt 252 lb (114.3 kg)   SpO2 99%   BMI 32.35 kg/m  Wt Readings from Last 3 Encounters:  05/12/21 252 lb (114.3 kg)  05/01/21 250 lb (113.4 kg)  04/16/21 252 lb (114.3 kg)    Health Maintenance Due  Topic Date Due   OPHTHALMOLOGY EXAM  Never done   TETANUS/TDAP  Never done   Zoster Vaccines- Shingrix (1 of 2) Never done   PNA vac Low Risk Adult (2 of 2 - PCV13) 08/02/2015    There are no preventive care reminders to display for this patient.   Lab Results  Component Value Date   TSH 3.590 10/06/2020   Lab Results  Component Value Date   WBC 5.6 05/01/2021   HGB 14.9 05/01/2021   HCT 44.8 05/01/2021   MCV 97.4 05/01/2021   PLT 187 05/01/2021   Lab Results  Component Value Date   NA 134 (L) 05/01/2021   K 4.8 05/01/2021   CO2 22 05/01/2021   GLUCOSE 420 (H) 05/01/2021   BUN 22 05/01/2021   CREATININE 1.31 (H) 05/01/2021   BILITOT 1.3 (H) 05/01/2021   ALKPHOS 252 (H) 05/01/2021   AST 48 (H) 05/01/2021   ALT 74 (H) 05/01/2021   PROT 7.3 05/01/2021   ALBUMIN 3.4 (L) 05/01/2021   CALCIUM 9.7 05/01/2021   ANIONGAP 10 05/01/2021   EGFR 68 04/16/2021   Lab Results  Component Value Date   CHOL 140 04/16/2021   Lab Results  Component Value Date   HDL 26 (L) 04/16/2021   Lab Results  Component Value Date   LDLCALC 89 04/16/2021   Lab Results  Component Value Date   TRIG 139 04/16/2021   Lab Results  Component Value Date   CHOLHDL 5.4 (H) 04/16/2021   Lab Results  Component Value Date   HGBA1C 7.9 (H) 04/16/2021       Assessment & Plan:   Problem List Items Addressed This Visit       Cardiovascular and Mediastinum   Essential hypertension, benign      Other   Mixed hyperlipidemia - Primary   Other Visit Diagnoses     Type 2 diabetes mellitus with hyperglycemia, with long-term current use of insulin ([place])  No orders of the defined types were placed in this encounter.    Gayleen Orem, RN

## 2021-05-12 NOTE — Patient Instructions (Signed)
Start on Farxiga 10 mg once daily.  Possible side effects are yeast infections and bladder infections (only about 6% of people have this side effect)

## 2021-05-13 ENCOUNTER — Telehealth: Payer: Self-pay | Admitting: Family Medicine

## 2021-05-13 NOTE — Chronic Care Management (AMB) (Signed)
   Chronic Care Management   Outreach Note  05/13/2021 Name: Andre Shaffer MRN: [id number] DOB: [birth year]  Referred by: Rochel Brome, MD Reason for referral : No chief complaint on file.   An unsuccessful telephone outreach was attempted today. The patient was referred to the pharmacist for assistance with care management and care coordination.   Follow Up Plan:   Tatjana Dellinger Upstream Scheduler

## 2021-06-03 ENCOUNTER — Encounter (HOSPITAL_COMMUNITY): Payer: Self-pay | Admitting: Gastroenterology

## 2021-06-03 ENCOUNTER — Other Ambulatory Visit: Payer: Self-pay

## 2021-06-11 ENCOUNTER — Telehealth: Payer: Self-pay | Admitting: Gastroenterology

## 2021-06-11 ENCOUNTER — Encounter: Payer: Self-pay | Admitting: Nurse Practitioner

## 2021-06-11 ENCOUNTER — Telehealth (INDEPENDENT_AMBULATORY_CARE_PROVIDER_SITE_OTHER): Payer: PPO | Admitting: Nurse Practitioner

## 2021-06-11 VITALS — Temp 99.8°F

## 2021-06-11 DIAGNOSIS — U071 COVID-19: Secondary | ICD-10-CM

## 2021-06-11 SURGERY — ENDOSCOPIC RETROGRADE CHOLANGIOPANCREATOGRAPHY (ERCP) WITH PROPOFOL
Anesthesia: General

## 2021-06-11 MED ORDER — FLUTICASONE PROPIONATE 50 MCG/ACT NA SUSP: 2.0000 | Freq: Every day | NASAL | 6 refills | Status: DC

## 2021-06-11 MED ORDER — NIRMATRELVIR/RITONAVIR (PAXLOVID)TABLET: 3.0000 | ORAL_TABLET | Freq: Two times a day (BID) | ORAL | 0 refills | Status: AC

## 2021-06-11 NOTE — Progress Notes (Signed)
Virtual Visit via Video Note   This visit type was conducted due to national recommendations for restrictions regarding the COVID-19 Pandemic (e.g. social distancing) in an effort to limit this patient's exposure and mitigate transmission in our community.  Due to his co-morbid illnesses, this patient is at least at moderate risk for complications without adequate follow up.  This format is felt to be most appropriate for this patient at this time.  All issues noted in this document were discussed and addressed.  A limited physical exam was performed with this format.  A verbal consent was obtained for the virtual visit.   Date:  06/11/2021   ID:  Andre Shaffer, DOB 06-16-1941, MRN [id number]  Patient Location: Home Provider Location: Office/Clinic  PCP:  Rochel Brome, MD   Evaluation Performed:  Established patient, acute telemedicine visit  Chief Complaint:  Positive COVID-19 home test  History of Present Illness:    Andre Shaffer is a 80 y.o. male with positive COVID-19 home test. Onset of symptoms was yesterday that included fever, chills, headache, rhinorrhea, post-nasal-drip, and cough. He was scheduled to undergo surgical procedure to remove stones from intrahepatic duct. Unfortunately procedure was cancelled due to positive COVID-19 test. He has been experiencing elevated liver enzymes, elevated bilirubin, abd pain, and decreased appetite for several weeks related to biliary obstruction. He underwent cholecystectomy several years ago.  The patient does have symptoms concerning for COVID-19 infection (fever, chills, cough, or new shortness of breath).    Past Medical History:  Diagnosis Date   Abnormal liver function    Arthritis    Atherosclerotic heart disease of native coronary artery without angina pectoris    Diabetes mellitus without complication (HCC)    GERD (gastroesophageal reflux disease)    Hyperlipemia    Hypertension    MI (myocardial infarction) ([place])     SCC (squamous cell carcinoma), arm, left    SCC (squamous cell carcinoma), arm, 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    Past Surgical History:  Procedure Laterality Date   ANKLE SURGERY  93,03   rt and lt    CHOLECYSTECTOMY  10/19/2012   Procedure: LAPAROSCOPIC CHOLECYSTECTOMY WITH INTRAOPERATIVE CHOLANGIOGRAM;  Surgeon: Ralene Ok, MD;  Location: [place];  Service: General;  Laterality: N/A;   COLONOSCOPY     CORONARY ARTERY BYPASS GRAFT  1997   SKIN CANCER EXCISION  12/2020   Squamous Cell Cancer   TIBIA FRACTURE SURGERY      History reviewed. No pertinent family history.  Social History   Socioeconomic History   Marital status: Married    Spouse name: Not on file   Number of children: Not on file   Years of education: Not on file   Highest education level: Not on file  Occupational History   Not on file  Tobacco Use   Smoking status: Former    Types: Cigarettes    Quit date: 10/19/1975    Years since quitting: 45.6   Smokeless tobacco: Never  Substance and Sexual Activity   Alcohol use: No   Drug use: No   Sexual activity: Not on file  Other Topics Concern   Not on file  Social History Narrative   Not on file   Social Determinants of Health   Financial Resource Strain: Not on file  Food Insecurity: Not on file  Transportation Needs: Not on file  Physical Activity: Not on file  Stress: Not on file  Social Connections: Not on file  Intimate Partner Violence: Not  on file    Outpatient Medications Prior to Visit  Medication Sig Dispense Refill   aspirin EC 325 MG tablet Take 325 mg by mouth daily.     Cyanocobalamin 1000 MCG/ML LIQD Take 2,500 mcg by mouth See admin instructions. Place 0.5 dropperful (2,500 mcg) sublingually daily     dapagliflozin propanediol (FARXIGA) 10 MG TABS tablet Take 1 tablet (10 mg total) by mouth daily before breakfast. 28 tablet 0   ezetimibe (ZETIA) 10 MG tablet TAKE ONE (1) TABLET BY MOUTH ONCE DAILY (Patient taking differently:  Take 10 mg by mouth every evening.) 90 tablet 1   FREESTYLE LITE test strip USE TO CHECK BLOOD SUGAR TWICE DAILY 200 each 2   gabapentin (NEURONTIN) 300 MG capsule TAKE ONE CAPSULE BY MOUTH TWICE A DAY (Patient taking differently: Take 300 mg by mouth 2 (two) times daily as needed (for neuropathy).) 180 capsule 3   ibuprofen (ADVIL,MOTRIN) 200 MG tablet Take 200 mg by mouth every 8 (eight) hours as needed for moderate pain or mild pain.     insulin aspart protamine- aspart (NOVOLOG MIX 70/30) (70-30) 100 UNIT/ML injection Inject 40-60 Units into the skin See admin instructions. Inject 60 units into the skin before breakfast and 40 units units before supper/evening meal     insulin lispro protamine-lispro (HUMALOG 75/25 MIX) (75-25) 100 UNIT/ML SUSP injection Inject 100 Units into the skin 2 (two) times daily with a meal. Inject 60 units every am and 40 units every pm 10 mL 0   metFORMIN (GLUCOPHAGE) 1000 MG tablet TAKE ONE TABLET TWICE DAILY (Patient taking differently: Take 1,000 mg by mouth 2 (two) times daily with a meal.) 180 tablet 1   metoprolol succinate (TOPROL-XL) 25 MG 24 hr tablet TAKE ONE (1) TABLET ONCE DAILY (Patient taking differently: Take 25 mg by mouth in the morning.) 90 tablet 1   telmisartan (MICARDIS) 80 MG tablet TAKE ONE (1) TABLET BY MOUTH ONCE DAILY (Patient taking differently: Take 80 mg by mouth in the morning.) 90 tablet 1   No facility-administered medications prior to visit.    Allergies:   Nitroglycerin, Statins, Tetanus toxoid, and Penicillins   Social History   Tobacco Use   Smoking status: Former    Types: Cigarettes    Quit date: 10/19/1975    Years since quitting: 45.6   Smokeless tobacco: Never  Substance Use Topics   Alcohol use: No   Drug use: No     Review of Systems  Constitutional:  Positive for chills, fever and malaise/fatigue.  HENT:  Positive for congestion and sinus pain.        Rhinorrhea and post-nasal-drip  Eyes: Negative.    Respiratory:  Positive for cough. Negative for sputum production, shortness of breath and wheezing.   Cardiovascular:  Negative for chest pain and palpitations.  Gastrointestinal:  Positive for abdominal pain.  Genitourinary: Negative.   Musculoskeletal: Negative.   Skin:  Negative for rash.  Neurological:  Positive for headaches.  Endo/Heme/Allergies: Negative.   Psychiatric/Behavioral: Negative.      Labs/Other Tests and Data Reviewed:    Recent Labs: 10/06/2020: TSH 3.590 05/01/2021: ALT 74; BUN 22; Creatinine, Ser 1.31; Hemoglobin 14.9; Platelets 187; Potassium 4.8; Sodium 134   Recent Lipid Panel Lab Results  Component Value Date/Time   CHOL 140 04/16/2021 03:53 PM   TRIG 139 04/16/2021 03:53 PM   HDL 26 (L) 04/16/2021 03:53 PM   CHOLHDL 5.4 (H) 04/16/2021 03:53 PM   LDLCALC 89 04/16/2021 03:53 PM  Wt Readings from Last 3 Encounters:  05/12/21 252 lb (114.3 kg)  05/01/21 250 lb (113.4 kg)  04/16/21 252 lb (114.3 kg)     Objective:    Vital Signs:  Temp 99.8 F (37.7 C) (Oral)    Physical Exam Vitals reviewed.   No physical exam due to telemedicine visit  ASSESSMENT & PLAN:   1. COVID-19 - nirmatrelvir/ritonavir EUA (PAXLOVID) TABS; Take 3 tablets by mouth 2 (two) times daily for 5 days. (Take nirmatrelvir 150 mg two tablets twice daily for 5 days and ritonavir 100 mg one tablet twice daily for 5 days) Patient GFR is 55  Dispense: 30 tablet; Refill: 0 - fluticasone (FLONASE) 50 MCG/ACT nasal spray; Place 2 sprays into both nostrils daily.  Dispense: 16 g; Refill: 6     Push fluids, especially water Take Paxlovid as directed Continue symptom management with Flonase nasal spray, Ibuprofen Seek emergency medical care for severe or concerning symptoms Notify office if symptoms fail to improve or worsen   COVID-19 Education: The signs and symptoms of COVID-19 were discussed with the patient and how to seek care for testing (follow up with PCP or arrange E-visit).  The importance of social distancing was discussed today.   I spent 10 minutes dedicated to the care of this patient on the date of this encounter to include face-to-face time with the patient, as well as: EMR and prescription medication management  I, Rip Harbour, NP, have reviewed all documentation for this visit. The documentation on 06/11/21 for the exam, diagnosis, procedures, and orders are all accurate and complete.   Follow Up:  In Person prn  Signed, Rip Harbour, NP  06/11/2021 2:49 PM    [place]

## 2021-06-11 NOTE — Telephone Encounter (Signed)
Hey Dr. Ardis Hughs,   Patient have a procedure at [facility] this morning have to cancel due to testing positive for COVID. Wants a call to reschedule.  Thank you.

## 2021-06-11 NOTE — Telephone Encounter (Signed)
See alternate phone note dated 8/11.

## 2021-06-11 NOTE — Telephone Encounter (Signed)
Patty, It looks like he called late last night to cancel his ERCP because he tested positive for covid.  Can you contact him, see if he is interested in rescheduling for my next available ERCP time on a Thursday, for CBD stones.  Thanks

## 2021-06-11 NOTE — Telephone Encounter (Signed)
Left message on machine to call back  

## 2021-06-12 NOTE — Telephone Encounter (Signed)
The pt has been rescheduled to 07/16/21 at 830 am at [facility] with DJ.   ERCP scheduled, pt re-instructed and medications reviewed.  Patient instructions mailed to home.  Patient to call with any questions or concerns.

## 2021-06-27 ENCOUNTER — Telehealth: Payer: Self-pay

## 2021-06-27 NOTE — Telephone Encounter (Signed)
Called pt to schedule an AWV. Pt refused and stated "I am not getting one I will see Dr. Tobie Poet when I need to see her.  KP

## 2021-06-30 DIAGNOSIS — C4442 Squamous cell carcinoma of skin of scalp and neck: Secondary | ICD-10-CM | POA: Diagnosis not present

## 2021-07-10 NOTE — Progress Notes (Signed)
Attempted to obtain medical history via telephone, unable to reach at this time. I left a voicemail to return pre surgical testing department's phone call.  

## 2021-07-16 ENCOUNTER — Ambulatory Visit (HOSPITAL_COMMUNITY): Payer: PPO | Admitting: Anesthesiology

## 2021-07-16 ENCOUNTER — Encounter (HOSPITAL_COMMUNITY)
Admission: RE | Disposition: A | Discharge status: Home / Self Care | Payer: Self-pay | Source: Home / Self Care | Attending: Gastroenterology

## 2021-07-16 ENCOUNTER — Ambulatory Visit (HOSPITAL_COMMUNITY): Payer: PPO

## 2021-07-16 ENCOUNTER — Other Ambulatory Visit: Payer: Self-pay

## 2021-07-16 ENCOUNTER — Ambulatory Visit (HOSPITAL_COMMUNITY): Admission: RE | Admit: 2021-07-16 | Payer: PPO | Source: Home / Self Care | Admitting: Gastroenterology

## 2021-07-16 ENCOUNTER — Encounter (HOSPITAL_COMMUNITY): Payer: Self-pay | Admitting: Gastroenterology

## 2021-07-16 ENCOUNTER — Ambulatory Visit (HOSPITAL_COMMUNITY)
Admission: RE | Admit: 2021-07-16 | Discharge: 2021-07-16 | Disposition: A | Discharge status: Home / Self Care | Payer: PPO | Attending: Gastroenterology | Admitting: Gastroenterology

## 2021-07-16 DIAGNOSIS — Z6832 Body mass index (BMI) 32.0-32.9, adult: Secondary | ICD-10-CM | POA: Diagnosis not present

## 2021-07-16 DIAGNOSIS — Z87891 Personal history of nicotine dependence: Secondary | ICD-10-CM | POA: Insufficient documentation

## 2021-07-16 DIAGNOSIS — Z880 Allergy status to penicillin: Secondary | ICD-10-CM | POA: Insufficient documentation

## 2021-07-16 DIAGNOSIS — Z888 Allergy status to other drugs, medicaments and biological substances status: Secondary | ICD-10-CM | POA: Insufficient documentation

## 2021-07-16 DIAGNOSIS — Z887 Allergy status to serum and vaccine status: Secondary | ICD-10-CM | POA: Diagnosis not present

## 2021-07-16 DIAGNOSIS — E669 Obesity, unspecified: Secondary | ICD-10-CM | POA: Insufficient documentation

## 2021-07-16 DIAGNOSIS — Z794 Long term (current) use of insulin: Secondary | ICD-10-CM | POA: Insufficient documentation

## 2021-07-16 DIAGNOSIS — I119 Hypertensive heart disease without heart failure: Secondary | ICD-10-CM | POA: Diagnosis not present

## 2021-07-16 DIAGNOSIS — K8020 Calculus of gallbladder without cholecystitis without obstruction: Secondary | ICD-10-CM

## 2021-07-16 DIAGNOSIS — K8050 Calculus of bile duct without cholangitis or cholecystitis without obstruction: Secondary | ICD-10-CM | POA: Insufficient documentation

## 2021-07-16 DIAGNOSIS — E1142 Type 2 diabetes mellitus with diabetic polyneuropathy: Secondary | ICD-10-CM | POA: Insufficient documentation

## 2021-07-16 DIAGNOSIS — Z9889 Other specified postprocedural states: Secondary | ICD-10-CM | POA: Diagnosis not present

## 2021-07-16 HISTORY — PX: ENDOSCOPIC RETROGRADE CHOLANGIOPANCREATOGRAPHY (ERCP) WITH PROPOFOL: SHX5810

## 2021-07-16 HISTORY — PX: REMOVAL OF STONES: SHX5545

## 2021-07-16 HISTORY — PX: SPHINCTEROTOMY: SHX5279

## 2021-07-16 LAB — GLUCOSE, CAPILLARY: Glucose-Capillary: 150 mg/dL — ABNORMAL HIGH (ref 70–99)

## 2021-07-16 SURGERY — ENDOSCOPIC RETROGRADE CHOLANGIOPANCREATOGRAPHY (ERCP) WITH PROPOFOL
Anesthesia: General

## 2021-07-16 MED ORDER — PHENYLEPHRINE 40 MCG/ML (10ML) SYRINGE FOR IV PUSH (FOR BLOOD PRESSURE SUPPORT)
PREFILLED_SYRINGE | INTRAVENOUS | Status: DC | PRN
  Administered 2021-07-16: 80 ug via INTRAVENOUS
  Administered 2021-07-16 (×2): 100 ug via INTRAVENOUS

## 2021-07-16 MED ORDER — FENTANYL CITRATE (PF) 100 MCG/2ML IJ SOLN
INTRAMUSCULAR | Status: DC | PRN
  Administered 2021-07-16: 50 ug via INTRAVENOUS

## 2021-07-16 MED ORDER — GLUCAGON HCL RDNA (DIAGNOSTIC) 1 MG IJ SOLR
INTRAMUSCULAR | Status: AC
  Filled 2021-07-16: qty 1

## 2021-07-16 MED ORDER — SUGAMMADEX SODIUM 200 MG/2ML IV SOLN
INTRAVENOUS | Status: DC | PRN
  Administered 2021-07-16: 200 mg via INTRAVENOUS

## 2021-07-16 MED ORDER — SODIUM CHLORIDE 0.9 % IV SOLN: INTRAVENOUS | Status: DC

## 2021-07-16 MED ORDER — ROCURONIUM BROMIDE 10 MG/ML (PF) SYRINGE
PREFILLED_SYRINGE | INTRAVENOUS | Status: DC | PRN
  Administered 2021-07-16: 60 mg via INTRAVENOUS

## 2021-07-16 MED ORDER — LIDOCAINE HCL (CARDIAC) PF 100 MG/5ML IV SOSY
PREFILLED_SYRINGE | INTRAVENOUS | Status: DC | PRN
  Administered 2021-07-16: 60 mg via INTRAVENOUS

## 2021-07-16 MED ORDER — FENTANYL CITRATE (PF) 100 MCG/2ML IJ SOLN
INTRAMUSCULAR | Status: AC
  Filled 2021-07-16: qty 2

## 2021-07-16 MED ORDER — PHENYLEPHRINE HCL-NACL 20-0.9 MG/250ML-% IV SOLN
INTRAVENOUS | Status: DC | PRN
  Administered 2021-07-16: 50 ug/min via INTRAVENOUS

## 2021-07-16 MED ORDER — CIPROFLOXACIN IN D5W 400 MG/200ML IV SOLN
INTRAVENOUS | Status: AC
  Filled 2021-07-16: qty 200

## 2021-07-16 MED ORDER — PHENYLEPHRINE HCL (PRESSORS) 10 MG/ML IV SOLN
INTRAVENOUS | Status: AC
  Filled 2021-07-16: qty 2

## 2021-07-16 MED ORDER — LACTATED RINGERS IV SOLN: INTRAVENOUS | Status: DC | PRN

## 2021-07-16 MED ORDER — ONDANSETRON HCL 4 MG/2ML IJ SOLN
INTRAMUSCULAR | Status: DC | PRN
  Administered 2021-07-16: 4 mg via INTRAVENOUS

## 2021-07-16 MED ORDER — CIPROFLOXACIN IN D5W 400 MG/200ML IV SOLN
INTRAVENOUS | Status: DC | PRN
  Administered 2021-07-16: 400 mg via INTRAVENOUS

## 2021-07-16 MED ORDER — PROPOFOL 10 MG/ML IV BOLUS
INTRAVENOUS | Status: DC | PRN
  Administered 2021-07-16: 30 mg via INTRAVENOUS
  Administered 2021-07-16: 120 mg via INTRAVENOUS

## 2021-07-16 MED ORDER — INDOMETHACIN 50 MG RE SUPP
RECTAL | Status: AC
  Filled 2021-07-16: qty 2

## 2021-07-16 MED ORDER — INDOMETHACIN 50 MG RE SUPP
RECTAL | Status: DC | PRN
  Administered 2021-07-16: 100 mg via RECTAL

## 2021-07-16 MED ORDER — SODIUM CHLORIDE 0.9 % IV SOLN
INTRAVENOUS | Status: DC | PRN
  Administered 2021-07-16: 25 mL

## 2021-07-16 NOTE — Anesthesia Preprocedure Evaluation (Signed)
Anesthesia Evaluation  Patient identified by MRN, date of birth, ID band Patient awake    Reviewed: Allergy & Precautions, NPO status , Patient's Chart, lab work & pertinent test results, reviewed documented beta blocker date and time   Airway Mallampati: I  TM Distance: >3 FB Neck ROM: Full    Dental  (+) Teeth Intact, Dental Advisory Given, Caps, Missing   Pulmonary shortness of breath and with exertion, former smoker,    Pulmonary exam normal breath sounds clear to auscultation       Cardiovascular Exercise Tolerance: Good hypertension, Pt. on medications and Pt. on home beta blockers + CAD and + Past MI  Normal cardiovascular exam Rhythm:Regular Rate:Normal  Echo 05/15/14 Left ventricle: The cavity size was normal. Systolic function was mildly reduced. The estimated ejection fraction was in the range of 45% to 50%. Severe hypokinesis of the inferior myocardium. Left ventricular diastolic function parameters were normal.  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, asymptomatic since then, good exercise tolerance   Neuro/Psych Diabetic peripheral neuropathy  Neuromuscular disease negative psych ROS   GI/Hepatic Neg liver ROS, GERD  Medicated,Choledocholithiasis   Endo/Other  diabetes, Well Controlled, Type 2, Insulin DependentObesity Hyperlipidemia  Renal/GU negative Renal ROS  negative genitourinary   Musculoskeletal  (+) Arthritis , Osteoarthritis,    Abdominal (+) + obese,   Peds  Hematology negative hematology ROS (+)   Anesthesia Other Findings   Reproductive/Obstetrics                             Anesthesia Physical Anesthesia Plan  ASA: 3  Anesthesia Plan: General   Post-op Pain Management:    Induction: Intravenous  PONV Risk Score and Plan: 4 or greater and Treatment may vary due to age or medical condition and  Ondansetron  Airway Management Planned: Oral ETT  Additional Equipment:   Intra-op Plan:   Post-operative Plan: Extubation in OR  Informed Consent: I have reviewed the patients History and Physical, chart, labs and discussed the procedure including the risks, benefits and alternatives for the proposed anesthesia with the patient or authorized representative who has indicated his/her understanding and acceptance.     Dental advisory given  Plan Discussed with: CRNA and Anesthesiologist  Anesthesia Plan Comments:         Anesthesia Quick Evaluation

## 2021-07-16 NOTE — H&P (Signed)
  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 Were noted again on MR 2 months ago.  ROS: complete GI ROS as described in HPI, all other review negative.  Constitutional:  No unintentional weight loss   Past Medical History:  Diagnosis Date   Abnormal liver function    Arthritis    Atherosclerotic heart disease of native coronary artery without angina pectoris    Diabetes mellitus without complication (HCC)    GERD (gastroesophageal reflux disease)    Hyperlipemia    Hypertension    MI (myocardial infarction) ([place])    SCC (squamous cell carcinoma), arm, left    SCC (squamous cell carcinoma), arm, 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    Past Surgical History:  Procedure Laterality Date   ANKLE SURGERY  93,03   rt and lt    CHOLECYSTECTOMY  10/19/2012   Procedure: LAPAROSCOPIC CHOLECYSTECTOMY WITH INTRAOPERATIVE CHOLANGIOGRAM;  Surgeon: Ralene Ok, MD;  Location: [place];  Service: General;  Laterality: N/A;   [place]  12/2020   Squamous Cell Cancer   TIBIA FRACTURE SURGERY      No current facility-administered medications for this encounter.    Allergies as of 06/12/2021 - Review Complete 06/11/2021  Allergen Reaction Noted   Nitroglycerin Other (See Comments)    Statins Other (See Comments) 06/22/2020   Tetanus toxoid Other (See Comments)    Penicillins Rash     History reviewed. No pertinent family history.  Social History   Socioeconomic History   Marital status: Married    Spouse name: Not on file   Number of children: Not on file   Years of education: Not on file   Highest education level: Not on file  Occupational History   Not on file  Tobacco Use   Smoking status: Former    Types: Cigarettes    Quit date: 10/19/1975    Years since quitting: 45.7   Smokeless tobacco: Never  Substance  and Sexual Activity   Alcohol use: No   Drug use: No   Sexual activity: Not on file  Other Topics Concern   Not on file  Social History Narrative   Not on file   Social Determinants of Health   Financial Resource Strain: Not on file  Food Insecurity: Not on file  Transportation Needs: Not on file  Physical Activity: Not on file  Stress: Not on file  Social Connections: Not on file  Intimate Partner Violence: Not on file     Physical Exam: There were no vitals taken for this visit. 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  ERCP today.  Please see the "Patient Instructions" section for addition details about the plan.  Owens Loffler, MD [facility] 07/16/2021, 7:23 AM

## 2021-07-16 NOTE — Anesthesia Postprocedure Evaluation (Signed)
Anesthesia Post Note  Patient: Andre Shaffer  Procedure(s) Performed: ENDOSCOPIC RETROGRADE CHOLANGIOPANCREATOGRAPHY (ERCP) WITH PROPOFOL SPHINCTEROTOMY REMOVAL OF STONES     Patient location during evaluation: PACU Anesthesia Type: General Level of consciousness: awake and alert and oriented Pain management: pain level controlled Vital Signs Assessment: post-procedure vital signs reviewed and stable Respiratory status: spontaneous breathing, nonlabored ventilation and respiratory function stable Cardiovascular status: blood pressure returned to baseline and stable Postop Assessment: no apparent nausea or vomiting Anesthetic complications: no   No notable events documented.  Last Vitals:  Vitals:   07/16/21 0958 07/16/21 1000  BP:  124/66  Pulse: 69 69  Resp: 10 11  Temp:    SpO2: 98% 95%    Last Pain:  Vitals:   07/16/21 0948  TempSrc: Oral  PainSc: 0-No pain                 Cynde Menard A.

## 2021-07-16 NOTE — Transfer of Care (Signed)
Immediate Anesthesia Transfer of Care Note  Patient: Andre Shaffer  Procedure(s) Performed: ENDOSCOPIC RETROGRADE CHOLANGIOPANCREATOGRAPHY (ERCP) WITH PROPOFOL SPHINCTEROTOMY REMOVAL OF STONES  Patient Location: Endoscopy Unit  Anesthesia Type:General  Level of Consciousness: awake, drowsy and patient cooperative  Airway & Oxygen Therapy: Patient Spontanous Breathing and Patient connected to face mask oxygen  Post-op Assessment: Report given to RN and Post -op Vital signs reviewed and stable  Post vital signs: Reviewed and stable  Last Vitals:  Vitals Value Taken Time  BP 165/84 0945  Temp    Pulse 69 0948  Resp    SpO2 100 0948    Last Pain:  Vitals:   07/16/21 0754  TempSrc: Oral  PainSc: 0-No pain         Complications: No notable events documented.

## 2021-07-16 NOTE — Anesthesia Procedure Notes (Signed)
Procedure Name: Intubation Date/Time: 07/16/2021 8:34 AM Performed by: Claudia Desanctis, CRNA Pre-anesthesia Checklist: Patient identified, Emergency Drugs available, Suction available and Patient being monitored Patient Re-evaluated:Patient Re-evaluated prior to induction Oxygen Delivery Method: Circle system utilized Preoxygenation: Pre-oxygenation with 100% oxygen Induction Type: IV induction Ventilation: Mask ventilation without difficulty Laryngoscope Size: 2 and Miller Grade View: Grade I Tube type: Oral Tube size: 7.5 mm Number of attempts: 1 Airway Equipment and Method: Stylet Placement Confirmation: ETT inserted through vocal cords under direct vision, positive ETCO2 and breath sounds checked- equal and bilateral Secured at: 22 cm Tube secured with: Tape Dental Injury: Teeth and Oropharynx as per pre-operative assessment

## 2021-07-16 NOTE — Op Note (Signed)
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 Providers:                Milus Banister, MD, Doristine Johns, RN, Laverda Sorenson, Technician, Hedy Camara Referring MD:              Medicines:                General Anesthesia, Indomethacin 100 mg PR, Cipro                            [id number] mg IV Complications:            No immediate complications. Estimated blood loss:                            None Estimated Blood Loss:     Estimated blood loss: none. Procedure:                Pre-Anesthesia Assessment:                           - Prior to the procedure, a History and Physical                            was performed, and patient medications and                            allergies were reviewed. The patient's tolerance of                            previous anesthesia was also reviewed. The risks                            and benefits of the procedure and the sedation  options and risks were discussed with the patient.                            All questions were answered, and informed consent                            was obtained. Prior Anticoagulants: The patient has                            taken no previous anticoagulant or antiplatelet                            agents. ASA Grade Assessment: II - A patient with                            mild systemic disease. After reviewing the risks                             and benefits, the patient was deemed in                            satisfactory condition to undergo the procedure.                           After obtaining informed consent, the scope was                            passed under direct vision. Throughout the                            procedure, the patient's blood pressure, pulse, and                            oxygen saturations were monitored continuously. The                            [id number]-Q180V [id number]) Olympus duodenoscope was                            introduced through the mouth, and used to inject                            contrast into and used to inject contrast into the                            bile duct. The ERCP was accomplished without                            difficulty. The patient tolerated the procedure                            well. Scope In: Scope Out: Findings:      Scout film showed surgical clips in the right upper quadrant. The  duodenoscope was advanced to the region of the major papilla without       detailed examination of the upper GI tract. The major papilla was       normal. I first used a 44 autotome over a 0.035 Hydra wire to attempt       biliary cannulation without quick success. The wire was left in place in       the main pancreatic duct to facilitate biliary cannulation after that. I       used a Jag tome revolution Rx over a 0.025 wire to cannulate the bile       duct. Once position was secured in the bile duct, confirmed with       cholangiogram, I removed the pancreatic duct wire. Further cholangiogram       images showed the CBD was slightly dilated at about 7 mm. There was at       least 1 very clear small to medium sized mobile filling defect. The       intrahepatic duct on the left was slightly dilated. No strictures were       found. I then performed an adequate biliary sphincterotomy over the wire       and proceeded to sweep the bile duct several times  with a 9 to 12 mm       biliary retrieval balloon. This yielded 2 discrete medium sized       black-colored stones as well as several smaller stone fragments.       Completion occlusion cholangiogram showed no obvious remaining filling       defects. There was no purulence and no bleeding following the       sphincterotomy. Impression:               - Choledocholithiasis, treated today with biliary                            sphincterotomy and balloon sweeping of the bile duct Moderate Sedation:      Not Applicable - Patient had care per Anesthesia. Recommendation:           - Discharge patient to home.                           - Follow up as needed with Dr. Melina Copa ([facility]) Procedure Code(s):        --- Professional ---                           [id number], Endoscopic retrograde                            cholangiopancreatography (ERCP); with                            sphincterotomy/papillotomy Diagnosis Code(s):        --- Professional ---                           K80.50, Calculus of bile duct without cholangitis                            or cholecystitis without obstruction CPT copyright 2019 [employer].  All rights reserved. The codes documented in this report are preliminary and upon coder review may  be revised to meet current compliance requirements. Milus Banister, MD 07/16/2021 9:44:03 AM This report has been signed electronically. Number of Addenda: 0

## 2021-07-16 NOTE — Discharge Instructions (Signed)
YOU HAD AN ENDOSCOPIC PROCEDURE TODAY: Refer to the procedure report and other information in the discharge instructions given to you for any specific questions about what was found during the examination. If this information does not answer your questions, please call [place] office at [phone] to clarify.   YOU SHOULD EXPECT: Some feelings of bloating in the abdomen. Passage of more gas than usual. Walking can help get rid of the air that was put into your GI tract during the procedure and reduce the bloating. If you had a lower endoscopy (such as a colonoscopy or flexible sigmoidoscopy) you may notice spotting of blood in your stool or on the toilet paper. Some abdominal soreness may be present for a day or two, also.  DIET: Your first meal following the procedure should be a light meal and then it is ok to progress to your normal diet. A half-sandwich or bowl of soup is an example of a good first meal. Heavy or fried foods are harder to digest and may make you feel nauseous or bloated. Drink plenty of fluids but you should avoid alcoholic beverages for 24 hours. If you had a esophageal dilation, please see attached instructions for diet.    ACTIVITY: Your care partner should take you home directly after the procedure. You should plan to take it easy, moving slowly for the rest of the day. You can resume normal activity the day after the procedure however YOU SHOULD NOT DRIVE, use power tools, machinery or perform tasks that involve climbing or major physical exertion for 24 hours (because of the sedation medicines used during the test).   SYMPTOMS TO REPORT IMMEDIATELY: A gastroenterologist can be reached at any hour. Please call [phone]  for any of the following symptoms:   Following upper endoscopy (EGD, EUS, ERCP, esophageal dilation) Vomiting of blood or coffee ground material  New, significant abdominal pain  New, significant chest pain or pain under the shoulder blades  Painful or  persistently difficult swallowing  New shortness of breath  Black, tarry-looking or red, bloody stools  FOLLOW UP:  If any biopsies were taken you will be contacted by phone or by letter within the next 1-3 weeks. Call [phone]  if you have not heard about the biopsies in 3 weeks.  Please also call with any specific questions about appointments or follow up tests.  

## 2021-07-17 ENCOUNTER — Encounter (HOSPITAL_COMMUNITY): Payer: Self-pay | Admitting: Gastroenterology

## 2021-07-23 ENCOUNTER — Other Ambulatory Visit: Payer: Self-pay | Admitting: Family Medicine

## 2021-07-28 ENCOUNTER — Ambulatory Visit: Payer: PPO | Admitting: Family Medicine

## 2021-08-10 ENCOUNTER — Encounter: Payer: Self-pay | Admitting: Family Medicine

## 2021-08-10 ENCOUNTER — Ambulatory Visit (INDEPENDENT_AMBULATORY_CARE_PROVIDER_SITE_OTHER): Payer: PPO | Admitting: Family Medicine

## 2021-08-10 ENCOUNTER — Other Ambulatory Visit: Payer: Self-pay

## 2021-08-10 VITALS — BP 132/78 | HR 70 | Temp 97.8°F | Ht 74.0 in | Wt 256.0 lb

## 2021-08-10 DIAGNOSIS — K1379 Other lesions of oral mucosa: Secondary | ICD-10-CM | POA: Diagnosis not present

## 2021-08-10 DIAGNOSIS — K8050 Calculus of bile duct without cholangitis or cholecystitis without obstruction: Secondary | ICD-10-CM | POA: Diagnosis not present

## 2021-08-10 DIAGNOSIS — Z23 Encounter for immunization: Secondary | ICD-10-CM

## 2021-08-10 NOTE — Progress Notes (Signed)
Acute Office Visit  Subjective:    Patient ID: Andre Shaffer, male    DOB: 1941/10/16, 80 y.o.   MRN: [id number]  Chief Complaint  Patient presents with   Swollen place on jaw and gum   HPI Patient is in today for sore jaw and gum x 2 weeks. No clicking. Inside jaw hurts. Outside jaw below neck had hurt, but is better.   ERCP reviewed. Removed several stones.  No more symptoms. Has not had labs redrawn. Due for diabetes visit.   Past Medical History:  Diagnosis Date   Abnormal liver function    Arthritis    Atherosclerotic heart disease of native coronary artery without angina pectoris    Diabetes mellitus without complication (HCC)    GERD (gastroesophageal reflux disease)    Hyperlipemia    Hypertension    MI (myocardial infarction) ([place])    SCC (squamous cell carcinoma), arm, left    SCC (squamous cell carcinoma), arm, 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    Past Surgical History:  Procedure Laterality Date   ANKLE SURGERY  93,03   rt and lt    CHOLECYSTECTOMY  10/19/2012   Procedure: LAPAROSCOPIC CHOLECYSTECTOMY WITH INTRAOPERATIVE CHOLANGIOGRAM;  Surgeon: Ralene Ok, MD;  Location: [place];  Service: General;  Laterality: N/A;   COLONOSCOPY     CORONARY ARTERY BYPASS GRAFT  1997   ENDOSCOPIC RETROGRADE CHOLANGIOPANCREATOGRAPHY (ERCP) WITH PROPOFOL N/A 07/16/2021   Procedure: ENDOSCOPIC RETROGRADE CHOLANGIOPANCREATOGRAPHY (ERCP) WITH PROPOFOL;  Surgeon: Milus Banister, MD;  Location: [facility];  Service: Endoscopy;  Laterality: N/A;   REMOVAL OF STONES  07/16/2021   Procedure: REMOVAL OF STONES;  Surgeon: Milus Banister, MD;  Location: [facility];  Service: Endoscopy;;   SKIN CANCER EXCISION  12/2020   Squamous Cell Cancer   SPHINCTEROTOMY  07/16/2021   Procedure: SPHINCTEROTOMY;  Surgeon: Milus Banister, MD;  Location: [facility];  Service: Endoscopy;;   TIBIA FRACTURE SURGERY      History reviewed. No pertinent family history.  Social History    Socioeconomic History   Marital status: Married    Spouse name: Not on file   Number of children: Not on file   Years of education: Not on file   Highest education level: Not on file  Occupational History   Not on file  Tobacco Use   Smoking status: Former    Types: Cigarettes    Quit date: 10/19/1975    Years since quitting: 45.8   Smokeless tobacco: Never  Substance and Sexual Activity   Alcohol use: No   Drug use: No   Sexual activity: Not on file  Other Topics Concern   Not on file  Social History Narrative   Not on file   Social Determinants of Health   Financial Resource Strain: Not on file  Food Insecurity: Not on file  Transportation Needs: Not on file  Physical Activity: Not on file  Stress: Not on file  Social Connections: Not on file  Intimate Partner Violence: Not on file    Outpatient Medications Prior to Visit  Medication Sig Dispense Refill   aspirin EC 325 MG tablet Take 325 mg by mouth daily.     Cyanocobalamin 1000 MCG/ML LIQD Take 2,500 mcg by mouth See admin instructions. Place 0.5 dropperful (2,500 mcg) sublingually daily     dapagliflozin propanediol (FARXIGA) 10 MG TABS tablet Take 1 tablet (10 mg total) by mouth daily before breakfast. 28 tablet 0   ezetimibe (ZETIA) 10 MG tablet  TAKE ONE (1) TABLET BY MOUTH ONCE DAILY (Patient taking differently: Take 10 mg by mouth every evening.) 90 tablet 1   fluticasone (FLONASE) 50 MCG/ACT nasal spray Place 2 sprays into both nostrils daily. 16 g 6   FREESTYLE LITE test strip USE TO CHECK BLOOD SUGAR TWICE DAILY 200 each 2   gabapentin (NEURONTIN) 300 MG capsule TAKE ONE CAPSULE BY MOUTH TWICE A DAY (Patient taking differently: Take 300 mg by mouth See admin instructions. Take 300 mg daily, may take a second 300 mg dose as needed for pain) 180 capsule 3   ibuprofen (ADVIL,MOTRIN) 200 MG tablet Take 200 mg by mouth every 8 (eight) hours as needed for moderate pain or mild pain.     insulin aspart protamine-  aspart (NOVOLOG MIX 70/30) (70-30) 100 UNIT/ML injection Inject 40-60 Units into the skin See admin instructions. Inject 60 units into the skin before breakfast and 40 units units before supper/evening meal     metFORMIN (GLUCOPHAGE) 1000 MG tablet TAKE ONE TABLET TWICE DAILY 180 tablet 1   metoprolol succinate (TOPROL-XL) 25 MG 24 hr tablet TAKE ONE (1) TABLET ONCE DAILY 90 tablet 1   telmisartan (MICARDIS) 80 MG tablet TAKE ONE (1) TABLET BY MOUTH ONCE DAILY 90 tablet 1   No facility-administered medications prior to visit.    Allergies  Allergen Reactions   Nitroglycerin Other (See Comments)    Blood pressure 'bottomed out'   Statins Other (See Comments)    Elevation of liver enzymes   Tetanus Toxoid Other (See Comments)    Severe flu symptoms fever and chills   Penicillins Rash    Review of Systems  Constitutional:  Negative for appetite change, fatigue and fever.  HENT:  Positive for mouth sores (Left lower inside jaw area). Negative for congestion, ear pain and sore throat.   Respiratory:  Negative for cough and shortness of breath.   Cardiovascular:  Negative for chest pain and leg swelling.  Gastrointestinal:  Negative for abdominal pain, constipation, diarrhea, nausea and vomiting.  Genitourinary:  Negative for dysuria and frequency.  Musculoskeletal:  Negative for arthralgias and myalgias.  Neurological:  Negative for dizziness and headaches.  Psychiatric/Behavioral:  Negative for dysphoric mood. The patient is not nervous/anxious.       Objective:    Physical Exam Vitals reviewed.  Constitutional:      Appearance: Normal appearance. He is obese.  HENT:     Nose: Nose normal. No congestion or rhinorrhea.     Mouth/Throat:     Mouth: Mucous membranes are moist.     Pharynx: No oropharyngeal exudate or posterior oropharyngeal erythema.     Comments: No sores around teeth or in gums.  Cardiovascular:     Rate and Rhythm: Normal rate and regular rhythm.     Heart  sounds: Normal heart sounds.  Pulmonary:     Effort: Pulmonary effort is normal. No respiratory distress.     Breath sounds: Normal breath sounds. No wheezing, rhonchi or rales.  Abdominal:     Tenderness: There is no abdominal tenderness.  Lymphadenopathy:     Cervical: No cervical adenopathy.  Neurological:     Mental Status: He is alert.  Psychiatric:        Mood and Affect: Mood normal.        Behavior: Behavior normal.    BP 132/78 (BP Location: Right Arm, Patient Position: Sitting)   Pulse 70   Temp 97.8 F (36.6 C) (Temporal)   Ht '6\' 2"'  (  1.88 m)   Wt 256 lb (116.1 kg)   SpO2 98%   BMI 32.87 kg/m  Wt Readings from Last 3 Encounters:  08/10/21 256 lb (116.1 kg)  07/16/21 250 lb (113.4 kg)  05/12/21 252 lb (114.3 kg)    Health Maintenance Due  Topic Date Due   OPHTHALMOLOGY EXAM  Never done   TETANUS/TDAP  Never done   Zoster Vaccines- Shingrix (1 of 2) Never done   COVID-19 Vaccine (4 - Booster for Pfizer series) 01/08/2021    There are no preventive care reminders to display for this patient.   Lab Results  Component Value Date   TSH 3.590 10/06/2020   Lab Results  Component Value Date   WBC 5.6 05/01/2021   HGB 14.9 05/01/2021   HCT 44.8 05/01/2021   MCV 97.4 05/01/2021   PLT 187 05/01/2021   Lab Results  Component Value Date   NA 134 (L) 05/01/2021   K 4.8 05/01/2021   CO2 22 05/01/2021   GLUCOSE 420 (H) 05/01/2021   BUN 22 05/01/2021   CREATININE 1.31 (H) 05/01/2021   BILITOT 1.3 (H) 05/01/2021   ALKPHOS 252 (H) 05/01/2021   AST 48 (H) 05/01/2021   ALT 74 (H) 05/01/2021   PROT 7.3 05/01/2021   ALBUMIN 3.4 (L) 05/01/2021   CALCIUM 9.7 05/01/2021   ANIONGAP 10 05/01/2021   EGFR 68 04/16/2021   Lab Results  Component Value Date   CHOL 140 04/16/2021   Lab Results  Component Value Date   HDL 26 (L) 04/16/2021   Lab Results  Component Value Date   LDLCALC 89 04/16/2021   Lab Results  Component Value Date   TRIG 139 04/16/2021    Lab Results  Component Value Date   CHOLHDL 5.4 (H) 04/16/2021   Lab Results  Component Value Date   HGBA1C 7.9 (H) 04/16/2021         Assessment & Plan:   Problem List Items Addressed This Visit       Digestive   Intrahepatic bile duct stones    Resolved with treatment of ERCP.  Check LFTs in 3 weeks.  No symptoms.        Other   Sore mouth - Primary    Warm salt water swish and spit. May use listerine to swish and spit. If persists, recommend call dentist.       Other Visit Diagnoses     Need for immunization against influenza       Relevant Orders   Flu Vaccine QUAD High Dose(Fluad) (Completed)      I,Lauren M Auman,acting as a scribe for Rochel Brome, MD.,have documented all relevant documentation on the behalf of Rochel Brome, MD,as directed by  Rochel Brome, MD while in the presence of Rochel Brome, MD.   Follow up in 3-4 weeks fasting.   Rochel Brome, MD

## 2021-08-10 NOTE — Assessment & Plan Note (Signed)
Warm salt water swish and spit. May use listerine to swish and spit. If persists, recommend call dentist.

## 2021-08-10 NOTE — Assessment & Plan Note (Signed)
Resolved with treatment of ERCP.  Check LFTs in 3 weeks.  No symptoms.

## 2021-08-11 ENCOUNTER — Ambulatory Visit: Payer: PPO | Admitting: Family Medicine

## 2021-08-13 IMAGING — MR MR ABDOMEN WO/W CM MRCP
14 of 19 series · 33 of 48 positions shown · IV contrast (20 ml multihance)
Comparison: CT abdomen pelvis, 04/23/2021

CLINICAL DATA: Abdominal pain, biliary obstruction suspected,
elevated bilirubin

EXAM:
MRI ABDOMEN WITHOUT AND WITH CONTRAST (INCLUDING MRCP)
TECHNIQUE: Multiplanar multisequence MR imaging of the abdomen was performed
both before and after the administration of intravenous contrast.
Heavily T2-weighted images of the biliary and pancreatic ducts were
obtained, and three-dimensional MRCP images were rendered by post
processing.
CONTRAST:  20mL MULTIHANCE GADOBENATE DIMEGLUMINE 529 MG/ML IV SOLN

[Series 4: T2 · coronal · 5.0mm · 1.56mm/px · 1 of 35 slices shown (1 of 3)]
[im 1/35]
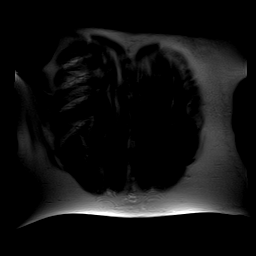

[Series 5: T2 · axial · 6.0mm · 1.48mm/px · 1 of 36 slices shown (2 of 3)]
[im 1/36]
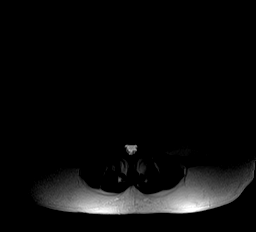

[Series 8: T2 · axial · 6.0mm · 0.78mm/px · z∈[-171,+98]mm · 2 of 40 slices shown (3 of 3)]
[im 1/40]
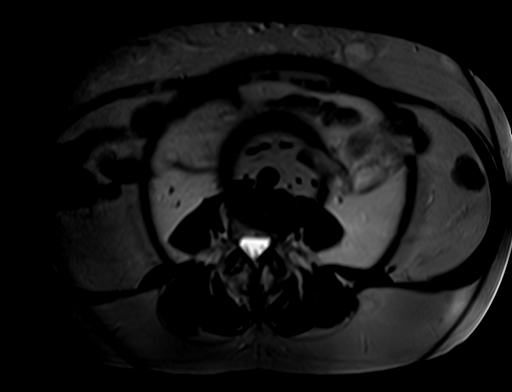
[im 40/40]
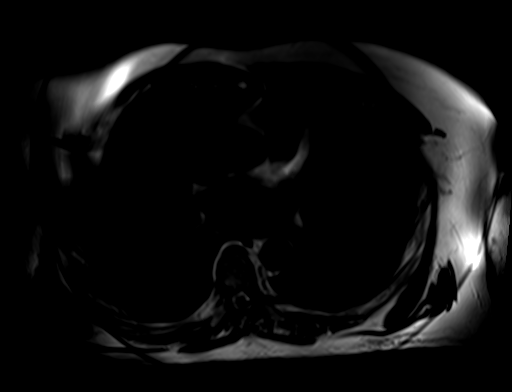

[Series 11: ep2d_diff_b50_500_800_p2-resp · 1 of 3 slices shown]
[im 1/3]
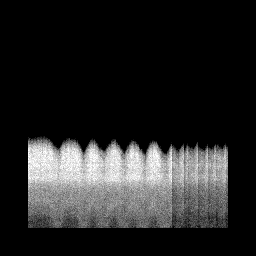

[Series 12: ep2d_diff_b50_500_800_p2 · axial · 6.0mm · 2.08mm/px · z∈[-179,+90]mm · 5 of 119 slices shown]
[im 1/119]
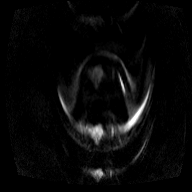
[im 30/119]
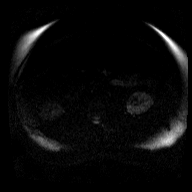
[im 60/119]
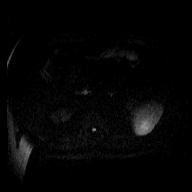
[im 89/119]
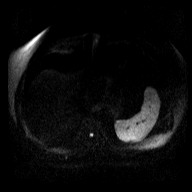
[im 119/119]
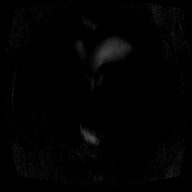

[Series 13: ep2d_diff_b50_500_800_p2_adc · axial · 6.0mm · 2.08mm/px · z∈[-179,+90]mm · 2 of 40 slices shown]
[im 1/40]
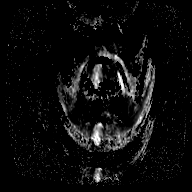
[im 40/40]
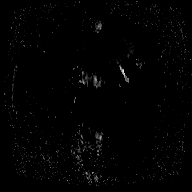

[Series 14: axial tru fisp · axial · 5.0mm · 1.56mm/px · z∈[-182,+70]mm · 2 of 43 slices shown]
[im 1/43]
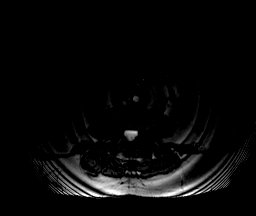
[im 43/43]
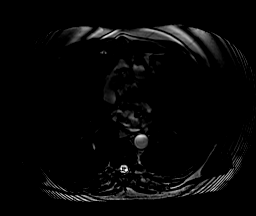

[Series 15: axial in out · axial · 6.0mm · 0.78mm/px · z∈[-174,+61]mm · 3 of 70 slices shown]
[im 1/70]
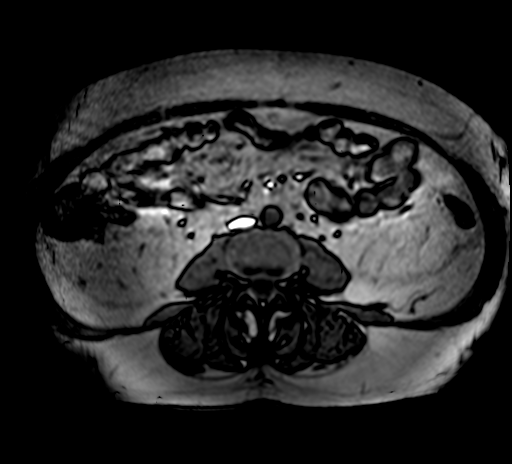
[im 35/70]
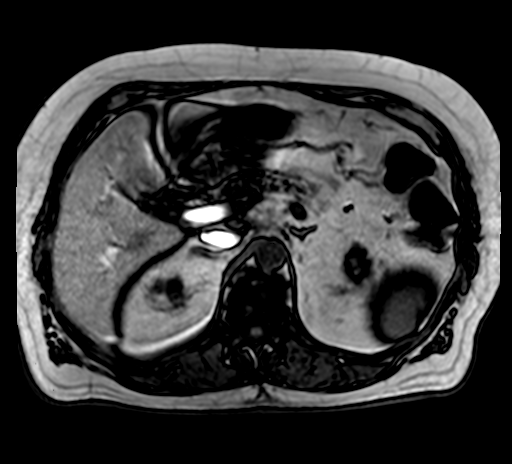
[im 70/70]
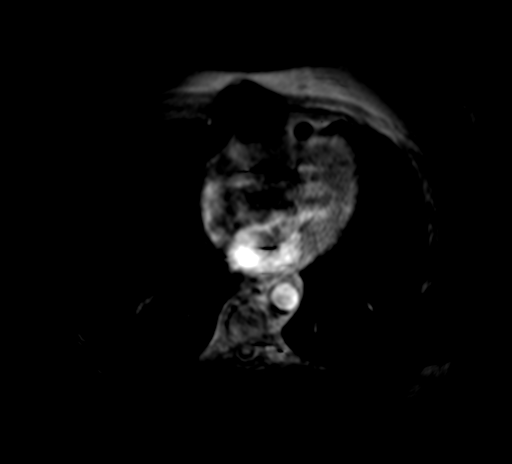

[Series 16: T1 dynamic · axial · non-contrast · 2.5mm · 0.70mm/px · z∈[-155,+42]mm · 3 of 80 slices shown]
[im 1/80]
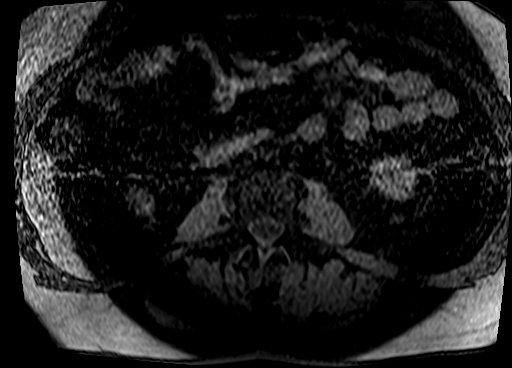
[im 40/80]
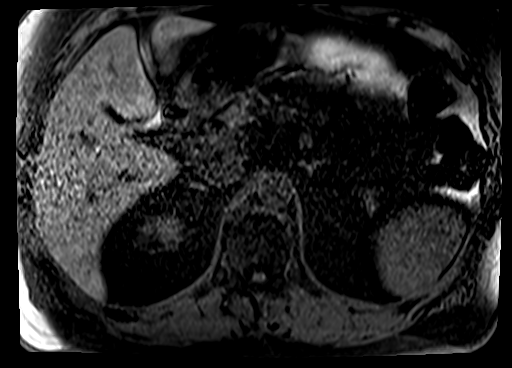
[im 80/80]
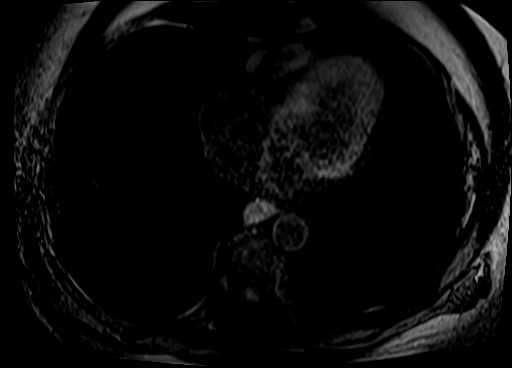

[Series 17: post 25 sec · axial · 2.5mm · 0.70mm/px · z∈[-155,+42]mm · 3 of 80 slices shown]
[im 1/80]
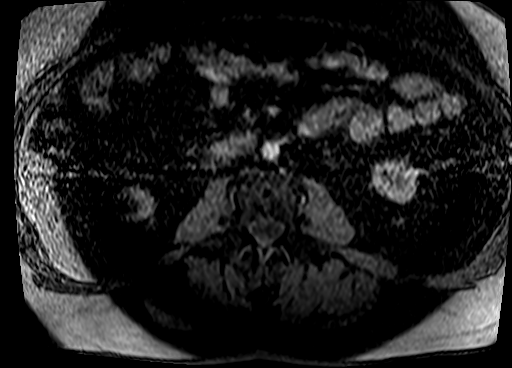
[im 40/80]
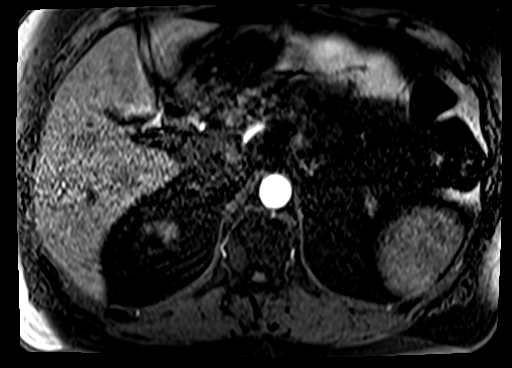
[im 80/80]
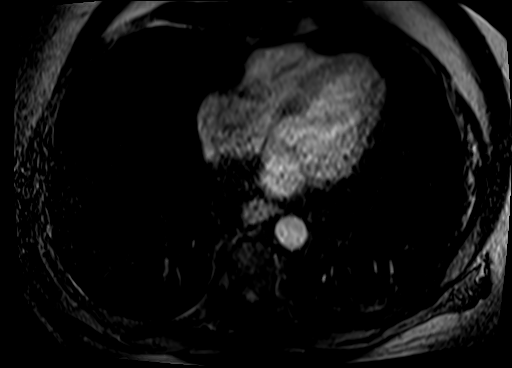

[Series 18: post 25 sec_sub · axial · 2.5mm · 0.70mm/px · z∈[-155,+42]mm · 3 of 80 slices shown]
[im 1/80]
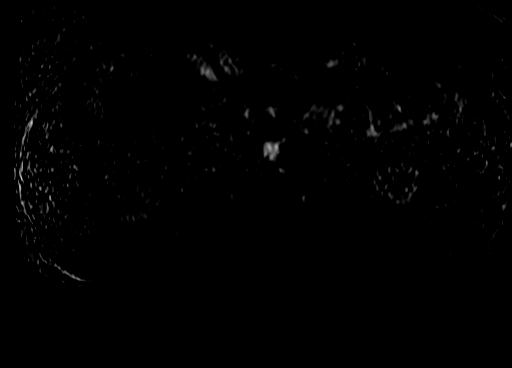
[im 40/80]
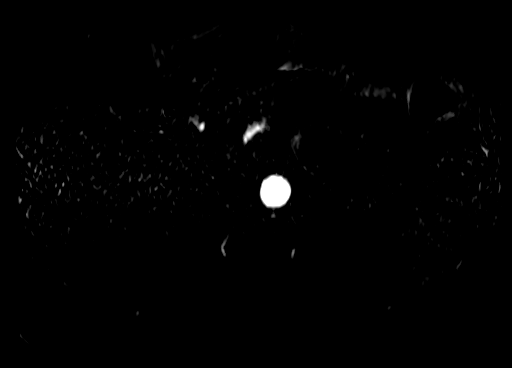
[im 80/80]
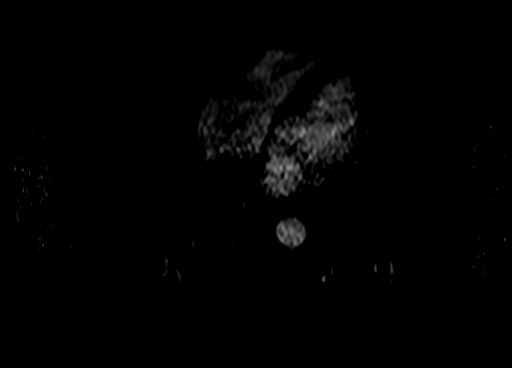

[Series 19: post 45 sec · axial · 2.5mm · 0.70mm/px · z∈[-155,+42]mm · 3 of 80 slices shown]
[im 1/80]
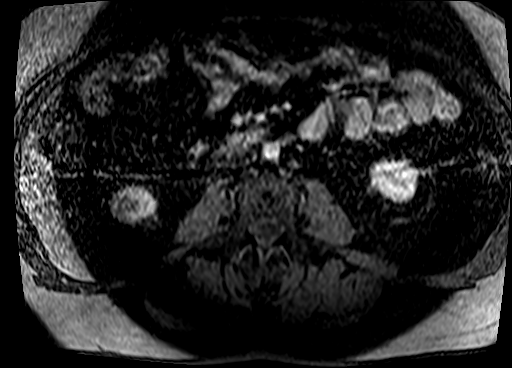
[im 40/80]
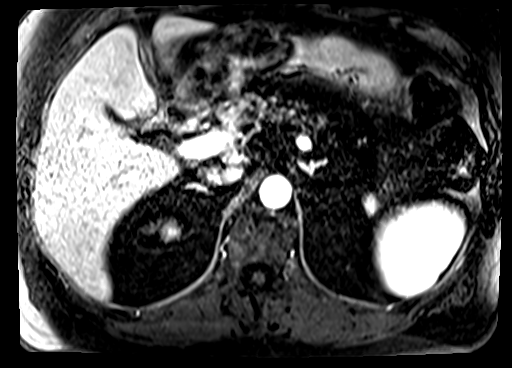
[im 80/80]
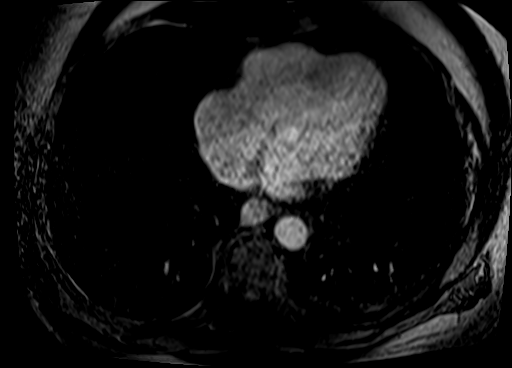

[Series 20: post 45 sec_sub · axial · 2.5mm · 0.70mm/px · z∈[-155,+42]mm · 3 of 80 slices shown]
[im 1/80]
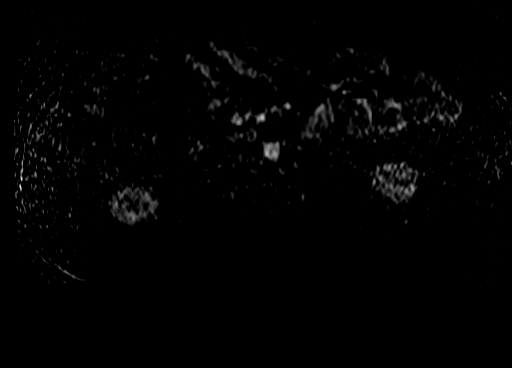
[im 40/80]
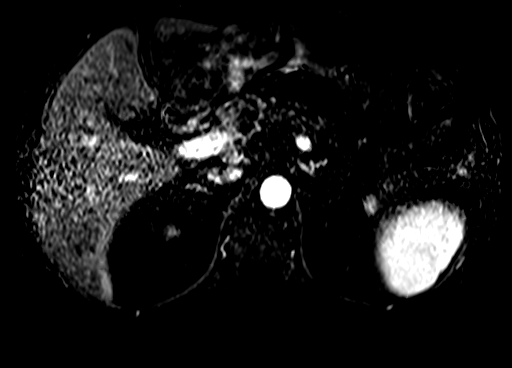
[im 80/80]
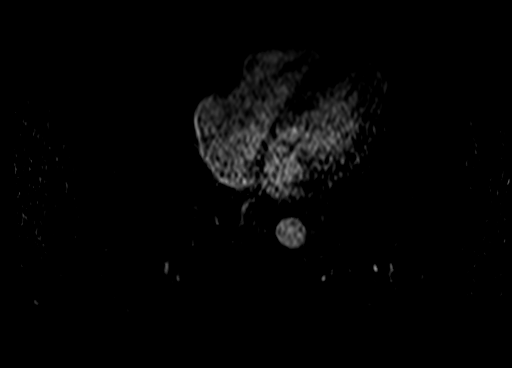

[Series 21: post 90 sec · axial · 2.5mm · 0.70mm/px · 1 of 80 slices shown]
[im 1/80]
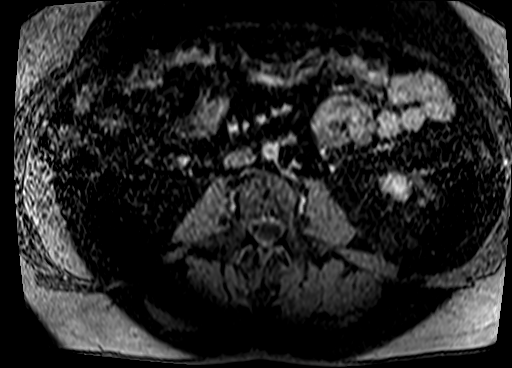

[33 of 48 positions shown; findings below may reference images not displayed]

FINDINGS: Lower chest: No acute findings.

Hepatobiliary: No mass or other parenchymal abnormality identified.
Status post cholecystectomy. No biliary ductal dilatation. There are
three small adjacent gallstones in the central common bile duct
measuring no greater than 3-4 mm (series 5, image 23, series 9,
image 26).

Pancreas: No mass, inflammatory changes, or other parenchymal
abnormality identified. No pancreatic ductal dilatation.

Spleen:  Within normal limits in size and appearance.

Adrenals/Urinary Tract: No masses identified. Left parapelvic cyst.
No evidence of hydronephrosis.

Stomach/Bowel: Visualized portions within the abdomen are
unremarkable.

Vascular/Lymphatic: No pathologically enlarged lymph nodes
identified. No abdominal aortic aneurysm demonstrated. Aortic
atherosclerosis.

Other:  None.

Musculoskeletal: No suspicious bone lesions identified.
IMPRESSION: 1. There are three small adjacent gallstones in the central common
bile duct measuring no greater than 3-4 mm. No biliary ductal
dilatation.
2. Status post cholecystectomy.

These results will be called to the ordering clinician or
representative by the Radiologist Assistant, and communication
documented in the PACS or [REDACTED].

Aortic Atherosclerosis ([id number]).

## 2021-09-09 NOTE — Progress Notes (Signed)
Subjective:  Patient ID: Andre Shaffer, male    DOB: Apr 30, 1941  Age: 80 y.o. MRN: [id number]  Chief Complaint  Patient presents with   Diabetes    HPI Patient is an 80 year old white male with past medical history significant for diabetes, hypertension, hyperlipidemia, coronary artery disease, and most recently intrahepatic cholelithiasis.  Patient underwent ERCP and has not had issues since then.  This happened in approximately September.  Diabetes: Sugars 80-200.  Checks sugars daily.  Has low sugars approximately once a month.  Checks his feet daily.  Annual eye exam was August 09, 2021.  He recently saw the eye doctor at the [place] who said he is a wrinkle in his right eye and is monitoring it closely.  He was told to call them back if he had any significant changes in his eyesight.  Diabetes medicines include farxiga 10 mg once daily gabapentin 300 mg twice daily for neuropathy, NovoLog Mix 70/30 16 units in the skin before breakfast and 40 units before supper.  Also on metformin 1000 mg twice daily.  Hypertensive heart disease: Currently on aspirin 325 mg once daily, Toprol-XL 25 mg once daily, Micardis 80 mg once daily.  Patient is currently on Zetia 10 mg once daily for his cholesterol.  Previously was on statins but he had intermittently elevated liver function test.  This may be something that can be restarted since his ERCP.  Current Outpatient Medications on File Prior to Visit  Medication Sig Dispense Refill   aspirin EC 325 MG tablet Take 325 mg by mouth daily.     Cyanocobalamin 1000 MCG/ML LIQD Take 2,500 mcg by mouth See admin instructions. Place 0.5 dropperful (2,500 mcg) sublingually daily     dapagliflozin propanediol (FARXIGA) 10 MG TABS tablet Take 1 tablet (10 mg total) by mouth daily before breakfast. 28 tablet 0   ezetimibe (ZETIA) 10 MG tablet TAKE ONE (1) TABLET BY MOUTH ONCE DAILY (Patient taking differently: Take 10 mg by mouth every evening.) 90 tablet 1    fluticasone (FLONASE) 50 MCG/ACT nasal spray Place 2 sprays into both nostrils daily. 16 g 6   FREESTYLE LITE test strip USE TO CHECK BLOOD SUGAR TWICE DAILY 200 each 2   gabapentin (NEURONTIN) 300 MG capsule TAKE ONE CAPSULE BY MOUTH TWICE A DAY (Patient taking differently: Take 300 mg by mouth See admin instructions. Take 300 mg daily, may take a second 300 mg dose as needed for pain) 180 capsule 3   ibuprofen (ADVIL,MOTRIN) 200 MG tablet Take 200 mg by mouth every 8 (eight) hours as needed for moderate pain or mild pain.     insulin aspart protamine- aspart (NOVOLOG MIX 70/30) (70-30) 100 UNIT/ML injection Inject 40-60 Units into the skin See admin instructions. Inject 60 units into the skin before breakfast and 40 units units before supper/evening meal     metFORMIN (GLUCOPHAGE) 1000 MG tablet TAKE ONE TABLET TWICE DAILY 180 tablet 1   metoprolol succinate (TOPROL-XL) 25 MG 24 hr tablet TAKE ONE (1) TABLET ONCE DAILY 90 tablet 1   telmisartan (MICARDIS) 80 MG tablet TAKE ONE (1) TABLET BY MOUTH ONCE DAILY 90 tablet 1   No current facility-administered medications on file prior to visit.   Past Medical History:  Diagnosis Date   Abnormal liver function    Arthritis    Atherosclerotic heart disease of native coronary artery without angina pectoris    Diabetes mellitus without complication (HCC)    GERD (gastroesophageal reflux disease)  Hyperlipemia    Hypertension    MI (myocardial infarction) ([place])    SCC (squamous cell carcinoma), arm, left    SCC (squamous cell carcinoma), arm, 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   Past Surgical History:  Procedure Laterality Date   ANKLE SURGERY  93,03   rt and lt    CHOLECYSTECTOMY  10/19/2012   Procedure: LAPAROSCOPIC CHOLECYSTECTOMY WITH INTRAOPERATIVE CHOLANGIOGRAM;  Surgeon: Ralene Ok, MD;  Location: [facility];  Service: General;  Laterality: N/A;   COLONOSCOPY     CORONARY ARTERY BYPASS GRAFT  1997   ENDOSCOPIC RETROGRADE  CHOLANGIOPANCREATOGRAPHY (ERCP) WITH PROPOFOL N/A 07/16/2021   Procedure: ENDOSCOPIC RETROGRADE CHOLANGIOPANCREATOGRAPHY (ERCP) WITH PROPOFOL;  Surgeon: Milus Banister, MD;  Location: [facility];  Service: Endoscopy;  Laterality: N/A;   REMOVAL OF STONES  07/16/2021   Procedure: REMOVAL OF STONES;  Surgeon: Milus Banister, MD;  Location: [facility];  Service: Endoscopy;;   SKIN CANCER EXCISION  12/2020   Squamous Cell Cancer   SPHINCTEROTOMY  07/16/2021   Procedure: SPHINCTEROTOMY;  Surgeon: Milus Banister, MD;  Location: [facility];  Service: Endoscopy;;   TIBIA FRACTURE SURGERY      History reviewed. No pertinent family history. Social History   Socioeconomic History   Marital status: Married    Spouse name: Not on file   Number of children: Not on file   Years of education: Not on file   Highest education level: Not on file  Occupational History   Not on file  Tobacco Use   Smoking status: Former    Types: Cigarettes    Quit date: 10/19/1975    Years since quitting: 45.9   Smokeless tobacco: Never  Substance and Sexual Activity   Alcohol use: No   Drug use: No   Sexual activity: Not on file  Other Topics Concern   Not on file  Social History Narrative   Not on file   Social Determinants of Health   Financial Resource Strain: Not on file  Food Insecurity: Not on file  Transportation Needs: Not on file  Physical Activity: Not on file  Stress: Not on file  Social Connections: Not on file    Review of Systems  Constitutional:  Positive for fatigue. Negative for appetite change and fever.  HENT:  Positive for congestion and rhinorrhea. Negative for ear pain and sore throat.   Respiratory:  Positive for cough. Negative for shortness of breath.   Cardiovascular:  Negative for chest pain and leg swelling.  Gastrointestinal:  Positive for abdominal pain, constipation and diarrhea. Negative for nausea and vomiting.  Genitourinary:  Negative for dysuria and  frequency.  Musculoskeletal:  Positive for arthralgias. Negative for myalgias.  Neurological:  Negative for dizziness and headaches.  Psychiatric/Behavioral:  Negative for dysphoric mood. The patient is not nervous/anxious.     Objective:  BP 124/70   Pulse 78   Temp (!) 97.3 F (36.3 C)   Resp 18   Ht 6\' 2"  (1.88 m)   Wt 259 lb (117.5 kg)   BMI 33.25 kg/m   BP/Weight 09/10/2021 08/10/2021 5/36/1443  Systolic BP 154 008 676  Diastolic BP 70 78 70  Wt. (Lbs) 259 256 250  BMI 33.25 32.87 32.98    Physical Exam Vitals reviewed.  Constitutional:      Appearance: Normal appearance.  HENT:     Nose: Nose normal.  Neck:     Vascular: No carotid bruit.  Cardiovascular:     Rate and Rhythm: Normal rate  and regular rhythm.     Pulses: Normal pulses.     Heart sounds: Normal heart sounds.  Pulmonary:     Effort: Pulmonary effort is normal.     Breath sounds: Normal breath sounds. No wheezing, rhonchi or rales.  Abdominal:     General: Bowel sounds are normal.     Palpations: Abdomen is soft.     Tenderness: There is no abdominal tenderness.  Skin:    General: Skin is warm and dry.  Neurological:     Mental Status: He is alert and oriented to person, place, and time.  Psychiatric:        Mood and Affect: Mood normal.        Behavior: Behavior normal.    Diabetic Foot Exam - Simple   Simple Foot Form  09/10/2021  1:42 PM  Visual Inspection No deformities, no ulcerations, no other skin breakdown bilaterally: Yes Sensation Testing Intact to touch and monofilament testing bilaterally: Yes Pulse Check Posterior Tibialis and Dorsalis pulse intact bilaterally: Yes Comments      Lab Results  Component Value Date   WBC 6.6 09/11/2021   HGB 14.3 09/11/2021   HCT 42.0 09/11/2021   PLT 118 (L) 09/11/2021   GLUCOSE 160 (H) 09/11/2021   CHOL 157 09/11/2021   TRIG 120 09/11/2021   HDL 32 (L) 09/11/2021   LDLCALC 103 (H) 09/11/2021   ALT 18 09/11/2021   AST 17  09/11/2021   NA 142 09/11/2021   K 4.8 09/11/2021   CL 106 09/11/2021   CREATININE 1.15 09/11/2021   BUN 15 09/11/2021   CO2 23 09/11/2021   TSH 3.590 10/06/2020   HGBA1C 7.4 (H) 09/11/2021   MICROALBUR 80 09/10/2021      Assessment & Plan:   Problem List Items Addressed This Visit       Cardiovascular and Mediastinum   Hypertensive heart disease, benign, 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  Continue aspirin 325 mg once daily, Toprol-XL 25 mg once daily, Micardis 80 mg once daily Continue to work on eating a healthy diet and exercise.  Pt to return for labs tomorrow         Endocrine   Diabetic polyneuropathy associated with type 2 diabetes mellitus ([place]) - Primary    Control:  Recommend check sugars fasting daily. Recommend check feet daily. Recommend annual eye exams. Medicines: Diabetes medicines include farxiga 10 mg once daily, gabapentin 300 mg twice daily for neuropathy, NovoLog Mix 70/30 16 units in the skin before breakfast and 40 units before supper, and metformin 1000 mg twice daily. Continue to work on eating a healthy diet and exercise.         Relevant Orders   POCT UA - Microalbumin (Completed)     Hematopoietic and Hemostatic   Thrombocytopenia (HCC)    stable        Other   Mixed hyperlipidemia    Continue zetia. Hope to restart statin.  Recommend continue to work on eating healthy diet and exercise.       Elevated liver transaminase level    Resolved with ERCP stone retrieval.      Class 1 obesity due to excess calories with serious comorbidity and body mass index (BMI) of 33.0 to 33.9 in adult    Recommend continue to work on eating healthy diet and exercise.      .  No orders of the defined types were placed in this encounter.   Orders Placed This Encounter  Procedures   POCT UA - Microalbumin     Follow-up: Return in about 4 months (around 01/08/2022) for chronic fasting.  An After Visit Summary was printed and given to the  patient.   I,Lauren M Auman,acting as a scribe for Rochel Brome, MD.,have documented all relevant documentation on the behalf of Rochel Brome, MD,as directed by  Rochel Brome, MD while in the presence of Rochel Brome, MD.    Rochel Brome, MD [place] [phone]

## 2021-09-10 ENCOUNTER — Other Ambulatory Visit: Payer: Self-pay

## 2021-09-10 ENCOUNTER — Ambulatory Visit (INDEPENDENT_AMBULATORY_CARE_PROVIDER_SITE_OTHER): Payer: PPO | Admitting: Family Medicine

## 2021-09-10 VITALS — BP 124/70 | HR 78 | Temp 97.3°F | Resp 18 | Ht 74.0 in | Wt 259.0 lb

## 2021-09-10 DIAGNOSIS — R7401 Elevation of levels of liver transaminase levels: Secondary | ICD-10-CM | POA: Diagnosis not present

## 2021-09-10 DIAGNOSIS — E6609 Other obesity due to excess calories: Secondary | ICD-10-CM

## 2021-09-10 DIAGNOSIS — E782 Mixed hyperlipidemia: Secondary | ICD-10-CM | POA: Diagnosis not present

## 2021-09-10 DIAGNOSIS — D696 Thrombocytopenia, unspecified: Secondary | ICD-10-CM

## 2021-09-10 DIAGNOSIS — E1142 Type 2 diabetes mellitus with diabetic polyneuropathy: Secondary | ICD-10-CM

## 2021-09-10 DIAGNOSIS — Z6833 Body mass index (BMI) 33.0-33.9, adult: Secondary | ICD-10-CM

## 2021-09-10 DIAGNOSIS — I119 Hypertensive heart disease without heart failure: Secondary | ICD-10-CM | POA: Diagnosis not present

## 2021-09-10 LAB — POCT UA - MICROALBUMIN: Microalbumin Ur, POC: 80 mg/L

## 2021-09-11 ENCOUNTER — Other Ambulatory Visit: Payer: Self-pay

## 2021-09-11 DIAGNOSIS — I119 Hypertensive heart disease without heart failure: Secondary | ICD-10-CM | POA: Diagnosis not present

## 2021-09-11 DIAGNOSIS — E782 Mixed hyperlipidemia: Secondary | ICD-10-CM

## 2021-09-11 DIAGNOSIS — E1142 Type 2 diabetes mellitus with diabetic polyneuropathy: Secondary | ICD-10-CM

## 2021-09-12 LAB — CBC WITH DIFFERENTIAL/PLATELET
Basophils Absolute: 0.1 10*3/uL (ref 0.0–0.2)
Basos: 1 %
EOS (ABSOLUTE): 0.4 10*3/uL (ref 0.0–0.4)
Eos: 6 %
Hematocrit: 42 % (ref 37.5–51.0)
Hemoglobin: 14.3 g/dL (ref 13.0–17.7)
Immature Grans (Abs): 0 10*3/uL (ref 0.0–0.1)
Immature Granulocytes: 0 %
Lymphocytes Absolute: 1.5 10*3/uL (ref 0.7–3.1)
Lymphs: 23 %
MCH: 32.8 pg (ref 26.6–33.0)
MCHC: 34 g/dL (ref 31.5–35.7)
MCV: 96 fL (ref 79–97)
Monocytes Absolute: 0.4 10*3/uL (ref 0.1–0.9)
Monocytes: 6 %
Neutrophils Absolute: 4.3 10*3/uL (ref 1.4–7.0)
Neutrophils: 64 %
Platelets: 118 10*3/uL — ABNORMAL LOW (ref 150–450)
RBC: 4.36 x10E6/uL (ref 4.14–5.80)
RDW: 12.5 % (ref 11.6–15.4)
WBC: 6.6 10*3/uL (ref 3.4–10.8)

## 2021-09-12 LAB — COMPREHENSIVE METABOLIC PANEL
ALT: 18 IU/L (ref 0–44)
AST: 17 IU/L (ref 0–40)
Albumin/Globulin Ratio: 1.7 (ref 1.2–2.2)
Albumin: 3.8 g/dL (ref 3.6–4.6)
Alkaline Phosphatase: 96 IU/L (ref 44–121)
BUN/Creatinine Ratio: 13 (ref 10–24)
BUN: 15 mg/dL (ref 8–27)
Bilirubin Total: 0.4 mg/dL (ref 0.0–1.2)
CO2: 23 mmol/L (ref 20–29)
Calcium: 9.3 mg/dL (ref 8.6–10.2)
Chloride: 106 mmol/L (ref 96–106)
Creatinine, Ser: 1.15 mg/dL (ref 0.76–1.27)
Globulin, Total: 2.2 g/dL (ref 1.5–4.5)
Glucose: 160 mg/dL — ABNORMAL HIGH (ref 70–99)
Potassium: 4.8 mmol/L (ref 3.5–5.2)
Sodium: 142 mmol/L (ref 134–144)
Total Protein: 6 g/dL (ref 6.0–8.5)
eGFR: 64 mL/min/{1.73_m2} (ref >59)

## 2021-09-12 LAB — LIPID PANEL
Chol/HDL Ratio: 4.9 ratio (ref 0.0–5.0)
Cholesterol, Total: 157 mg/dL (ref 100–199)
HDL: 32 mg/dL — ABNORMAL LOW (ref >39)
LDL Chol Calc (NIH): 103 mg/dL — ABNORMAL HIGH (ref 0–99)
Triglycerides: 120 mg/dL (ref 0–149)
VLDL Cholesterol Cal: 22 mg/dL (ref 5–40)

## 2021-09-12 LAB — HEMOGLOBIN A1C
Est. average glucose Bld gHb Est-mCnc: 166 mg/dL
Hgb A1c MFr Bld: 7.4 % — ABNORMAL HIGH (ref 4.8–5.6)

## 2021-09-12 LAB — CARDIOVASCULAR RISK ASSESSMENT

## 2021-09-13 ENCOUNTER — Encounter: Payer: Self-pay | Admitting: Family Medicine

## 2021-09-13 DIAGNOSIS — D696 Thrombocytopenia, unspecified: Secondary | ICD-10-CM | POA: Insufficient documentation

## 2021-09-13 NOTE — Assessment & Plan Note (Signed)
stable °

## 2021-09-13 NOTE — Assessment & Plan Note (Signed)
Recommend continue to work on eating healthy diet and exercise.  

## 2021-09-13 NOTE — Assessment & Plan Note (Signed)
Well controlled.  Continue aspirin 325 mg once daily, Toprol-XL 25 mg once daily, Micardis 80 mg once daily Continue to work on eating a healthy diet and exercise.  Pt to return for labs tomorrow

## 2021-09-13 NOTE — Assessment & Plan Note (Signed)
Continue zetia. Hope to restart statin.  Recommend continue to work on eating healthy diet and exercise.

## 2021-09-13 NOTE — Assessment & Plan Note (Signed)
Control:  Recommend check sugars fasting daily. Recommend check feet daily. Recommend annual eye exams. Medicines: Diabetes medicines include farxiga 10 mg once daily, gabapentin 300 mg twice daily for neuropathy, NovoLog Mix 70/30 16 units in the skin before breakfast and 40 units before supper, and metformin 1000 mg twice daily. Continue to work on eating a healthy diet and exercise.

## 2021-09-13 NOTE — Progress Notes (Signed)
Blood count normal except platelets low. Stable.   Liver function normal. All including alkaline phosphatase now normal. Kidney function normal.  Cholesterol: LDL little high at 103. Recommend continue zetia and restart statin. Crestor 5 mg daily or lipitor 10 mg daily. Ask if they have a preference. Recommend recheck liver function (CMP) in 6 weeks.  HBA1C: improved to 7.4 from 7.9.

## 2021-09-13 NOTE — Assessment & Plan Note (Signed)
Resolved with ERCP stone retrieval.

## 2021-09-14 ENCOUNTER — Other Ambulatory Visit: Payer: Self-pay

## 2021-09-14 DIAGNOSIS — R945 Abnormal results of liver function studies: Secondary | ICD-10-CM

## 2021-09-15 DIAGNOSIS — L570 Actinic keratosis: Secondary | ICD-10-CM | POA: Diagnosis not present

## 2021-09-22 ENCOUNTER — Ambulatory Visit (INDEPENDENT_AMBULATORY_CARE_PROVIDER_SITE_OTHER): Payer: PPO | Admitting: Nurse Practitioner

## 2021-09-22 ENCOUNTER — Encounter: Payer: Self-pay | Admitting: Nurse Practitioner

## 2021-09-22 VITALS — BP 138/64 | HR 61 | Temp 97.7°F | Resp 18 | Ht 73.0 in | Wt 264.0 lb

## 2021-09-22 DIAGNOSIS — H9201 Otalgia, right ear: Secondary | ICD-10-CM | POA: Diagnosis not present

## 2021-09-22 DIAGNOSIS — J018 Other acute sinusitis: Secondary | ICD-10-CM | POA: Diagnosis not present

## 2021-09-22 MED ORDER — AZITHROMYCIN 250 MG PO TABS: ORAL_TABLET | ORAL | 0 refills | Status: AC

## 2021-09-22 MED ORDER — FLUTICASONE PROPIONATE 50 MCG/ACT NA SUSP: 2.0000 | Freq: Every day | NASAL | 6 refills | Status: DC

## 2021-09-22 NOTE — Progress Notes (Signed)
Acute Office Visit  Subjective:    Patient ID: Andre Shaffer, male    DOB: [birth year], 80 y.o.   MRN: [id number]  Chief Complaint  Patient presents with   Ear Pain    HPI: Patient is in today for left ear ache. Onset of symptoms was three days ago. Denies treatment. He tells me that he has experienced sinus congestion, post-nasal-drip, and cough for approximately three weeks. States he has a low grade fever yesterday of 59 F.   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    Past Surgical History:  Procedure Laterality Date   ANKLE SURGERY  93,03   rt and lt    CHOLECYSTECTOMY  10/19/2012   Procedure: LAPAROSCOPIC CHOLECYSTECTOMY WITH INTRAOPERATIVE CHOLANGIOGRAM;  Surgeon: Ralene Ok, MD;  Location: [facility];  Service: General;  Laterality: N/A;   COLONOSCOPY     CORONARY ARTERY BYPASS GRAFT  1997   ENDOSCOPIC RETROGRADE CHOLANGIOPANCREATOGRAPHY (ERCP) WITH PROPOFOL N/A 07/16/2021   Procedure: ENDOSCOPIC RETROGRADE CHOLANGIOPANCREATOGRAPHY (ERCP) WITH PROPOFOL;  Surgeon: Milus Banister, MD;  [facility];  Service: Endoscopy;  Laterality: N/A;   REMOVAL OF STONES  07/16/2021   Procedure: REMOVAL OF STONES;  Surgeon: Milus Banister, MD;  [facility];  Service: Endoscopy;;   SKIN CANCER EXCISION  12/2020   Squamous Cell Cancer   SPHINCTEROTOMY  07/16/2021   Procedure: SPHINCTEROTOMY;  Surgeon: Milus Banister, MD;  [facility];  Service: Endoscopy;;   TIBIA FRACTURE SURGERY      History reviewed. No pertinent family history.  Social History    Socioeconomic History   Marital status: Married    Spouse name: Not on file   Number of children: Not on file   Years of education: Not on file   Highest education level: Not on file  Occupational History   Not on file  Tobacco Use   Smoking status: Former    Types: Cigarettes    Quit date: 10/19/1975    Years since quitting: 45.9   Smokeless tobacco: Never  Substance and Sexual Activity   Alcohol use: No   Drug use: No   Sexual activity: Not on file  Other Topics Concern   Not on file  Social History Narrative   Not on file   Social Determinants of Health   Financial Resource Strain: Not on file  Food Insecurity: Not on file  Transportation Needs: Not on file  Physical Activity: Not on file  Stress: Not on file  Social Connections: Not on file  Intimate Partner Violence: Not on file    Outpatient Medications Prior to Visit  Medication Sig Dispense Refill   aspirin EC 325 MG tablet Take 325 mg by mouth daily.     Cyanocobalamin 1000 MCG/ML LIQD Take 2,500 mcg by mouth See admin instructions. Place 0.5 dropperful (2,500 mcg) sublingually daily     dapagliflozin propanediol (FARXIGA) 10 MG TABS tablet Take 1 tablet (10 mg total) by mouth daily before breakfast. 28 tablet 0   ezetimibe (ZETIA) 10 MG tablet TAKE ONE (1) TABLET BY  MOUTH ONCE DAILY (Patient taking differently: Take 10 mg by mouth every evening.) 90 tablet 1   fluticasone (FLONASE) 50 MCG/ACT nasal spray Place 2 sprays into both nostrils daily. 16 g 6   FREESTYLE LITE test strip USE TO CHECK BLOOD SUGAR TWICE DAILY 200 each 2   gabapentin (NEURONTIN) 300 MG capsule TAKE ONE CAPSULE BY MOUTH TWICE A DAY (Patient taking differently: Take 300 mg by mouth See admin instructions. Take 300 mg daily, may take a second 300 mg dose as needed for pain) 180 capsule 3   ibuprofen (ADVIL,MOTRIN) 200 MG tablet Take 200 mg by mouth every 8 (eight) hours as needed for moderate pain or mild pain.     insulin aspart protamine-  aspart (NOVOLOG MIX 70/30) (70-30) 100 UNIT/ML injection Inject 40-60 Units into the skin See admin instructions. Inject 60 units into the skin before breakfast and 40 units units before supper/evening meal     metFORMIN (GLUCOPHAGE) 1000 MG tablet TAKE ONE TABLET TWICE DAILY 180 tablet 1   metoprolol succinate (TOPROL-XL) 25 MG 24 hr tablet TAKE ONE (1) TABLET ONCE DAILY 90 tablet 1   telmisartan (MICARDIS) 80 MG tablet TAKE ONE (1) TABLET BY MOUTH ONCE DAILY 90 tablet 1   No facility-administered medications prior to visit.    Allergies  Allergen Reactions   Nitroglycerin Other (See Comments)    Blood pressure 'bottomed out'   Statins Other (See Comments)    Elevation of liver enzymes   Tetanus Toxoid Other (See Comments)    Severe flu symptoms fever and chills   Penicillins Rash    Review of Systems  Constitutional:  Positive for chills and fever.  HENT:  Positive for congestion, ear pain (right ear), postnasal drip, rhinorrhea, sinus pressure and sinus pain. Negative for sore throat and trouble swallowing.   Eyes: Negative.   Respiratory:  Positive for cough and shortness of breath.   Cardiovascular: Negative.   Gastrointestinal: Negative.   Endocrine: Negative.   Genitourinary: Negative.   Musculoskeletal: Negative.   Skin: Negative.   Allergic/Immunologic: Positive for environmental allergies.  Neurological: Negative.   Hematological: Negative.   Psychiatric/Behavioral: Negative.        Objective:    Physical Exam Vitals reviewed.  HENT:     Right Ear: Tympanic membrane normal.     Left Ear: Tympanic membrane normal.     Nose: Congestion and rhinorrhea present.     Mouth/Throat:     Mouth: Mucous membranes are dry.     Pharynx: Posterior oropharyngeal erythema present.  Cardiovascular:     Rate and Rhythm: Normal rate. Rhythm irregular.     Pulses: Normal pulses.  Pulmonary:     Effort: Pulmonary effort is normal.     Breath sounds: Normal breath sounds.   Abdominal:     General: Bowel sounds are normal.     Palpations: Abdomen is soft.  Musculoskeletal:     Cervical back: Neck supple.  Skin:    General: Skin is warm and dry.  Neurological:     General: No focal deficit present.     Mental Status: He is alert and oriented to person, place, and time.    BP 138/64   Pulse 61   Temp 97.7 F (36.5 C)   Resp 18   Ht _0  (1.854 m)   Wt 264 lb (119.7 kg)   SpO2 95%   BMI 34.83 kg/m  Wt Readings from Last 3 Encounters:  09/22/21 264 lb (119.7 kg)  09/10/21 259 lb (117.5 kg)  08/10/21 256 lb (116.1 kg)    Health Maintenance Due  Topic Date Due   TETANUS/TDAP  Never done   Zoster Vaccines- Shingrix (1 of 2) Never done       Lab Results  Component Value Date   TSH 3.590 10/06/2020   Lab Results  Component Value Date   WBC 6.6 09/11/2021   HGB 14.3 09/11/2021   HCT 42.0 09/11/2021   MCV 96 09/11/2021   PLT 118 (L) 09/11/2021   Lab Results  Component Value Date   NA 142 09/11/2021   K 4.8 09/11/2021   CO2 23 09/11/2021   GLUCOSE 160 (H) 09/11/2021   BUN 15 09/11/2021   CREATININE 1.15 09/11/2021   BILITOT 0.4 09/11/2021   ALKPHOS 96 09/11/2021   AST 17 09/11/2021   ALT 18 09/11/2021   PROT 6.0 09/11/2021   ALBUMIN 3.8 09/11/2021   CALCIUM 9.3 09/11/2021   ANIONGAP 10 05/01/2021   EGFR 64 09/11/2021   Lab Results  Component Value Date   CHOL 157 09/11/2021   Lab Results  Component Value Date   HDL 32 (L) 09/11/2021   Lab Results  Component Value Date   LDLCALC 103 (H) 09/11/2021   Lab Results  Component Value Date   TRIG 120 09/11/2021   Lab Results  Component Value Date   CHOLHDL 4.9 09/11/2021   Lab Results  Component Value Date   HGBA1C 7.4 (H) 09/11/2021       Assessment & Plan:    1. Acute non-recurrent sinusitis of other sinus - azithromycin (ZITHROMAX) 250 MG tablet; Take 2 tablets on day 1, then 1 tablet daily on days 2 through 5  Dispense: 6 tablet; Refill: 0 - fluticasone  (FLONASE) 50 MCG/ACT nasal spray; Place 2 sprays into both nostrils daily.  Dispense: 16 g; Refill: 6  2. Acute otalgia, right  -Tylenol or Ibuprofen as directed for right ear pain   Take Zpack as prescribed Use Flonase nasal spray daily Take Ibuprofen as directed Follow-up as needed    I,Victoria C Greene,acting as a scribe for [employer], NP.,have documented all relevant documentation on the behalf of Rip Harbour, NP,as directed by  Rip Harbour, NP while in the presence of Rip Harbour, NP.  I, Rip Harbour, NP, have reviewed all documentation for this visit. The documentation on 09/22/21 for the exam, diagnosis, procedures, and orders are all accurate and complete.    Follow-up: PRN  An After Visit Summary was printed and given to the patient.  Rip Harbour, NP [place] [phone]

## 2021-09-22 NOTE — Patient Instructions (Signed)
Take Zpack as prescribed Use Flonase nasal spray daily Take Ibuprofen as directed Follow-up as needed  Sinusitis, Adult Sinusitis is soreness and swelling (inflammation) of your sinuses. Sinuses are hollow spaces in the bones around your face. They are located: Around your eyes. In the middle of your forehead. Behind your nose. In your cheekbones. Your sinuses and nasal passages are lined with a fluid called mucus. Mucus drains out of your sinuses. Swelling can trap mucus in your sinuses. This lets germs (bacteria, virus, or fungus) grow, which leads to infection. Most of the time, this condition is caused by a virus. What are the causes? This condition is caused by: Allergies. Asthma. Germs. Things that block your nose or sinuses. Growths in the nose (nasal polyps). Chemicals or irritants in the air. Fungus (rare). What increases the risk? You are more likely to develop this condition if: You have a weak body defense system (immune system). You do a lot of swimming or diving. You use nasal sprays too much. You smoke. What are the signs or symptoms? The main symptoms of this condition are pain and a feeling of pressure around the sinuses. Other symptoms include: Stuffy nose (congestion). Runny nose (drainage). Swelling and warmth in the sinuses. Headache. Toothache. A cough that may get worse at night. Mucus that collects in the throat or the back of the nose (postnasal drip). Being unable to smell and taste. Being very tired (fatigue). A fever. Sore throat. Bad breath. How is this diagnosed? This condition is diagnosed based on: Your symptoms. Your medical history. A physical exam. Tests to find out if your condition is short-term (acute) or long-term (chronic). Your doctor may: Check your nose for growths (polyps). Check your sinuses using a tool that has a light (endoscope). Check for allergies or germs. Do imaging tests, such as an MRI or CT scan. How is this  treated? Treatment for this condition depends on the cause and whether it is short-term or long-term. If caused by a virus, your symptoms should go away on their own within 10 days. You may be given medicines to relieve symptoms. They include: Medicines that shrink swollen tissue in the nose. Medicines that treat allergies (antihistamines). A spray that treats swelling of the nostrils.  Rinses that help get rid of thick mucus in your nose (nasal saline washes). If caused by bacteria, your doctor may wait to see if you will get better without treatment. You may be given antibiotic medicine if you have: A very bad infection. A weak body defense system. If caused by growths in the nose, you may need to have surgery. Follow these instructions at home: Medicines Take, use, or apply over-the-counter and prescription medicines only as told by your doctor. These may include nasal sprays. If you were prescribed an antibiotic medicine, take it as told by your doctor. Do not stop taking the antibiotic even if you start to feel better. Hydrate and humidify  Drink enough water to keep your pee (urine) pale yellow. Use a cool mist humidifier to keep the humidity level in your home above 50%. Breathe in steam for 10-15 minutes, 3-4 times a day, or as told by your doctor. You can do this in the bathroom while a hot shower is running. Try not to spend time in cool or dry air. Rest Rest as much as you can. Sleep with your head raised (elevated). Make sure you get enough sleep each night. General instructions  Put a warm, moist washcloth on your face  3-4 times a day, or as often as told by your doctor. This will help with discomfort. Wash your hands often with soap and water. If there is no soap and water, use hand sanitizer. Do not smoke. Avoid being around people who are smoking (secondhand smoke). Keep all follow-up visits as told by your doctor. This is important. Contact a doctor if: You have a  fever. Your symptoms get worse. Your symptoms do not get better within 10 days. Get help right away if: You have a very bad headache. You cannot stop throwing up (vomiting). You have very bad pain or swelling around your face or eyes. You have trouble seeing. You feel confused. Your neck is stiff. You have trouble breathing. Summary Sinusitis is swelling of your sinuses. Sinuses are hollow spaces in the bones around your face. This condition is caused by tissues in your nose that become inflamed or swollen. This traps germs. These can lead to infection. If you were prescribed an antibiotic medicine, take it as told by your doctor. Do not stop taking it even if you start to feel better. Keep all follow-up visits as told by your doctor. This is important. This information is not intended to replace advice given to you by your health care provider. Make sure you discuss any questions you have with your health care provider. Document Revised: 03/20/2018 Document Reviewed: 03/20/2018 Elsevier Patient Education  2022 [employer].

## 2021-09-30 ENCOUNTER — Other Ambulatory Visit: Payer: Self-pay | Admitting: Family Medicine

## 2021-09-30 DIAGNOSIS — E1142 Type 2 diabetes mellitus with diabetic polyneuropathy: Secondary | ICD-10-CM

## 2021-10-27 ENCOUNTER — Other Ambulatory Visit: Payer: Self-pay | Admitting: Family Medicine

## 2021-10-27 ENCOUNTER — Other Ambulatory Visit: Payer: PPO

## 2021-10-27 ENCOUNTER — Telehealth: Payer: Self-pay | Admitting: Family Medicine

## 2021-10-27 NOTE — Telephone Encounter (Signed)
Andre Shaffer called.  Patient was for was supposed to have a chemistry panel today.  I had recommended starting him on a statin medicine 6 to 8 weeks ago.  He has not done this.  We had held this in the past due to his new liver functions went up and down.  Over the summer and fall he had some intrahepatic stones removed which I was concerned had been the issue all along and was hopeful be able to put him back on the statin.  Andre Shaffer and he will discuss this and we can discuss at his follow-up appointment.  As such she does not need to get a chemistry panel today and we will cancel this appointment.  In addition the patient never started fark [employer].  His A1c came down and he and his wife decided this was not what they would like to do.

## 2021-10-29 ENCOUNTER — Other Ambulatory Visit: Payer: Self-pay

## 2021-10-29 MED ORDER — EZETIMIBE 10 MG PO TABS: 10.0000 mg | ORAL_TABLET | Freq: Every day | ORAL | 1 refills | Status: DC

## 2021-11-04 ENCOUNTER — Other Ambulatory Visit: Payer: Self-pay

## 2021-11-04 DIAGNOSIS — E1142 Type 2 diabetes mellitus with diabetic polyneuropathy: Secondary | ICD-10-CM

## 2021-11-04 MED ORDER — METFORMIN HCL 1000 MG PO TABS: 1000.0000 mg | ORAL_TABLET | Freq: Two times a day (BID) | ORAL | 1 refills | Status: DC

## 2021-11-04 MED ORDER — METOPROLOL SUCCINATE ER 25 MG PO TB24
ORAL_TABLET | ORAL | 3 refills | Status: DC
Start: 2021-11-04 — End: 2022-12-23

## 2021-11-04 MED ORDER — TELMISARTAN 80 MG PO TABS: ORAL_TABLET | ORAL | 3 refills | Status: DC

## 2021-11-04 MED ORDER — GABAPENTIN 300 MG PO CAPS: 300.0000 mg | ORAL_CAPSULE | Freq: Two times a day (BID) | ORAL | 3 refills | Status: DC

## 2021-11-04 MED ORDER — FREESTYLE LITE TEST VI STRP: ORAL_STRIP | 2 refills | Status: DC

## 2021-11-04 NOTE — Telephone Encounter (Signed)
Needs all new scripts sent to [facility], [place] 11/04/21 11:44 AM

## 2021-11-05 ENCOUNTER — Other Ambulatory Visit: Payer: Self-pay

## 2021-11-05 MED ORDER — ONETOUCH ULTRASOFT LANCETS MISC: 12 refills | Status: AC

## 2021-11-05 MED ORDER — GLUCOSE BLOOD VI STRP: ORAL_STRIP | 12 refills | Status: DC

## 2021-11-05 MED ORDER — ONETOUCH VERIO W/DEVICE KIT: PACK | 0 refills | Status: AC

## 2022-01-12 ENCOUNTER — Other Ambulatory Visit: Payer: Self-pay

## 2022-01-12 ENCOUNTER — Ambulatory Visit (INDEPENDENT_AMBULATORY_CARE_PROVIDER_SITE_OTHER): Payer: PPO | Admitting: Family Medicine

## 2022-01-12 ENCOUNTER — Encounter: Payer: Self-pay | Admitting: Family Medicine

## 2022-01-12 VITALS — BP 120/60 | HR 72 | Temp 97.2°F | Resp 18 | Ht 73.0 in | Wt 262.0 lb

## 2022-01-12 DIAGNOSIS — E782 Mixed hyperlipidemia: Secondary | ICD-10-CM

## 2022-01-12 DIAGNOSIS — E6609 Other obesity due to excess calories: Secondary | ICD-10-CM

## 2022-01-12 DIAGNOSIS — I119 Hypertensive heart disease without heart failure: Secondary | ICD-10-CM

## 2022-01-12 DIAGNOSIS — Z6833 Body mass index (BMI) 33.0-33.9, adult: Secondary | ICD-10-CM | POA: Diagnosis not present

## 2022-01-12 DIAGNOSIS — E1142 Type 2 diabetes mellitus with diabetic polyneuropathy: Secondary | ICD-10-CM

## 2022-01-12 DIAGNOSIS — D696 Thrombocytopenia, unspecified: Secondary | ICD-10-CM

## 2022-01-12 NOTE — Progress Notes (Signed)
? ?Subjective:  ?Patient ID: Andre Shaffer, male    DOB: 1941-05-24  Age: 81 y.o. MRN: [id number] ? ?Chief Complaint  ?Patient presents with  ? Diabetes  ? Hyperlipidemia  ? Hypertension  ? ? ?Diabetes:  ?Complications: neuropathy.  ?Glucose checking: Checks sugars fasting daily ?Glucose logs: 100-190s ?Hypoglycemia: Patient has low sugars approximately once a month. ?Most recent A1C: 7.4 ?Current medications: Metformin 1000 mg take 1 tablet BID, Novolog insulin injection 70/30 60 U in am and 40 U before supper, Gabapentin 300 mg 1 capsule twice daily. ?Last Eye Exam: 08/09/2021 - The patient seen the Eye doctor at the [place] around 11/22, and they stated that the patient has a wrinkle in his right eye and it would be monitored closely. He was instructed to call them back if he had experienced any significant eyesight changes. Following up in 6 months.  ?Foot checks: daily.  ? ?Hyperlipidemia: ?Current medications: Zetia 10 mg 1 tablet daily. ? ?Hypertension/CAD: ?Current medications: Metoprolol 25 mg take 1 tablet daily, Telmisartan 80 mg take 1 tablet daily. ? ?Diet: healthy.  ?Exercise: no  ?  ?Current Outpatient Medications on File Prior to Visit  ?Medication Sig Dispense Refill  ? aspirin EC 325 MG tablet Take 325 mg by mouth daily.    ? Blood Glucose Monitoring Suppl (ONETOUCH VERIO) w/Device KIT Use as instructed. Check blood glucose twice daily. E11.42 1 kit 0  ? Cyanocobalamin 1000 MCG/ML LIQD Take 2,500 mcg by mouth See admin instructions. Place 0.5 dropperful (2,500 mcg) sublingually daily    ? ezetimibe (ZETIA) 10 MG tablet Take 1 tablet (10 mg total) by mouth daily. 90 tablet 1  ? fluticasone (FLONASE) 50 MCG/ACT nasal spray Place 2 sprays into both nostrils daily. 16 g 6  ? gabapentin (NEURONTIN) 300 MG capsule Take 1 capsule (300 mg total) by mouth 2 (two) times daily. 180 capsule 3  ? glucose blood test strip Use as instructed, Check blood glucose twice daily. 100 each 12  ? glucose blood test strip     ?  ibuprofen (ADVIL,MOTRIN) 200 MG tablet Take 200 mg by mouth every 8 (eight) hours as needed for moderate pain or mild pain.    ? insulin aspart protamine- aspart (NOVOLOG MIX 70/30) (70-30) 100 UNIT/ML injection Inject 40-60 Units into the skin See admin instructions. Inject 60 units into the skin before breakfast and 40 units units before supper/evening meal    ? Lancets (ONETOUCH ULTRASOFT) lancets Use as instructed, Check blood glucose twice daily. 100 each 12  ? metFORMIN (GLUCOPHAGE) 1000 MG tablet Take 1 tablet (1,000 mg total) by mouth 2 (two) times daily. 180 tablet 1  ? metoprolol succinate (TOPROL-XL) 25 MG 24 hr tablet TAKE ONE (1) TABLET ONCE DAILY 90 tablet 3  ? telmisartan (MICARDIS) 80 MG tablet TAKE ONE (1) TABLET BY MOUTH ONCE DAILY 90 tablet 3  ? ?No current facility-administered medications on file prior to visit.  ? ?Past Medical History:  ?Diagnosis Date  ? Abnormal liver function   ? Arthritis   ? Atherosclerotic heart disease of native coronary artery without angina pectoris   ? Diabetes mellitus without complication ([place])   ? GERD (gastroesophageal reflux disease)   ? Hyperlipemia   ? Hypertension   ? MI (myocardial infarction) ([place])   ? SCC (squamous cell carcinoma), arm, left   ? 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 ?Past Surgical History:  ?Procedure Laterality Date  ? ANKLE SURGERY  93,03  ? rt  and lt   ? CHOLECYSTECTOMY  10/19/2012  ? Procedure: LAPAROSCOPIC CHOLECYSTECTOMY WITH INTRAOPERATIVE CHOLANGIOGRAM;  Surgeon: Ralene Ok, MD;  Location: [facility];  Service: General;  Laterality: N/A;  ? COLONOSCOPY    ? CORONARY ARTERY BYPASS GRAFT  1997  ? ENDOSCOPIC RETROGRADE CHOLANGIOPANCREATOGRAPHY (ERCP) WITH PROPOFOL N/A 07/16/2021  ? Procedure: ENDOSCOPIC RETROGRADE CHOLANGIOPANCREATOGRAPHY (ERCP) WITH PROPOFOL;  Surgeon: Milus Banister, MD;  Location: [facility];  Service: Endoscopy;  Laterality: N/A;  ? REMOVAL OF STONES  07/16/2021  ? Procedure:  REMOVAL OF STONES;  Surgeon: Milus Banister, MD;  Location: [facility];  Service: Endoscopy;;  ? SKIN CANCER EXCISION  12/2020  ? Squamous Cell Cancer  ? SPHINCTEROTOMY  07/16/2021  ? Procedure: SPHINCTEROTOMY;  Surgeon: Milus Banister, MD;  Location: [facility];  Service: Endoscopy;;  ? TIBIA FRACTURE SURGERY    ?  ?History reviewed. No pertinent family history. ?Social History  ? ?Socioeconomic History  ? Marital status: Married  ?  Spouse name: Not on file  ? Number of children: Not on file  ? Years of education: Not on file  ? Highest education level: Not on file  ?Occupational History  ? Not on file  ?Tobacco Use  ? Smoking status: Former  ?  Packs/day: 1.00  ?  Years: 20.00  ?  Pack years: 20.00  ?  Types: Cigarettes  ?  Quit date: 10/19/1975  ?  Years since quitting: 46.2  ? Smokeless tobacco: Never  ?Substance and Sexual Activity  ? Alcohol use: No  ? Drug use: No  ? Sexual activity: Not on file  ?Other Topics Concern  ? Not on file  ?Social History Narrative  ? Not on file  ? ?Social Determinants of Health  ? ?Financial Resource Strain: Not on file  ?Food Insecurity: Not on file  ?Transportation Needs: Not on file  ?Physical Activity: Not on file  ?Stress: Not on file  ?Social Connections: Not on file  ? ? ?Review of Systems  ?Constitutional:  Positive for fatigue. Negative for appetite change and fever.  ?HENT:  Negative for congestion, ear pain, sinus pressure and sore throat.   ?Respiratory:  Positive for cough and shortness of breath. Negative for chest tightness and wheezing.   ?Cardiovascular:  Negative for chest pain and palpitations.  ?Gastrointestinal:  Positive for constipation. Negative for abdominal pain, diarrhea, nausea and vomiting.  ?Genitourinary:  Negative for dysuria and hematuria.  ?Musculoskeletal:  Negative for arthralgias, back pain, joint swelling and myalgias.  ?Skin:  Negative for rash.  ?Neurological:  Positive for weakness and light-headedness. Negative for dizziness and  headaches.  ?Psychiatric/Behavioral:  Negative for dysphoric mood. The patient is not nervous/anxious.   ? ? ?Objective:  ?BP 120/60   Pulse 72   Temp (!) 97.2 ?F (36.2 ?C)   Resp 18   Ht '6\' 1"'  (1.854 m)   Wt 262 lb (118.8 kg)   BMI 34.57 kg/m?  ? ? ?  01/12/2022  ?  9:25 AM 09/22/2021  ? 10:56 AM 09/10/2021  ?  1:43 PM  ?BP/Weight  ?Systolic BP 446 950 722  ?Diastolic BP 60 64 70  ?Wt. (Lbs) 262 264 259  ?BMI 34.57 kg/m2 34.83 kg/m2 33.25 kg/m2  ? ? ?Physical Exam ?Vitals reviewed.  ?Constitutional:   ?   Appearance: Normal appearance. He is normal weight.  ?Cardiovascular:  ?   Rate and Rhythm: Normal rate and regular rhythm.  ?   Pulses: Normal pulses.  ?  Heart sounds: Normal heart sounds.  ?Pulmonary:  ?   Breath sounds: Normal breath sounds.  ?Abdominal:  ?   General: Abdomen is flat. Bowel sounds are normal.  ?   Palpations: Abdomen is soft.  ?Neurological:  ?   Mental Status: He is alert and oriented to person, place, and time.  ?Psychiatric:     ?   Mood and Affect: Mood normal.     ?   Behavior: Behavior normal.  ? ? ?Diabetic Foot Exam - Simple   ?Simple Foot Form ? 01/12/2022  3:44 PM  ?Visual Inspection ?No deformities, no ulcerations, no other skin breakdown bilaterally: Yes ?Sensation Testing ?See comments: Yes ?Pulse Check ?Posterior Tibialis and Dorsalis pulse intact bilaterally: Yes ?Comments ?Decreased sensation in feet. ?  ?  ? ?Lab Results  ?Component Value Date  ? WBC 7.8 01/12/2022  ? HGB 15.5 01/12/2022  ? HCT 45.8 01/12/2022  ? PLT 149 (L) 01/12/2022  ? GLUCOSE 86 01/12/2022  ? CHOL 159 01/12/2022  ? TRIG 98 01/12/2022  ? HDL 35 (L) 01/12/2022  ? LDLCALC 106 (H) 01/12/2022  ? ALT 13 01/12/2022  ? AST 16 01/12/2022  ? NA 141 01/12/2022  ? K 4.9 01/12/2022  ? CL 104 01/12/2022  ? CREATININE 1.29 (H) 01/12/2022  ? BUN 18 01/12/2022  ? CO2 23 01/12/2022  ? TSH 3.590 10/06/2020  ? HGBA1C 7.3 (H) 01/12/2022  ? MICROALBUR 80 09/10/2021  ? ? ? ? ?Assessment & Plan:  ? ?Problem List Items  Addressed This Visit   ? ?  ? Cardiovascular and Mediastinum  ? 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  Well controlled.  ?No changes to medicines. Continue Metoprolol 25 mg take 1 tablet daily, [place]

## 2022-01-12 NOTE — Patient Instructions (Signed)
Recommend shingrix vaccines at the pharmacy.  ?

## 2022-01-13 LAB — CBC WITH DIFF/PLATELET
Basophils Absolute: 0.1 10*3/uL (ref 0.0–0.2)
Basos: 1 %
EOS (ABSOLUTE): 0.4 10*3/uL (ref 0.0–0.4)
Eos: 5 %
Hematocrit: 45.8 % (ref 37.5–51.0)
Hemoglobin: 15.5 g/dL (ref 13.0–17.7)
Immature Grans (Abs): 0 10*3/uL (ref 0.0–0.1)
Immature Granulocytes: 0 %
Lymphocytes Absolute: 1.9 10*3/uL (ref 0.7–3.1)
Lymphs: 24 %
MCH: 31.2 pg (ref 26.6–33.0)
MCHC: 33.8 g/dL (ref 31.5–35.7)
MCV: 92 fL (ref 79–97)
Monocytes Absolute: 0.5 10*3/uL (ref 0.1–0.9)
Monocytes: 6 %
Neutrophils Absolute: 5 10*3/uL (ref 1.4–7.0)
Neutrophils: 64 %
Platelets: 149 10*3/uL — ABNORMAL LOW (ref 150–450)
RBC: 4.97 x10E6/uL (ref 4.14–5.80)
RDW: 13.2 % (ref 11.6–15.4)
WBC: 7.8 10*3/uL (ref 3.4–10.8)

## 2022-01-13 LAB — COMPREHENSIVE METABOLIC PANEL
ALT: 13 IU/L (ref 0–44)
AST: 16 IU/L (ref 0–40)
Albumin/Globulin Ratio: 1.6 (ref 1.2–2.2)
Albumin: 4.2 g/dL (ref 3.6–4.6)
Alkaline Phosphatase: 104 IU/L (ref 44–121)
BUN/Creatinine Ratio: 14 (ref 10–24)
BUN: 18 mg/dL (ref 8–27)
Bilirubin Total: 0.5 mg/dL (ref 0.0–1.2)
CO2: 23 mmol/L (ref 20–29)
Calcium: 9.9 mg/dL (ref 8.6–10.2)
Chloride: 104 mmol/L (ref 96–106)
Creatinine, Ser: 1.29 mg/dL — ABNORMAL HIGH (ref 0.76–1.27)
Globulin, Total: 2.6 g/dL (ref 1.5–4.5)
Glucose: 86 mg/dL (ref 70–99)
Potassium: 4.9 mmol/L (ref 3.5–5.2)
Sodium: 141 mmol/L (ref 134–144)
Total Protein: 6.8 g/dL (ref 6.0–8.5)
eGFR: 55 mL/min/{1.73_m2} — ABNORMAL LOW (ref >59)

## 2022-01-13 LAB — LIPID PANEL
Chol/HDL Ratio: 4.5 ratio (ref 0.0–5.0)
Cholesterol, Total: 159 mg/dL (ref 100–199)
HDL: 35 mg/dL — ABNORMAL LOW (ref >39)
LDL Chol Calc (NIH): 106 mg/dL — ABNORMAL HIGH (ref 0–99)
Triglycerides: 98 mg/dL (ref 0–149)
VLDL Cholesterol Cal: 18 mg/dL (ref 5–40)

## 2022-01-13 LAB — MICROALBUMIN / CREATININE URINE RATIO
Creatinine, Urine: 249.5 mg/dL
Microalb/Creat Ratio: 22 mg/g creat (ref 0–29)
Microalbumin, Urine: 55.8 ug/mL

## 2022-01-13 LAB — HEMOGLOBIN A1C
Est. average glucose Bld gHb Est-mCnc: 163 mg/dL
Hgb A1c MFr Bld: 7.3 % — ABNORMAL HIGH (ref 4.8–5.6)

## 2022-01-13 NOTE — Progress Notes (Signed)
Blood count normal except platelets slightly low.  ?Liver function normal.  ?Kidney function abnormal. Rcommend hydrate. ?Cholesterol: LDL not quite at goal. Recommend lipitor 20 mg before bed. Recheck CMP in 6 weeks just to be cautious due to history of increase in LFTs. ?HBA1C: 7.3. Little improved. Continue current medicines. ?Not spilling protein in urine. ?

## 2022-01-14 ENCOUNTER — Other Ambulatory Visit: Payer: Self-pay

## 2022-01-14 MED ORDER — ATORVASTATIN CALCIUM 20 MG PO TABS: 20.0000 mg | ORAL_TABLET | Freq: Every day | ORAL | 3 refills | Status: DC

## 2022-01-22 DIAGNOSIS — H524 Presbyopia: Secondary | ICD-10-CM | POA: Insufficient documentation

## 2022-01-22 NOTE — Assessment & Plan Note (Signed)
Well controlled.  ?No changes to medicines. Continue Metoprolol 25 mg take 1 tablet daily, Telmisartan 80 mg take 1 tablet daily. ?Continue to work on eating a healthy diet and exercise.  ?Labs drawn today.  ? ?

## 2022-01-22 NOTE — Assessment & Plan Note (Addendum)
Control: improved ?Recommend check sugars fasting daily. ?Recommend check feet daily. ?Recommend annual eye exams. ?Medicines: Continue Metformin 1000 mg take 1 tablet BID, Novolog insulin injection 70/30 60 U in am and 40 U before supper, Gabapentin 300 mg 1 capsule twice daily.Continue to work on eating a healthy diet and exercise.  ?Labs drawn today.   ? ?

## 2022-01-22 NOTE — Assessment & Plan Note (Addendum)
Not quite at goal.  ?Restart lipitor 20 mg daily now that the etiology of the elevated transaminases has been fixed.  ?Continue to work on eating a healthy diet and exercise.  ?Labs drawn today.  ?Recheck LFTs in 6 weeks. ? ?

## 2022-01-22 NOTE — Assessment & Plan Note (Signed)
Recommend continue to work on eating healthy diet and exercise.  

## 2022-01-22 NOTE — Assessment & Plan Note (Signed)
Check cbc. ?Platelets improved.  ?

## 2022-02-11 DIAGNOSIS — L821 Other seborrheic keratosis: Secondary | ICD-10-CM | POA: Diagnosis not present

## 2022-02-11 DIAGNOSIS — L578 Other skin changes due to chronic exposure to nonionizing radiation: Secondary | ICD-10-CM | POA: Diagnosis not present

## 2022-02-11 DIAGNOSIS — L570 Actinic keratosis: Secondary | ICD-10-CM | POA: Diagnosis not present

## 2022-02-17 ENCOUNTER — Other Ambulatory Visit: Payer: Self-pay

## 2022-02-17 DIAGNOSIS — E1142 Type 2 diabetes mellitus with diabetic polyneuropathy: Secondary | ICD-10-CM

## 2022-02-17 MED ORDER — INSULIN PEN NEEDLE 30G X 8 MM MISC: 1.0000 | Status: AC | PRN

## 2022-02-24 ENCOUNTER — Other Ambulatory Visit: Payer: Self-pay

## 2022-02-24 DIAGNOSIS — E1142 Type 2 diabetes mellitus with diabetic polyneuropathy: Secondary | ICD-10-CM

## 2022-02-24 MED ORDER — INSULIN PEN NEEDLE 30G X 8 MM MISC: 1.0000 | 2 refills | Status: AC | PRN

## 2022-03-10 LAB — HM DIABETES EYE EXAM

## 2022-05-18 ENCOUNTER — Ambulatory Visit (INDEPENDENT_AMBULATORY_CARE_PROVIDER_SITE_OTHER): Payer: PPO | Admitting: Family Medicine

## 2022-05-18 ENCOUNTER — Encounter: Payer: Self-pay | Admitting: Family Medicine

## 2022-05-18 VITALS — BP 128/66 | HR 84 | Temp 97.2°F | Resp 16 | Ht 73.0 in | Wt 265.0 lb

## 2022-05-18 DIAGNOSIS — D696 Thrombocytopenia, unspecified: Secondary | ICD-10-CM

## 2022-05-18 DIAGNOSIS — E1142 Type 2 diabetes mellitus with diabetic polyneuropathy: Secondary | ICD-10-CM | POA: Diagnosis not present

## 2022-05-18 DIAGNOSIS — Z794 Long term (current) use of insulin: Secondary | ICD-10-CM

## 2022-05-18 DIAGNOSIS — Z6834 Body mass index (BMI) 34.0-34.9, adult: Secondary | ICD-10-CM

## 2022-05-18 DIAGNOSIS — E782 Mixed hyperlipidemia: Secondary | ICD-10-CM | POA: Diagnosis not present

## 2022-05-18 DIAGNOSIS — E538 Deficiency of other specified B group vitamins: Secondary | ICD-10-CM

## 2022-05-18 DIAGNOSIS — I119 Hypertensive heart disease without heart failure: Secondary | ICD-10-CM | POA: Diagnosis not present

## 2022-05-18 DIAGNOSIS — R2689 Other abnormalities of gait and mobility: Secondary | ICD-10-CM | POA: Diagnosis not present

## 2022-05-18 DIAGNOSIS — E1165 Type 2 diabetes mellitus with hyperglycemia: Secondary | ICD-10-CM | POA: Diagnosis not present

## 2022-05-18 DIAGNOSIS — E6609 Other obesity due to excess calories: Secondary | ICD-10-CM | POA: Diagnosis not present

## 2022-05-18 NOTE — Progress Notes (Signed)
8  Subjective:  Patient ID: Andre Shaffer, male    DOB: 07-20-1941  Age: 81 y.o. MRN: [id number]  Chief Complaint  Patient presents with   Diabetes   Hyperlipidemia    HPI Diabetes:  Complications: neuropathy.  Glucose checking: Checks sugars fasting daily Glucose logs: 113 - 195.  Hypoglycemia: no low sugars. Most recent A1C: 7.3 Current medications: Metformin 1000 mg take 1 tablet BID, Novolog insulin injection 70/30 60 U in am and 40 U before supper, Gabapentin 300 mg 1 capsule twice daily. Last Eye Exam: 08/09/2021 - The patient seen the Eye doctor at the [place] [facility] around 11/22, and they stated that the patient has a wrinkle in his right eye and it would be monitored closely. He was instructed to call them back if he had experienced any significant eyesight changes. Following up in 6 months. He returned in February and May 2023. Wrinkle in right eye resolved.  "Knot" below left eye is still present. Patient has an appointment with different eye doctor to reevaluate left eye.  Foot checks: daily.   Hyperlipidemia: Current medications: Lipitor 20 mg daily   Hypertension/CAD: Current medications: Metoprolol XL 25 mg take 1 tablet daily, Telmisartan 80 mg take 1 tablet daily. BP is good. Normally at home 110-120/60s.   B12 deficiency: Takes B12 2500 mcg once daily. Diet: healthy.  Exercise: no   With extended walking, has dyspnea on exertion. No chest pain. Going on for several months.  Current Outpatient Medications on File Prior to Visit  Medication Sig Dispense Refill   aspirin EC 325 MG tablet Take 325 mg by mouth daily.     atorvastatin (LIPITOR) 20 MG tablet Take 1 tablet (20 mg total) by mouth daily. 90 tablet 3   Cyanocobalamin 1000 MCG/ML LIQD Take 2,500 mcg by mouth See admin instructions. Place 0.5 dropperful (2,500 mcg) sublingually daily     fluticasone (FLONASE) 50 MCG/ACT nasal spray Place 2 sprays into both nostrils daily. 16 g 6   gabapentin (NEURONTIN) 300 MG capsule  Take 1 capsule (300 mg total) by mouth 2 (two) times daily. 180 capsule 3   ibuprofen (ADVIL,MOTRIN) 200 MG tablet Take 200 mg by mouth every 8 (eight) hours as needed for moderate pain or mild pain.     Insulin Pen Needle (NOVOFINE) 30G X 8 MM MISC Inject 10 each into the skin as needed. E11.42 100 each 2   Lancets (ONETOUCH ULTRASOFT) lancets Use as instructed, Check blood glucose twice daily. 100 each 12   metFORMIN (GLUCOPHAGE) 1000 MG tablet Take 1 tablet (1,000 mg total) by mouth 2 (two) times daily. 180 tablet 1   metoprolol succinate (TOPROL-XL) 25 MG 24 hr tablet TAKE ONE (1) TABLET ONCE DAILY 90 tablet 3   telmisartan (MICARDIS) 80 MG tablet TAKE ONE (1) TABLET BY MOUTH ONCE DAILY 90 tablet 3   Blood Glucose Monitoring Suppl (ONETOUCH VERIO) w/Device KIT Use as instructed. Check blood glucose twice daily. E11.42 1 kit 0   glucose blood test strip Use as instructed, Check blood glucose twice daily. 100 each 12   glucose blood test strip      Current Facility-Administered Medications on File Prior to Visit  Medication Dose Route Frequency Provider Last Rate Last Admin   Insulin Pen Needle (NOVOFINE) 10 each  1 packet Subcutaneous PRN Andre Baba, MD       Past Medical History:  Diagnosis Date   Abnormal liver function    Arthritis    Atherosclerotic heart disease of  native coronary artery without angina pectoris    Diabetes mellitus without complication (HCC)    GERD (gastroesophageal reflux disease)    Hyperlipemia    Hypertension    MI (myocardial infarction) ([place])    SCC (squamous cell carcinoma), arm, left    SCC (squamous cell carcinoma), arm, 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   Past Surgical History:  Procedure Laterality Date   ANKLE SURGERY  93,03   rt and lt    CHOLECYSTECTOMY  10/19/2012   Procedure: LAPAROSCOPIC CHOLECYSTECTOMY WITH INTRAOPERATIVE CHOLANGIOGRAM;  Surgeon: Andre Ok, MD;  Location: [facility];  Service: General;  Laterality: N/A;   COLONOSCOPY      CORONARY ARTERY BYPASS GRAFT  1997   ENDOSCOPIC RETROGRADE CHOLANGIOPANCREATOGRAPHY (ERCP) WITH PROPOFOL N/A 07/16/2021   Procedure: ENDOSCOPIC RETROGRADE CHOLANGIOPANCREATOGRAPHY (ERCP) WITH PROPOFOL;  Surgeon: Andre Banister, MD;  Location: [facility];  Service: Endoscopy;  Laterality: N/A;   REMOVAL OF STONES  07/16/2021   Procedure: REMOVAL OF STONES;  Surgeon: Andre Banister, MD;  Location: [facility];  Service: Endoscopy;;   SKIN CANCER EXCISION  12/2020   Squamous Cell Cancer   SPHINCTEROTOMY  07/16/2021   Procedure: SPHINCTEROTOMY;  Surgeon: Andre Banister, MD;  Location: [facility];  Service: Endoscopy;;   TIBIA FRACTURE SURGERY      History reviewed. No pertinent family history. Social History   Socioeconomic History   Marital status: Married    Spouse name: Not on file   Number of children: Not on file   Years of education: Not on file   Highest education level: Not on file  Occupational History   Not on file  Tobacco Use   Smoking status: Former    Packs/day: 1.00    Years: 20.00    Total pack years: 20.00    Types: Cigarettes    Quit date: 10/19/1975    Years since quitting: 46.6   Smokeless tobacco: Never  Substance and Sexual Activity   Alcohol use: No   Drug use: No   Sexual activity: Not on file  Other Topics Concern   Not on file  Social History Narrative   Not on file   Social Determinants of Health   Financial Resource Strain: Not on file  Food Insecurity: Not on file  Transportation Needs: Not on file  Physical Activity: Not on file  Stress: Not on file  Social Connections: Not on file    Review of Systems  Constitutional:  Negative for chills and fever.  HENT:  Negative for congestion, rhinorrhea and sore throat.   Respiratory:  Positive for shortness of breath. Negative for cough.   Cardiovascular:  Negative for chest pain and palpitations.  Gastrointestinal:  Positive for constipation. Negative for abdominal pain, diarrhea,  nausea and vomiting.  Endocrine: Negative for polydipsia, polyphagia and polyuria.  Genitourinary:  Negative for dysuria and urgency.  Musculoskeletal:  Positive for arthralgias and back pain. Negative for myalgias.  Neurological:  Negative for dizziness and headaches.  Psychiatric/Behavioral:  Negative for dysphoric mood. The patient is not nervous/anxious.    Objective:  BP 128/66   Pulse 84   Temp (!) 97.2 F (36.2 C)   Resp 16   Ht $R'6\' 1"'cn$  (1.854 m)   Wt 265 lb (120.2 kg)   BMI 34.96 kg/m      05/18/2022    9:24 AM 01/12/2022    9:25 AM 09/22/2021   10:56 AM  BP/Weight  Systolic BP 778 242 353  Diastolic BP 66 60 64  Wt. (Lbs) 265 262 264  BMI 34.96 kg/m2 34.57 kg/m2 34.83 kg/m2    Physical Exam Vitals reviewed.  Constitutional:      Appearance: Normal appearance.  Neck:     Vascular: No carotid bruit.  Cardiovascular:     Rate and Rhythm: Normal rate and regular rhythm.     Pulses: Normal pulses.     Heart sounds: Normal heart sounds.  Pulmonary:     Effort: Pulmonary effort is normal.     Breath sounds: Normal breath sounds. No wheezing, rhonchi or rales.  Abdominal:     General: Bowel sounds are normal.     Palpations: Abdomen is soft.     Tenderness: There is no abdominal tenderness.  Neurological:     Mental Status: He is alert.  Psychiatric:        Mood and Affect: Mood normal.        Behavior: Behavior normal.     Diabetic Foot Exam - Simple   Simple Foot Form Diabetic Foot exam was performed with the following findings: Yes 05/18/2022 10:08 AM  Visual Inspection No deformities, no ulcerations, no other skin breakdown bilaterally: Yes Sensation Testing See comments: Yes Pulse Check Posterior Tibialis and Dorsalis pulse intact bilaterally: Yes Comments Decreased sensation BL feet. Stable.       Lab Results  Component Value Date   WBC 7.6 05/18/2022   HGB 13.9 05/18/2022   HCT 41.5 05/18/2022   PLT 127 (L) 05/18/2022   GLUCOSE 126 (H)  05/18/2022   CHOL 91 (L) 05/18/2022   TRIG 63 05/18/2022   HDL 31 (L) 05/18/2022   LDLCALC 46 05/18/2022   ALT 10 05/18/2022   AST 16 05/18/2022   NA 142 05/18/2022   K 5.1 05/18/2022   CL 105 05/18/2022   CREATININE 1.15 05/18/2022   BUN 16 05/18/2022   CO2 24 05/18/2022   TSH 4.100 05/18/2022   HGBA1C 7.5 (H) 05/18/2022   MICROALBUR 80 09/10/2021      Assessment & Plan:   Problem List Items Addressed This Visit       Cardiovascular and Mediastinum   Hypertensive heart disease, benign, 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  No changes to medicines.  Continue to work on eating a healthy diet and exercise.  Labs drawn today.          Endocrine   Diabetic polyneuropathy associated with type 2 diabetes mellitus ([place])    Control: good Recommend check sugars fasting daily. Recommend check feet daily. Recommend annual eye exams. Medicines: Continue Metformin 1000 mg take 1 tablet BID, Novolog insulin injection 70/30 60 U in am and 40 U before supper, Gabapentin 300 mg 1 capsule twice daily. Continue to work on eating a healthy diet and exercise.  Labs drawn today.         Relevant Orders   Hemoglobin A1c (Completed)   Type 2 diabetes mellitus with hyperglycemia, with long-term current use of insulin (HCC)    Control: good Recommend check sugars fasting daily. Recommend check feet daily. Recommend annual eye exams. Medicines: Continue Metformin 1000 mg take 1 tablet BID, Novolog insulin injection 70/30 60 U in am and 40 U before supper, Gabapentin 300 mg 1 capsule twice daily.C ontinue to work on eating a healthy diet and exercise.  Labs drawn today.           Hematopoietic and Hemostatic   Thrombocytopenia (HCC) - Primary    Check cbc.  Other   Mixed hyperlipidemia    Well controlled.  No changes to medicines. Continue lipitor 20 mg daily.  Continue to work on eating a healthy diet and exercise.  Labs drawn today.        Relevant Orders   CBC  with Differential/Platelet (Completed)   Comprehensive metabolic panel (Completed)   Lipid panel (Completed)   Class 1 obesity due to excess calories with serious comorbidity and body mass index (BMI) of 34.0 to 34.9 in adult   Imbalance    Recommend balance exercises.       Relevant Orders   TSH (Completed)   B12 deficiency    Continue b12 2500 mcg once daily      Relevant Orders   Vitamin B12 (Completed)  .  No orders of the defined types were placed in this encounter.   Orders Placed This Encounter  Procedures   CBC with Differential/Platelet   Comprehensive metabolic panel   Lipid panel   TSH   Hemoglobin A1c   Vitamin B12   Cardiovascular Risk Assessment     Follow-up: Return in about 3 months (around 08/18/2022) for chronic fasting.  An After Visit Summary was printed and given to the patient.  Rochel Brome, MD [facility] [phone]

## 2022-05-19 ENCOUNTER — Other Ambulatory Visit: Payer: Self-pay

## 2022-05-19 LAB — COMPREHENSIVE METABOLIC PANEL
ALT: 10 IU/L (ref 0–44)
AST: 16 IU/L (ref 0–40)
Albumin/Globulin Ratio: 1.6 (ref 1.2–2.2)
Albumin: 3.9 g/dL (ref 3.7–4.7)
Alkaline Phosphatase: 82 IU/L (ref 44–121)
BUN/Creatinine Ratio: 14 (ref 10–24)
BUN: 16 mg/dL (ref 8–27)
Bilirubin Total: 0.7 mg/dL (ref 0.0–1.2)
CO2: 24 mmol/L (ref 20–29)
Calcium: 9.5 mg/dL (ref 8.6–10.2)
Chloride: 105 mmol/L (ref 96–106)
Creatinine, Ser: 1.15 mg/dL (ref 0.76–1.27)
Globulin, Total: 2.5 g/dL (ref 1.5–4.5)
Glucose: 126 mg/dL — ABNORMAL HIGH (ref 70–99)
Potassium: 5.1 mmol/L (ref 3.5–5.2)
Sodium: 142 mmol/L (ref 134–144)
Total Protein: 6.4 g/dL (ref 6.0–8.5)
eGFR: 64 mL/min/{1.73_m2} (ref >59)

## 2022-05-19 LAB — CBC WITH DIFFERENTIAL/PLATELET
Basophils Absolute: 0.1 10*3/uL (ref 0.0–0.2)
Basos: 1 %
EOS (ABSOLUTE): 0.3 10*3/uL (ref 0.0–0.4)
Eos: 4 %
Hematocrit: 41.5 % (ref 37.5–51.0)
Hemoglobin: 13.9 g/dL (ref 13.0–17.7)
Immature Grans (Abs): 0 10*3/uL (ref 0.0–0.1)
Immature Granulocytes: 0 %
Lymphocytes Absolute: 1.5 10*3/uL (ref 0.7–3.1)
Lymphs: 20 %
MCH: 32.4 pg (ref 26.6–33.0)
MCHC: 33.5 g/dL (ref 31.5–35.7)
MCV: 97 fL (ref 79–97)
Monocytes Absolute: 0.5 10*3/uL (ref 0.1–0.9)
Monocytes: 6 %
Neutrophils Absolute: 5.2 10*3/uL (ref 1.4–7.0)
Neutrophils: 69 %
Platelets: 127 10*3/uL — ABNORMAL LOW (ref 150–450)
RBC: 4.29 x10E6/uL (ref 4.14–5.80)
RDW: 13.7 % (ref 11.6–15.4)
WBC: 7.6 10*3/uL (ref 3.4–10.8)

## 2022-05-19 LAB — LIPID PANEL
Chol/HDL Ratio: 2.9 ratio (ref 0.0–5.0)
Cholesterol, Total: 91 mg/dL — ABNORMAL LOW (ref 100–199)
HDL: 31 mg/dL — ABNORMAL LOW (ref >39)
LDL Chol Calc (NIH): 46 mg/dL (ref 0–99)
Triglycerides: 63 mg/dL (ref 0–149)
VLDL Cholesterol Cal: 14 mg/dL (ref 5–40)

## 2022-05-19 LAB — VITAMIN B12: Vitamin B-12: 300 pg/mL (ref 232–1245)

## 2022-05-19 LAB — HEMOGLOBIN A1C
Est. average glucose Bld gHb Est-mCnc: 169 mg/dL
Hgb A1c MFr Bld: 7.5 % — ABNORMAL HIGH (ref 4.8–5.6)

## 2022-05-19 LAB — TSH: TSH: 4.1 u[IU]/mL (ref 0.450–4.500)

## 2022-05-19 MED ORDER — INSULIN ASPART PROT & ASPART (70-30 MIX) 100 UNIT/ML ~~LOC~~ SUSP: 40.0000 [IU] | SUBCUTANEOUS | 0 refills | Status: AC

## 2022-05-23 DIAGNOSIS — R2689 Other abnormalities of gait and mobility: Secondary | ICD-10-CM | POA: Insufficient documentation

## 2022-05-23 DIAGNOSIS — E6609 Other obesity due to excess calories: Secondary | ICD-10-CM | POA: Insufficient documentation

## 2022-05-23 DIAGNOSIS — E538 Deficiency of other specified B group vitamins: Secondary | ICD-10-CM | POA: Insufficient documentation

## 2022-05-23 NOTE — Assessment & Plan Note (Addendum)
Control: good Recommend check sugars fasting daily. Recommend check feet daily. Recommend annual eye exams. Medicines: Continue Metformin 1000 mg take 1 tablet BID, Novolog insulin injection 70/30 60 U in am and 40 U before supper, Gabapentin 300 mg 1 capsule twice daily. Continue to work on eating a healthy diet and exercise.  Labs drawn today.

## 2022-05-23 NOTE — Assessment & Plan Note (Addendum)
Control: good Recommend check sugars fasting daily. Recommend check feet daily. Recommend annual eye exams. Medicines: Continue Metformin 1000 mg take 1 tablet BID, Novolog insulin injection 70/30 60 U in am and 40 U before supper, Gabapentin 300 mg 1 capsule twice daily.C ontinue to work on eating a healthy diet and exercise.  Labs drawn today.

## 2022-05-23 NOTE — Assessment & Plan Note (Signed)
Recommend balance exercises.  

## 2022-05-23 NOTE — Assessment & Plan Note (Signed)
Well controlled.  ?No changes to medicines.  ?Continue to work on eating a healthy diet and exercise.  ?Labs drawn today.  ?

## 2022-05-23 NOTE — Assessment & Plan Note (Signed)
Check cbc 

## 2022-05-23 NOTE — Assessment & Plan Note (Signed)
Well controlled.  No changes to medicines. Continue lipitor 20 mg daily.  Continue to work on eating a healthy diet and exercise.  Labs drawn today.   

## 2022-05-23 NOTE — Assessment & Plan Note (Signed)
Continue b12 2500 mcg once daily

## 2022-05-24 ENCOUNTER — Ambulatory Visit: Payer: PPO

## 2022-05-24 DIAGNOSIS — H029 Unspecified disorder of eyelid: Secondary | ICD-10-CM | POA: Diagnosis not present

## 2022-06-15 ENCOUNTER — Other Ambulatory Visit: Payer: Self-pay | Admitting: Family Medicine

## 2022-07-15 DIAGNOSIS — L821 Other seborrheic keratosis: Secondary | ICD-10-CM | POA: Diagnosis not present

## 2022-07-15 DIAGNOSIS — L570 Actinic keratosis: Secondary | ICD-10-CM | POA: Diagnosis not present

## 2022-07-15 DIAGNOSIS — L578 Other skin changes due to chronic exposure to nonionizing radiation: Secondary | ICD-10-CM | POA: Diagnosis not present

## 2022-08-10 DIAGNOSIS — L570 Actinic keratosis: Secondary | ICD-10-CM | POA: Diagnosis not present

## 2022-08-10 DIAGNOSIS — D485 Neoplasm of uncertain behavior of skin: Secondary | ICD-10-CM | POA: Diagnosis not present

## 2022-08-10 DIAGNOSIS — D04122 Carcinoma in situ of skin of left lower eyelid, including canthus: Secondary | ICD-10-CM | POA: Diagnosis not present

## 2022-08-10 DIAGNOSIS — H0015 Chalazion left lower eyelid: Secondary | ICD-10-CM | POA: Diagnosis not present

## 2022-08-11 DIAGNOSIS — I252 Old myocardial infarction: Secondary | ICD-10-CM | POA: Diagnosis not present

## 2022-08-11 DIAGNOSIS — Z955 Presence of coronary angioplasty implant and graft: Secondary | ICD-10-CM | POA: Diagnosis not present

## 2022-08-11 DIAGNOSIS — E1165 Type 2 diabetes mellitus with hyperglycemia: Secondary | ICD-10-CM | POA: Diagnosis not present

## 2022-08-11 DIAGNOSIS — Z794 Long term (current) use of insulin: Secondary | ICD-10-CM | POA: Diagnosis not present

## 2022-08-11 DIAGNOSIS — Z880 Allergy status to penicillin: Secondary | ICD-10-CM | POA: Diagnosis not present

## 2022-08-11 DIAGNOSIS — I214 Non-ST elevation (NSTEMI) myocardial infarction: Secondary | ICD-10-CM | POA: Diagnosis not present

## 2022-08-11 DIAGNOSIS — J90 Pleural effusion, not elsewhere classified: Secondary | ICD-10-CM | POA: Diagnosis not present

## 2022-08-11 DIAGNOSIS — I25810 Atherosclerosis of coronary artery bypass graft(s) without angina pectoris: Secondary | ICD-10-CM | POA: Diagnosis not present

## 2022-08-11 DIAGNOSIS — D6869 Other thrombophilia: Secondary | ICD-10-CM | POA: Diagnosis not present

## 2022-08-11 DIAGNOSIS — Z79899 Other long term (current) drug therapy: Secondary | ICD-10-CM | POA: Diagnosis not present

## 2022-08-11 DIAGNOSIS — Z888 Allergy status to other drugs, medicaments and biological substances status: Secondary | ICD-10-CM | POA: Diagnosis not present

## 2022-08-11 DIAGNOSIS — K219 Gastro-esophageal reflux disease without esophagitis: Secondary | ICD-10-CM | POA: Diagnosis not present

## 2022-08-11 DIAGNOSIS — Z7984 Long term (current) use of oral hypoglycemic drugs: Secondary | ICD-10-CM | POA: Diagnosis not present

## 2022-08-11 DIAGNOSIS — I4891 Unspecified atrial fibrillation: Secondary | ICD-10-CM | POA: Diagnosis not present

## 2022-08-11 DIAGNOSIS — Z6834 Body mass index (BMI) 34.0-34.9, adult: Secondary | ICD-10-CM | POA: Diagnosis not present

## 2022-08-11 DIAGNOSIS — Z887 Allergy status to serum and vaccine status: Secondary | ICD-10-CM | POA: Diagnosis not present

## 2022-08-11 DIAGNOSIS — I361 Nonrheumatic tricuspid (valve) insufficiency: Secondary | ICD-10-CM | POA: Diagnosis not present

## 2022-08-11 DIAGNOSIS — R079 Chest pain, unspecified: Secondary | ICD-10-CM | POA: Diagnosis not present

## 2022-08-11 DIAGNOSIS — E1169 Type 2 diabetes mellitus with other specified complication: Secondary | ICD-10-CM | POA: Diagnosis not present

## 2022-08-11 DIAGNOSIS — E669 Obesity, unspecified: Secondary | ICD-10-CM | POA: Diagnosis not present

## 2022-08-11 DIAGNOSIS — E119 Type 2 diabetes mellitus without complications: Secondary | ICD-10-CM | POA: Diagnosis not present

## 2022-08-11 DIAGNOSIS — R0789 Other chest pain: Secondary | ICD-10-CM | POA: Diagnosis not present

## 2022-08-11 DIAGNOSIS — Z951 Presence of aortocoronary bypass graft: Secondary | ICD-10-CM | POA: Diagnosis not present

## 2022-08-11 DIAGNOSIS — I10 Essential (primary) hypertension: Secondary | ICD-10-CM | POA: Diagnosis present

## 2022-08-11 DIAGNOSIS — D696 Thrombocytopenia, unspecified: Secondary | ICD-10-CM | POA: Diagnosis not present

## 2022-08-11 DIAGNOSIS — I2510 Atherosclerotic heart disease of native coronary artery without angina pectoris: Secondary | ICD-10-CM | POA: Diagnosis not present

## 2022-08-11 DIAGNOSIS — I25119 Atherosclerotic heart disease of native coronary artery with unspecified angina pectoris: Secondary | ICD-10-CM | POA: Diagnosis not present

## 2022-08-11 DIAGNOSIS — R231 Pallor: Secondary | ICD-10-CM | POA: Diagnosis not present

## 2022-08-11 DIAGNOSIS — E785 Hyperlipidemia, unspecified: Secondary | ICD-10-CM | POA: Diagnosis not present

## 2022-08-11 DIAGNOSIS — E7800 Pure hypercholesterolemia, unspecified: Secondary | ICD-10-CM | POA: Diagnosis not present

## 2022-08-11 DIAGNOSIS — Z7982 Long term (current) use of aspirin: Secondary | ICD-10-CM | POA: Diagnosis not present

## 2022-08-11 DIAGNOSIS — Z87891 Personal history of nicotine dependence: Secondary | ICD-10-CM | POA: Diagnosis not present

## 2022-08-12 ENCOUNTER — Encounter (HOSPITAL_COMMUNITY)
Admission: RE | Disposition: A | Discharge status: Home / Self Care | Payer: Self-pay | Source: Home / Self Care | Attending: Cardiovascular Disease

## 2022-08-12 ENCOUNTER — Inpatient Hospital Stay (HOSPITAL_COMMUNITY)
Admission: RE | Admit: 2022-08-12 | Discharge: 2022-08-13 | DRG: 322 | Disposition: A | Discharge status: Home / Self Care | Payer: PPO | Attending: Cardiovascular Disease | Admitting: Cardiovascular Disease

## 2022-08-12 DIAGNOSIS — I4891 Unspecified atrial fibrillation: Secondary | ICD-10-CM | POA: Diagnosis not present

## 2022-08-12 DIAGNOSIS — E119 Type 2 diabetes mellitus without complications: Secondary | ICD-10-CM | POA: Diagnosis present

## 2022-08-12 DIAGNOSIS — D6869 Other thrombophilia: Secondary | ICD-10-CM | POA: Diagnosis present

## 2022-08-12 DIAGNOSIS — I2510 Atherosclerotic heart disease of native coronary artery without angina pectoris: Secondary | ICD-10-CM | POA: Diagnosis not present

## 2022-08-12 DIAGNOSIS — E1142 Type 2 diabetes mellitus with diabetic polyneuropathy: Secondary | ICD-10-CM

## 2022-08-12 DIAGNOSIS — E785 Hyperlipidemia, unspecified: Secondary | ICD-10-CM | POA: Diagnosis present

## 2022-08-12 DIAGNOSIS — I25810 Atherosclerosis of coronary artery bypass graft(s) without angina pectoris: Secondary | ICD-10-CM | POA: Diagnosis not present

## 2022-08-12 DIAGNOSIS — I252 Old myocardial infarction: Secondary | ICD-10-CM

## 2022-08-12 DIAGNOSIS — I10 Essential (primary) hypertension: Secondary | ICD-10-CM | POA: Diagnosis not present

## 2022-08-12 DIAGNOSIS — Z79899 Other long term (current) drug therapy: Secondary | ICD-10-CM | POA: Diagnosis not present

## 2022-08-12 DIAGNOSIS — Z7984 Long term (current) use of oral hypoglycemic drugs: Secondary | ICD-10-CM

## 2022-08-12 DIAGNOSIS — Z951 Presence of aortocoronary bypass graft: Secondary | ICD-10-CM | POA: Diagnosis not present

## 2022-08-12 DIAGNOSIS — Z87891 Personal history of nicotine dependence: Secondary | ICD-10-CM

## 2022-08-12 DIAGNOSIS — I361 Nonrheumatic tricuspid (valve) insufficiency: Secondary | ICD-10-CM | POA: Diagnosis not present

## 2022-08-12 DIAGNOSIS — K219 Gastro-esophageal reflux disease without esophagitis: Secondary | ICD-10-CM | POA: Diagnosis present

## 2022-08-12 DIAGNOSIS — I214 Non-ST elevation (NSTEMI) myocardial infarction: Secondary | ICD-10-CM | POA: Diagnosis not present

## 2022-08-12 DIAGNOSIS — Z794 Long term (current) use of insulin: Secondary | ICD-10-CM

## 2022-08-12 DIAGNOSIS — Z7982 Long term (current) use of aspirin: Secondary | ICD-10-CM | POA: Diagnosis not present

## 2022-08-12 DIAGNOSIS — Z955 Presence of coronary angioplasty implant and graft: Secondary | ICD-10-CM

## 2022-08-12 DIAGNOSIS — R0789 Other chest pain: Secondary | ICD-10-CM | POA: Diagnosis not present

## 2022-08-12 HISTORY — DX: Unspecified atrial fibrillation: I48.91

## 2022-08-12 HISTORY — PX: CORONARY STENT INTERVENTION: CATH118234

## 2022-08-12 HISTORY — PX: LEFT HEART CATH AND CORS/GRAFTS ANGIOGRAPHY: CATH118250

## 2022-08-12 LAB — POCT ACTIVATED CLOTTING TIME: Activated Clotting Time: 323 seconds

## 2022-08-12 LAB — GLUCOSE, CAPILLARY
Glucose-Capillary: 192 mg/dL — ABNORMAL HIGH (ref 70–99)
Glucose-Capillary: 282 mg/dL — ABNORMAL HIGH (ref 70–99)

## 2022-08-12 SURGERY — LEFT HEART CATH AND CORS/GRAFTS ANGIOGRAPHY
Anesthesia: LOCAL

## 2022-08-12 MED ORDER — HEPARIN (PORCINE) IN NACL 1000-0.9 UT/500ML-% IV SOLN
INTRAVENOUS | Status: AC
  Filled 2022-08-12: qty 1000

## 2022-08-12 MED ORDER — SODIUM CHLORIDE 0.9 % IV SOLN: INTRAVENOUS | Status: AC

## 2022-08-12 MED ORDER — HEPARIN SODIUM (PORCINE) 1000 UNIT/ML IJ SOLN
INTRAMUSCULAR | Status: AC
  Filled 2022-08-12: qty 10

## 2022-08-12 MED ORDER — ASPIRIN 81 MG PO CHEW
81.0000 mg | CHEWABLE_TABLET | Freq: Every day | ORAL | Status: DC
  Administered 2022-08-13: 81 mg via ORAL
  Filled 2022-08-12: qty 1

## 2022-08-12 MED ORDER — METOPROLOL SUCCINATE ER 25 MG PO TB24
25.0000 mg | ORAL_TABLET | Freq: Every day | ORAL | Status: DC
  Administered 2022-08-13: 25 mg via ORAL
  Filled 2022-08-12: qty 1

## 2022-08-12 MED ORDER — MIDAZOLAM HCL 2 MG/2ML IJ SOLN
INTRAMUSCULAR | Status: DC | PRN
  Administered 2022-08-12 (×2): 1 mg via INTRAVENOUS

## 2022-08-12 MED ORDER — LIDOCAINE HCL (PF) 1 % IJ SOLN
INTRAMUSCULAR | Status: DC | PRN
  Administered 2022-08-12: 2 mL via INTRADERMAL
  Administered 2022-08-12: 5 mL via INTRADERMAL

## 2022-08-12 MED ORDER — CLOPIDOGREL BISULFATE 75 MG PO TABS
75.0000 mg | ORAL_TABLET | Freq: Every day | ORAL | Status: DC
  Administered 2022-08-13: 75 mg via ORAL
  Filled 2022-08-12: qty 1

## 2022-08-12 MED ORDER — ATORVASTATIN CALCIUM 10 MG PO TABS
20.0000 mg | ORAL_TABLET | Freq: Every day | ORAL | Status: DC
  Administered 2022-08-12: 20 mg via ORAL
  Filled 2022-08-12: qty 2

## 2022-08-12 MED ORDER — IRBESARTAN 75 MG PO TABS
75.0000 mg | ORAL_TABLET | Freq: Every day | ORAL | Status: DC
  Administered 2022-08-13: 75 mg via ORAL
  Filled 2022-08-12: qty 1

## 2022-08-12 MED ORDER — FENTANYL CITRATE (PF) 100 MCG/2ML IJ SOLN
INTRAMUSCULAR | Status: DC | PRN
  Administered 2022-08-12 (×2): 25 ug via INTRAVENOUS

## 2022-08-12 MED ORDER — CLOPIDOGREL BISULFATE 300 MG PO TABS
ORAL_TABLET | ORAL | Status: AC
  Filled 2022-08-12: qty 2

## 2022-08-12 MED ORDER — SODIUM CHLORIDE 0.9% FLUSH: 3.0000 mL | INTRAVENOUS | Status: DC | PRN

## 2022-08-12 MED ORDER — INSULIN ASPART 100 UNIT/ML IJ SOLN
0.0000 [IU] | Freq: Three times a day (TID) | INTRAMUSCULAR | Status: DC
  Administered 2022-08-13: 5 [IU] via SUBCUTANEOUS
  Administered 2022-08-13: 8 [IU] via SUBCUTANEOUS

## 2022-08-12 MED ORDER — VERAPAMIL HCL 2.5 MG/ML IV SOLN
INTRAVENOUS | Status: AC
  Filled 2022-08-12: qty 2

## 2022-08-12 MED ORDER — HEPARIN (PORCINE) IN NACL 1000-0.9 UT/500ML-% IV SOLN
INTRAVENOUS | Status: DC | PRN
  Administered 2022-08-12 (×2): 500 mL

## 2022-08-12 MED ORDER — INSULIN ASPART PROT & ASPART (70-30 MIX) 100 UNIT/ML ~~LOC~~ SUSP: 25.0000 [IU] | Freq: Every day | SUBCUTANEOUS | Status: DC

## 2022-08-12 MED ORDER — VERAPAMIL HCL 2.5 MG/ML IV SOLN
INTRAVENOUS | Status: DC | PRN
  Administered 2022-08-12: 10 mL via INTRA_ARTERIAL

## 2022-08-12 MED ORDER — FENTANYL CITRATE (PF) 100 MCG/2ML IJ SOLN
INTRAMUSCULAR | Status: AC
  Filled 2022-08-12: qty 2

## 2022-08-12 MED ORDER — HEPARIN SODIUM (PORCINE) 1000 UNIT/ML IJ SOLN
INTRAMUSCULAR | Status: DC | PRN
  Administered 2022-08-12 (×2): 6000 [IU] via INTRAVENOUS

## 2022-08-12 MED ORDER — SODIUM CHLORIDE 0.9% FLUSH
3.0000 mL | Freq: Two times a day (BID) | INTRAVENOUS | Status: DC
  Administered 2022-08-12 – 2022-08-13 (×2): 3 mL via INTRAVENOUS

## 2022-08-12 MED ORDER — INSULIN ASPART PROT & ASPART (70-30 MIX) 100 UNIT/ML ~~LOC~~ SUSP
40.0000 [IU] | Freq: Every day | SUBCUTANEOUS | Status: DC
  Administered 2022-08-13: 40 [IU] via SUBCUTANEOUS
  Filled 2022-08-12: qty 10

## 2022-08-12 MED ORDER — SODIUM CHLORIDE 0.9 % IV SOLN: 250.0000 mL | INTRAVENOUS | Status: DC | PRN

## 2022-08-12 MED ORDER — MIDAZOLAM HCL 2 MG/2ML IJ SOLN
INTRAMUSCULAR | Status: AC
  Filled 2022-08-12: qty 2

## 2022-08-12 MED ORDER — HYDRALAZINE HCL 20 MG/ML IJ SOLN: 10.0000 mg | INTRAMUSCULAR | Status: AC | PRN

## 2022-08-12 MED ORDER — LABETALOL HCL 5 MG/ML IV SOLN: 10.0000 mg | INTRAVENOUS | Status: AC | PRN

## 2022-08-12 MED ORDER — GABAPENTIN 300 MG PO CAPS
300.0000 mg | ORAL_CAPSULE | Freq: Two times a day (BID) | ORAL | Status: DC
  Administered 2022-08-12: 300 mg via ORAL
  Filled 2022-08-12 (×2): qty 1

## 2022-08-12 MED ORDER — ACETAMINOPHEN 325 MG PO TABS: 650.0000 mg | ORAL_TABLET | ORAL | Status: DC | PRN

## 2022-08-12 MED ORDER — ONDANSETRON HCL 4 MG/2ML IJ SOLN: 4.0000 mg | Freq: Four times a day (QID) | INTRAMUSCULAR | Status: DC | PRN

## 2022-08-12 MED ORDER — INSULIN ASPART 100 UNIT/ML IJ SOLN
0.0000 [IU] | Freq: Every day | INTRAMUSCULAR | Status: DC
  Administered 2022-08-12: 3 [IU] via SUBCUTANEOUS

## 2022-08-12 MED ORDER — IOHEXOL 350 MG/ML SOLN
INTRAVENOUS | Status: DC | PRN
  Administered 2022-08-12: 180 mL

## 2022-08-12 MED ORDER — CLOPIDOGREL BISULFATE 300 MG PO TABS
ORAL_TABLET | ORAL | Status: DC | PRN
  Administered 2022-08-12: 600 mg via ORAL

## 2022-08-12 MED ORDER — LIDOCAINE HCL (PF) 1 % IJ SOLN
INTRAMUSCULAR | Status: AC
  Filled 2022-08-12: qty 30

## 2022-08-12 MED ORDER — FLUTICASONE PROPIONATE 50 MCG/ACT NA SUSP
2.0000 | Freq: Every day | NASAL | Status: DC
  Filled 2022-08-12 (×2): qty 16

## 2022-08-12 SURGICAL SUPPLY — 28 items
BAG SNAP BAND KOVER 36X36 (MISCELLANEOUS) IMPLANT
BALLN SAPPHIRE 2.5X12 (BALLOONS) ×1
BALLOON SAPPHIRE 2.5X12 (BALLOONS) IMPLANT
CATH INFINITI 5 FR AR1 MOD (CATHETERS) IMPLANT
CATH INFINITI 5FR AL1 (CATHETERS) IMPLANT
CATH INFINITI 5FR MULTPACK ANG (CATHETERS) IMPLANT
CATH INFINITI 6F AL1 (CATHETERS) IMPLANT
CATH LAUNCHER 6FR AL1 (CATHETERS) IMPLANT
CATHETER LAUNCHER 6FR AL1 (CATHETERS) ×1
CLOSURE PERCLOSE PROSTYLE (VASCULAR PRODUCTS) IMPLANT
DEVICE RAD COMP TR BAND LRG (VASCULAR PRODUCTS) IMPLANT
GLIDESHEATH SLEND SS 6F .021 (SHEATH) IMPLANT
GUIDEWIRE INQWIRE 1.5J.035X260 (WIRE) IMPLANT
INQWIRE 1.5J .035X260CM (WIRE) ×1
KIT ENCORE 26 ADVANTAGE (KITS) IMPLANT
KIT HEART LEFT (KITS) ×1 IMPLANT
KIT MICROPUNCTURE NIT STIFF (SHEATH) IMPLANT
PACK CARDIAC CATHETERIZATION (CUSTOM PROCEDURE TRAY) ×1 IMPLANT
SHEATH PINNACLE 5F 10CM (SHEATH) IMPLANT
SHEATH PINNACLE 6F 10CM (SHEATH) IMPLANT
SHEATH PROBE COVER 6X72 (BAG) IMPLANT
STENT SYNERGY XD 3.0X16 (Permanent Stent) IMPLANT
SYNERGY XD 3.0X16 (Permanent Stent) ×1 IMPLANT
SYR MEDRAD MARK 7 150ML (SYRINGE) ×1 IMPLANT
TRANSDUCER W/STOPCOCK (MISCELLANEOUS) ×1 IMPLANT
TUBING CIL FLEX 10 FLL-RA (TUBING) ×1 IMPLANT
WIRE COUGAR XT STRL 190CM (WIRE) IMPLANT
WIRE EMERALD 3MM-J .035X150CM (WIRE) IMPLANT

## 2022-08-12 NOTE — Progress Notes (Signed)
Removed TR band from right radial site. Site is a level 0. Applied gauze and Tegaderm to site. Educated  pt to leave in place for 24 hrs. Pt verbalizes understanding.

## 2022-08-12 NOTE — Progress Notes (Addendum)
Patient is trying to get up, wants to go to his truck; saying he is kidnapped; threatening to hurt me. Oriented to self, time, place and why he is here. Wife called and is at bedside.

## 2022-08-12 NOTE — H&P (Addendum)
Cardiology Admission History and Physical   Patient ID: Andre Shaffer MRN: [id number]; DOB: 22-Dec-1940   Admission date: (Not on file)  PCP:  Rochel Brome, MD   [facility] Cardiologist: Remotely seen by Dr. Johnsie Cancel, lost to follow-up since 2015   Chief Complaint:  Chest pain  Patient Profile:   Andre Shaffer is a 81 y.o. male with CAD s/p CABG x4 in 1997 by Dr. Servando Snare, hypertension, hyperlipidemia and DM2 who is being seen 08/12/2022 for the evaluation of chest pain.  Patient is a NSTEMI transferred from [facility]  Urgent cardiac catheterization required as serial troponin trended up to 22 (on the old scale, which correspond to 22000 on the new scale)  History of Present Illness:   Andre Shaffer is a 81 year old male with past medical history of CAD s/p CABG x4 in 1997 by Dr. Servando Snare, hypertension, hyperlipidemia and DM2.  Myoview obtained on 05/08/2014 showed EF 62%, inferolateral akinesis with fixed inferolateral defect consistent with scar, overall considered low risk study.  Echocardiogram obtained around the same time showed EF 45 to 50%, mildly reduced LV, severe hypokinesis of the inferior myocardium, mild LAE.  Carotid Doppler obtained in July 2016 showed no significant disease his last follow-up with Dr. Johnsie Cancel was in August 2015, he was supposed to follow-up in 6 months, however has been lost to follow-up since.  He presented to [facility] on 08/11/2022 with sudden onset of substernal and diffuse chest tightness with mild nausea and vomiting while eating breakfast.  Initial EKG showed atrial fibrillation, heart rate 73, marked ST abnormality concerning for possible inferior subendocardial injury.  Hemoglobin 13.5, white blood cell count 7.3, platelet 123.  Creatinine 1.1.  Initial troponin 2.01, subsequent troponin trended up to 21.7 --> 22.80.  Urinalysis revealed 1+ urine protein, 4+ urine glucose.  Total cholesterol 99, triglycerides 73, HDL  27, LDL 57.  Hemoglobin A1c 6.9.  Magnesium 1.6.  He was started on IV heparin and nitroglycerin drip.  Prior to ED arrival, patient took 2 doses of 325 mg aspirin at home.  He was also given 1 L of normal saline at home.  Patient was seen in consultation by Dr. Geraldo Pitter on 08/11/2022 who recommended transfer to [facility] on 10/12 for cardiac catheterization.  Per overnight cardiology fellow, patient was chest pain-free this morning.  Given the significant rise in the troponin, patient will be transferred straight to Cath Lab [facility] for urgent cardiac catheterization.  Creatinine this morning was 0.90.  Most recent EKG showed atrial fibrillation, heart rate 78, ST depression in the inferolateral leads. (EKG was hard to see due to grey artifact during fax).   Past Medical History:  Diagnosis Date   Abnormal liver function    Arthritis    Atherosclerotic heart disease of native coronary artery without angina pectoris    Diabetes mellitus without complication (HCC)    GERD (gastroesophageal reflux disease)    Hyperlipemia    Hypertension    MI (myocardial infarction) ([place])    SCC (squamous cell carcinoma), arm, left    SCC (squamous cell carcinoma), arm, 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    Past Surgical History:  Procedure Laterality Date   ANKLE SURGERY  93,03   rt and lt    CHOLECYSTECTOMY  10/19/2012   Procedure: LAPAROSCOPIC CHOLECYSTECTOMY WITH INTRAOPERATIVE CHOLANGIOGRAM;  Surgeon: Ralene Ok, MD;  Location: [facility];  Service: General;  Laterality: N/A;   COLONOSCOPY     CORONARY ARTERY  BYPASS GRAFT  1997   ENDOSCOPIC RETROGRADE CHOLANGIOPANCREATOGRAPHY (ERCP) WITH PROPOFOL N/A 07/16/2021   Procedure: ENDOSCOPIC RETROGRADE CHOLANGIOPANCREATOGRAPHY (ERCP) WITH PROPOFOL;  Surgeon: Milus Banister, MD;  Location: [facility];  Service: Endoscopy;  Laterality: N/A;   REMOVAL OF STONES  07/16/2021   Procedure: REMOVAL OF STONES;  Surgeon: Milus Banister, MD;   Location: [facility];  Service: Endoscopy;;   SKIN CANCER EXCISION  12/2020   Squamous Cell Cancer   SPHINCTEROTOMY  07/16/2021   Procedure: SPHINCTEROTOMY;  Surgeon: Milus Banister, MD;  Location: [facility];  Service: Endoscopy;;   TIBIA FRACTURE SURGERY       Medications Prior to Admission: Prior to Admission medications   Medication Sig Start Date End Date Taking? Authorizing Provider  aspirin EC 325 MG tablet Take 325 mg by mouth daily.    [provider]  atorvastatin (LIPITOR) 20 MG tablet Take 1 tablet (20 mg total) by mouth daily. 01/14/22   CoxElnita Maxwell, MD  Blood Glucose Monitoring Suppl (ONETOUCH VERIO) w/Device KIT Use as instructed. Check blood glucose twice daily. E11.42 11/05/21   Rochel Brome, MD  Cyanocobalamin 1000 MCG/ML LIQD Take 2,500 mcg by mouth See admin instructions. Place 0.5 dropperful (2,500 mcg) sublingually daily    [provider]  fluticasone (FLONASE) 50 MCG/ACT nasal spray Place 2 sprays into both nostrils daily. 09/22/21   Rip Harbour, NP  gabapentin (NEURONTIN) 300 MG capsule Take 1 capsule (300 mg total) by mouth 2 (two) times daily. 11/04/21   Cox, Kirsten, MD  glucose blood test strip Use as instructed, Check blood glucose twice daily. 11/05/21   Rochel Brome, MD  glucose blood test strip  11/05/21   [provider]  ibuprofen (ADVIL,MOTRIN) 200 MG tablet Take 200 mg by mouth every 8 (eight) hours as needed for moderate pain or mild pain.    [provider]  insulin aspart protamine- aspart (NOVOLOG MIX 70/30) (70-30) 100 UNIT/ML injection Inject 0.4-0.6 mLs (40-60 Units total) into the skin See admin instructions. Inject 60 units into the skin before breakfast and 40 units units before supper/evening meal 05/19/22   Cox, Kirsten, MD  Insulin Pen Needle (NOVOFINE) 30G X 8 MM MISC Inject 10 each into the skin as needed. E11.42 02/24/22   Rochel Brome, MD  Lancets [facility] ULTRASOFT) lancets Use as instructed, Check  blood glucose twice daily. 11/05/21   Cox, Elnita Maxwell, MD  metFORMIN (GLUCOPHAGE) 1000 MG tablet TAKE 1 TABLET BY MOUTH 2 TIMES DAILY. 06/15/22   Cox, Elnita Maxwell, MD  metoprolol succinate (TOPROL-XL) 25 MG 24 hr tablet TAKE ONE (1) TABLET ONCE DAILY 11/04/21   Cox, Kirsten, MD  telmisartan (MICARDIS) 80 MG tablet TAKE ONE (1) TABLET BY MOUTH ONCE DAILY 11/04/21   Rochel Brome, MD     Allergies:    Allergies  Allergen Reactions   Nitroglycerin Other (See Comments)    Blood pressure 'bottomed out'   Statins Other (See Comments)    Elevation of liver enzymes   Tetanus Toxoid Other (See Comments)    Severe flu symptoms fever and chills   Penicillins Rash    Social History:   Social History   Socioeconomic History   Marital status: Married    Spouse name: Not on file   Number of children: Not on file   Years of education: Not on file   Highest education level: Not on file  Occupational History   Not on file  Tobacco Use   Smoking status: Former  Packs/day: 1.00    Years: 20.00    Total pack years: 20.00    Types: Cigarettes    Quit date: 10/19/1975    Years since quitting: 46.8   Smokeless tobacco: Never  Substance and Sexual Activity   Alcohol use: No   Drug use: No   Sexual activity: Not on file  Other Topics Concern   Not on file  Social History Narrative   Not on file   Social Determinants of Health   Financial Resource Strain: Not on file  Food Insecurity: Not on file  Transportation Needs: Not on file  Physical Activity: Not on file  Stress: Not on file  Social Connections: Not on file  Intimate Partner Violence: Not on file    Family History:   The patient's family history is not on file.    ROS:  Please see the history of present illness.  All other ROS reviewed and negative.     Physical Exam/Data:  There were no vitals filed for this visit. No intake or output data in the 24 hours ending 08/12/22 1141    05/18/2022    9:24 AM 01/12/2022    9:25 AM  09/22/2021   10:56 AM  Last 3 Weights  Weight (lbs) 265 lb 262 lb 264 lb  Weight (kg) 120.203 kg 118.842 kg 119.75 kg     There is no height or weight on file to calculate BMI.  General:  Well nourished, well developed, in no acute distress HEENT: normal Neck: no JVD Vascular: No carotid bruits; Distal pulses 2+ bilaterally   Cardiac:  irregularly irregular; no murmur  Lungs:  clear to auscultation bilaterally, no wheezing, rhonchi or rales  Abd: soft, nontender, no hepatomegaly  Ext: no edema Musculoskeletal:  No deformities, BUE and BLE strength normal and equal Skin: warm and dry  Neuro:  CNs 2-12 intact, no focal abnormalities noted Psych:  Normal affect    EKG:  The ECG that was done at [facility] and was personally reviewed and demonstrates atrial fibrillation, heart rate controlled, ST depression in the inferolateral leads  Relevant CV Studies:  Echo 05/15/2014 LV EF: 45% -   50%  Study Conclusions   - Left ventricle: The cavity size was normal. Systolic function was    mildly reduced. The estimated ejection fraction was in the range    of 45% to 50%. Severe hypokinesis of the inferior myocardium.    Left ventricular diastolic function parameters were normal.  - Left atrium: The atrium was mildly dilated.    Laboratory Data:  High Sensitivity Troponin:  No results for input(s): "TROPONINIHS" in the last 720 hours.    ChemistryNo results for input(s): "NA", "K", "CL", "CO2", "GLUCOSE", "BUN", "CREATININE", "CALCIUM", "MG", "GFRNONAA", "GFRAA", "ANIONGAP" in the last 168 hours.  No results for input(s): "PROT", "ALBUMIN", "AST", "ALT", "ALKPHOS", "BILITOT" in the last 168 hours. Lipids No results for input(s): "CHOL", "TRIG", "HDL", "LABVLDL", "LDLCALC", "CHOLHDL" in the last 168 hours. HematologyNo results for input(s): "WBC", "RBC", "HGB", "HCT", "MCV", "MCH", "MCHC", "RDW", "PLT" in the last 168 hours. Thyroid No results for input(s): "TSH", "FREET4" in the  last 168 hours. BNPNo results for input(s): "BNP", "PROBNP" in the last 168 hours.  DDimer No results for input(s): "DDIMER" in the last 168 hours.   Radiology/Studies:  No results found.   Assessment and Plan:   NSTEMI  -Chest pain started in the morning of 10/11 while eating breakfast.  Troponin went up from 2.01 --> 21 -->  22 on the old scale.  -Reportedly had ST depression in the inferolateral leads with no sign of ST elevation, however significant jump in the troponin is quite concerning.  According to cardiology report at [facility], patient is chest pain free when seen by cardiology service this morning.  -Transfer urgently to [facility] to undergo cardiac catheterization.  Cardiac catheterization has been discussed by Dr. Geraldo Pitter with the patient yesterday.  Newly diagnosed atrial fibrillation with RVR: No prior diagnosis of atrial fibrillation.  Fortunately, his heart rate seems to be well controlled on arrival.  Continue IV heparin.  Unclear if A-fib occurred in the setting of MI or has he been in A-fib for a while.  Obtain echocardiogram.  HTN  HLD  DM II   Risk Assessment/Risk Scores:    TIMI Risk Score for Unstable Angina or Non-ST Elevation MI:   The patient's TIMI risk score is 7, which indicates a 41% risk of all cause mortality, new or recurrent myocardial infarction or need for urgent revascularization in the next 14 days.       Severity of Illness: The appropriate patient status for this patient is INPATIENT. Inpatient status is judged to be reasonable and necessary in order to provide the required intensity of service to ensure the patient's safety. The patient's presenting symptoms, physical exam findings, and initial radiographic and laboratory data in the context of their chronic comorbidities is felt to place them at high risk for further clinical deterioration. Furthermore, it is not anticipated that the patient will be medically stable for  discharge from the hospital within 2 midnights of admission.   * I certify that at the point of admission it is my clinical judgment that the patient will require inpatient hospital care spanning beyond 2 midnights from the point of admission due to high intensity of service, high risk for further deterioration and high frequency of surveillance required.*   For questions or updates, please contact [place] Please consult [email or web address] for contact info under     Signed, Almyra Deforest, [place]  08/12/2022 11:41 AM    I have personally seen and examined this patient. I agree with the assessment and plan as outlined above.  Pt chest pain free on arrival to [facility].  Plan for cardiac cath today.  Also see full Cardiology Consult note from yesterday (Dr. Geraldo Pitter in [facility]).   Lauree Chandler, MD, [facility] 08/12/2022 1:41 PM

## 2022-08-12 NOTE — Progress Notes (Signed)
[facility] for inpatient diabetes management  Allergies  Allergen Reactions   Nitroglycerin Other (See Comments)    Blood pressure 'bottomed out'   Statins Other (See Comments)    Elevation of liver enzymes   Tetanus Toxoid Other (See Comments)    Severe flu symptoms fever and chills   Penicillins Rash    Vital Signs: Temp: 97.8 F (36.6 C) (10/12 1854) Temp Source: Oral (10/12 1854) BP: 158/91 (10/12 1854) Pulse Rate: 78 (10/12 1854) Intake/Output from previous day: No intake/output data recorded. Intake/Output from this shift: No intake/output data recorded.  Labs: No results for input(s): "WBC", "HGB", "HCT", "PLT", "APTT", "CREATININE", "LABCREA", "CREAT24HRUR", "MG", "PHOS", "ALBUMIN", "PROT", "AST", "ALT", "ALKPHOS", "BILITOT", "BILIDIR", "IBILI" in the last 72 hours. CrCl cannot be calculated (Patient's most recent lab result is older than the maximum 21 days allowed.).   Microbiology: No results found for this or any previous visit (from the past 720 hour(s)).  Medical History: Past Medical History:  Diagnosis Date   Abnormal liver function    Arthritis    Atherosclerotic heart disease of native coronary artery without angina pectoris    Diabetes mellitus without complication (HCC)    GERD (gastroesophageal reflux disease)    Hyperlipemia    Hypertension    MI (myocardial infarction) (HCC)    SCC (squamous cell carcinoma), arm, left    SCC (squamous cell carcinoma), arm, 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    Medications:  Facility-Administered Medications Prior to Admission  Medication Dose Route Frequency Provider Last Rate Last Admin   Insulin Pen Needle (NOVOFINE) 10 each  1 packet Subcutaneous PRN Cox, Kirsten, MD       Medications Prior to Admission  Medication Sig Dispense Refill Last Dose   aspirin EC 325 MG tablet Take 325 mg by mouth daily.   08/11/2022   atorvastatin (LIPITOR) 20 MG tablet Take 1 tablet (20 mg total) by  mouth daily. 90 tablet 3 08/11/2022   Cyanocobalamin 1000 MCG/ML LIQD Take 2,500 mcg by mouth See admin instructions. Place 0.5 dropperful (2,500 mcg) sublingually daily   unk   erythromycin ophthalmic ointment SMARTSIG:In Eye(s)   unk   fluticasone (FLONASE) 50 MCG/ACT nasal spray Place 2 sprays into both nostrils daily. 16 g 6 unk   gabapentin (NEURONTIN) 300 MG capsule Take 1 capsule (300 mg total) by mouth 2 (two) times daily. 180 capsule 3 08/11/2022   ibuprofen (ADVIL,MOTRIN) 200 MG tablet Take 200 mg by mouth every 8 (eight) hours as needed for moderate pain or mild pain.   unk   insulin aspart protamine- aspart (NOVOLOG MIX 70/30) (70-30) 100 UNIT/ML injection Inject 0.4-0.6 mLs (40-60 Units total) into the skin See admin instructions. Inject 60 units into the skin before breakfast and 40 units units before supper/evening meal 10 mL 0 08/11/2022   metFORMIN (GLUCOPHAGE) 1000 MG tablet TAKE 1 TABLET BY MOUTH 2 TIMES DAILY. 180 tablet 0 08/11/2022   metoprolol succinate (TOPROL-XL) 25 MG 24 hr tablet TAKE ONE (1) TABLET ONCE DAILY (Patient taking differently: Take 25 mg by mouth daily.) 90 tablet 3 08/12/2022 at morning -- given at hospital   telmisartan (MICARDIS) 80 MG tablet TAKE ONE (1) TABLET BY MOUTH ONCE DAILY (Patient taking differently: Take 80 mg by mouth daily.) 90 tablet 3 08/11/2022   Blood Glucose Monitoring Suppl (ONETOUCH VERIO) w/Device KIT Use as instructed. Check blood glucose twice daily. E11.42 1 kit 0    glucose blood test strip Use as instructed, Check  blood glucose twice daily. 100 each 12    glucose blood test strip       Insulin Pen Needle (NOVOFINE) 30G X 8 MM MISC Inject 10 each into the skin as needed. E11.42 100 each 2    Lancets (ONETOUCH ULTRASOFT) lancets Use as instructed, Check blood glucose twice daily. 100 each 12     Assessment: 81 y.o. M presents for cath. Now s/p cath and pharmacy asked to help with inpatient diabetes management. CBG 192.  Pt home  regimen: Novolog Mix 70/30 60 units qbreakfast and 40 units with evening meal. Also on metformin 1056m BIDWC.  Goal of Therapy:  CBGs < 180   Plan:  Novolog 70/30 40 units prior to breakfast and 25 units prior to evening meal (~2/3 of home insulin regimen to start) Will order moderate SSI TIDAC with HS coverage F/u CBG control tomorrow and consider restarting metformin tomorrow  CSherlon Handing PharmD, BCPS Please see amion for complete clinical pharmacist phone list 08/12/2022,7:43 PM

## 2022-08-12 NOTE — Progress Notes (Signed)
Dr. Angelena Form made aware of confusion. [place] now.

## 2022-08-13 ENCOUNTER — Encounter (HOSPITAL_COMMUNITY): Payer: Self-pay | Admitting: Cardiovascular Disease

## 2022-08-13 ENCOUNTER — Other Ambulatory Visit (HOSPITAL_COMMUNITY): Payer: Self-pay

## 2022-08-13 ENCOUNTER — Telehealth: Payer: Self-pay

## 2022-08-13 ENCOUNTER — Inpatient Hospital Stay (HOSPITAL_COMMUNITY): Payer: PPO

## 2022-08-13 ENCOUNTER — Telehealth (HOSPITAL_COMMUNITY): Payer: Self-pay | Admitting: Pharmacy Technician

## 2022-08-13 DIAGNOSIS — I214 Non-ST elevation (NSTEMI) myocardial infarction: Secondary | ICD-10-CM

## 2022-08-13 LAB — GLUCOSE, CAPILLARY
Glucose-Capillary: 208 mg/dL — ABNORMAL HIGH (ref 70–99)
Glucose-Capillary: 288 mg/dL — ABNORMAL HIGH (ref 70–99)

## 2022-08-13 LAB — CBC
HCT: 36 % — ABNORMAL LOW (ref 39.0–52.0)
Hemoglobin: 12.1 g/dL — ABNORMAL LOW (ref 13.0–17.0)
MCH: 32.2 pg (ref 26.0–34.0)
MCHC: 33.6 g/dL (ref 30.0–36.0)
MCV: 95.7 fL (ref 80.0–100.0)
Platelets: 114 10*3/uL — ABNORMAL LOW (ref 150–400)
RBC: 3.76 MIL/uL — ABNORMAL LOW (ref 4.22–5.81)
RDW: 14.3 % (ref 11.5–15.5)
WBC: 8.7 10*3/uL (ref 4.0–10.5)
nRBC: 0 % (ref 0.0–0.2)

## 2022-08-13 LAB — BASIC METABOLIC PANEL
Anion gap: 8 (ref 5–15)
BUN: 17 mg/dL (ref 8–23)
CO2: 22 mmol/L (ref 22–32)
Calcium: 9.1 mg/dL (ref 8.9–10.3)
Chloride: 106 mmol/L (ref 98–111)
Creatinine, Ser: 1.2 mg/dL (ref 0.61–1.24)
GFR, Estimated: >60 mL/min (ref >60)
Glucose, Bld: 240 mg/dL — ABNORMAL HIGH (ref 70–99)
Potassium: 4.1 mmol/L (ref 3.5–5.1)
Sodium: 136 mmol/L (ref 135–145)

## 2022-08-13 LAB — HEMOGLOBIN A1C
Hgb A1c MFr Bld: 6.8 % — ABNORMAL HIGH (ref 4.8–5.6)
Mean Plasma Glucose: 148.46 mg/dL

## 2022-08-13 MED ORDER — PERFLUTREN LIPID MICROSPHERE
1.0000 mL | INTRAVENOUS | Status: AC | PRN
  Administered 2022-08-13: 2 mL via INTRAVENOUS

## 2022-08-13 MED ORDER — CLOPIDOGREL BISULFATE 75 MG PO TABS
75.0000 mg | ORAL_TABLET | Freq: Every day | ORAL | 2 refills | Status: DC
  Filled 2022-08-13: qty 30, 30d supply, fill #0

## 2022-08-13 MED ORDER — ASPIRIN 81 MG PO CHEW
81.0000 mg | CHEWABLE_TABLET | Freq: Every day | ORAL | 0 refills | Status: AC
  Filled 2022-08-13: qty 30, 30d supply, fill #0

## 2022-08-13 MED ORDER — APIXABAN 5 MG PO TABS
5.0000 mg | ORAL_TABLET | Freq: Two times a day (BID) | ORAL | Status: DC
  Administered 2022-08-13: 5 mg via ORAL
  Filled 2022-08-13: qty 1

## 2022-08-13 MED ORDER — APIXABAN 5 MG PO TABS
5.0000 mg | ORAL_TABLET | Freq: Two times a day (BID) | ORAL | 2 refills | Status: DC
  Filled 2022-08-13: qty 60, 30d supply, fill #0

## 2022-08-13 NOTE — Telephone Encounter (Signed)
**Note De-identified Mickeal Daws Obfuscation** -----  **Note De-Identified Burgundy Matuszak Obfuscation** Message from Lily Kocher, PA-C sent at 08/13/2022  9:27 AM EDT ----- Regarding: TOC call This patient is scheduled to see Scott on 10/20 '@8'$ :25 am and needs TOC call.   Thanks!  Lily Kocher

## 2022-08-13 NOTE — Telephone Encounter (Signed)
Pharmacy Patient Advocate Encounter  Insurance verification completed.    The patient is insured through [employer] Part D   The patient is currently admitted and ran test claims for the following: Eliquis.  Copays and coinsurance results were relayed to Inpatient clinical team.      

## 2022-08-13 NOTE — Discharge Summary (Signed)
Discharge Summary    Patient ID: Andre Shaffer MRN: [id number]; DOB: [birth year]  Admit date: 08/12/2022 Discharge date: 08/13/2022  PCP:  Rochel Brome, MD   [place] Providers Cardiologist:  None        Discharge Diagnoses    Principal Problem:   NSTEMI (non-ST elevated myocardial infarction) [facility]) Active Problems:   Non-ST elevation (NSTEMI) myocardial infarction [facility])    Diagnostic Studies/Procedures      Prox RCA lesion is 100% stenosed.   Origin to Prox Graft lesion is 100% stenosed.   Ost LAD to Mid LAD lesion is 60% stenosed.   Mid LAD lesion is 100% stenosed.   Mid Cx to Dist Cx lesion is 100% stenosed.   Mid Graft lesion is 99% stenosed.   A drug-eluting stent was successfully placed using a SYNERGY XD 3.0X16.   Post intervention, there is a 0% residual stenosis.   SVG graft was not visualized.   LIMA graft was visualized by angiography.   SVG graft was visualized by angiography.   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 The mid and distal LAD fills from the patent LIMA graft. There is diffuse disease in the LAD beyond graft insertion.  Chronic occlusion distal Circumflex. Patent SVG to OM with severe disease in distal body of SVG to OM Chronic occlusion of the proximal RCA. Occluded vein graft to RCA. The RCA fills from left to right collaterals.  LVEDP=16-18 mmHg   Recommendations: Continue DAPT with ASA and Plavix for one month then stop ASA as he will likely be discharged on a DOAC with new atrial fib. Continue beta blocker and statin.   Diagnostic Dominance: Right  Intervention   _____________   History of Present Illness     Andre Shaffer is a 81 year old male with past medical history of CAD s/p CABG x4 in 1997 by Dr. Servando Snare, hypertension, hyperlipidemia and DM2.  Myoview obtained on 05/08/2014 showed  EF 62%, inferolateral akinesis with fixed inferolateral defect consistent with scar, overall considered low risk study.  Echocardiogram obtained around the same time showed EF 45 to 50%, mildly reduced LV, severe hypokinesis of the inferior myocardium, mild LAE.  Carotid Doppler obtained in July 2016 showed no significant disease his last follow-up with Dr. Johnsie Cancel was in August 2015, he was supposed to follow-up in 6 months, however has been lost to follow-up since.   He presented to [facility] on 08/11/2022 with sudden onset of substernal and diffuse chest tightness with mild nausea and vomiting while eating breakfast.  Initial EKG showed atrial fibrillation, heart rate 73, marked ST abnormality concerning for possible inferior subendocardial injury.  Hemoglobin 13.5, white blood cell count 7.3, platelet 123.  Creatinine 1.1.  Initial troponin 2.01, subsequent troponin trended up to 21.7 --> 22.80.  Urinalysis revealed 1+ urine protein, 4+ urine glucose.  Total cholesterol 99, triglycerides 73, HDL 27, LDL 57.  Hemoglobin A1c 6.9.  Magnesium 1.6.  He was started on IV heparin and nitroglycerin drip.  Prior to ED arrival, patient took 2 doses of 325 mg aspirin at home.  He was also given 1 L of normal saline at home.  Patient was seen in consultation by Dr. Geraldo Pitter on 08/11/2022 who recommended transfer to [facility] on 10/12 for cardiac catheterization.   Hospital Course     NSTEMI  Upon arrival to [facility], patient was taken to the cath lab with Dr. Angelena Form. 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 Patient had successful PTCA/DESx1, body of SVG to OM. He has remained chest pain free. Following LHC, patient had an acute episode of confusion/agitation. On day of discharge, patient is alert/oriented without confusion. Discharge med regimen will include triple therapy with ASA/Plavix/Eliquis, Metoprolol Succinate 3m. Plan to drop  ASA after 1 month of triple therapy.  Atrial fibrillation, secondary hypercoagulable state  Patient with newly noted afib this admission. Rates are well controlled and patient is without symptoms. Unknown whether this is acute onset with MI or chronic. Will discharge on Eliquis 517mBID and arrange close cardiology follow-up.   Hyperlipidemia  LDL 46 on Atorvastatin 2061mPatient with history of transaminitis secondary to statin therapy. Continue 73m64mse.  DM type II  Resume home metformin on Sunday 10/15. Can resume home insulin at discharge.  Hypertension  Continue Telmisartan, Metoprolol Succinate 25mg52m   Did the patient have an acute coronary syndrome (MI, NSTEMI, STEMI, etc) this admission?:  Yes                               AHA/ACC Clinical Performance & Quality Measures: Aspirin prescribed? - Yes ADP Receptor Inhibitor (Plavix/Clopidogrel, Brilinta/Ticagrelor or Effient/Prasugrel) prescribed (includes medically managed patients)? - Yes Beta Blocker prescribed? - Yes High Intensity Statin (Lipitor 40-80mg 40mrestor 20-40mg) 22mcribed? - No - history of transaminitis with high intensity statin EF assessed during THIS hospitalization? - Yes For EF <40%, was ACEI/ARB prescribed? - Not Applicable (EF >/= 40%) Fo42%F <40%, Aldosterone Antagonist (Spironolactone or Eplerenone) prescribed? - Not Applicable (EF >/= 40%) Ca59%ac Rehab Phase II ordered (including medically managed patients)? - Yes       The patient will be scheduled for a TOC follow up appointment on 10/20.  A message has been [facility] at the office where the patient should be seen for follow up.  _____________  Discharge Vitals Blood pressure (!) 143/80, pulse 71, temperature 97.9 F (36.6 C), temperature source Oral, resp. rate 19, SpO2 97 %.  There were no vitals filed for this visit.  Physical Exam Vitals reviewed.  HENT:     Head: Normocephalic.     Comments:  Patient with oozing wound on left side of face. Recent skin biopsy obtained here. Bleeding is controlled with dressings.  Eyes:     Pupils: Pupils are equal, round, and reactive to light.  Cardiovascular:     Rate and Rhythm: Normal rate. Rhythm irregular.     Pulses: Normal pulses.     Heart sounds: Normal heart sounds.  Pulmonary:     Effort: Pulmonary effort is normal.     Breath sounds: Normal breath sounds.  Abdominal:     General: Abdomen is flat.     Palpations: Abdomen is soft.  Musculoskeletal:        General: Normal range of motion.     Cervical back: Normal range of motion and neck supple.  Skin:    General: Skin is warm and dry.     Capillary Refill: Capillary refill takes less than 2 seconds.  Neurological:     General: No focal deficit present.     Mental Status: He is alert and oriented to person, place, and time.  Psychiatric:        Mood and Affect: Mood normal.        Behavior: Behavior normal.        Thought Content: Thought content normal.        Judgment: Judgment normal.      Labs & Radiologic Studies    CBC Recent Labs    08/13/22 0325  WBC 8.7  HGB 12.1*  HCT 36.0*  MCV 95.7  PLT 119*   Basic Metabolic Panel Recent Labs    08/13/22 0325  NA 136  K 4.1  CL 106  CO2 22  GLUCOSE 240*  BUN 17  CREATININE 1.20  CALCIUM 9.1   Liver Function Tests No results for input(s): "AST", "ALT", "ALKPHOS", "BILITOT", "PROT", "ALBUMIN" in the last 72 hours. No results for input(s): "LIPASE", "AMYLASE" in the last 72 hours. High Sensitivity Troponin:   No results for input(s): "TROPONINIHS" in the last 720 hours.  BNP Invalid input(s): "POCBNP" D-Dimer No results for input(s): "DDIMER" in the last 72 hours. Hemoglobin A1C Recent Labs    08/13/22 0325  HGBA1C 6.8*   Fasting Lipid Panel No results for input(s): "CHOL", "HDL", "LDLCALC", "TRIG", "CHOLHDL", "LDLDIRECT" in the last 72 hours. Thyroid Function Tests No results for input(s):  "TSH", "T4TOTAL", "T3FREE", "THYROIDAB" in the last 72 hours.  Invalid input(s): "FREET3" _____________  CARDIAC CATHETERIZATION  Result Date: 08/13/2022   Prox RCA lesion is 100% stenosed.   Origin to Prox Graft lesion is 100% stenosed.   Ost LAD to Mid LAD lesion is 60% stenosed.   Mid LAD lesion is 100% stenosed.   Mid Cx to Dist Cx lesion is 100% stenosed.   Mid Graft lesion is 99% stenosed.   A drug-eluting stent was successfully placed using a SYNERGY XD 3.0X16.   Post intervention, there is a 0% residual stenosis.   SVG graft was not visualized.   LIMA graft was visualized by angiography.   SVG graft was visualized by angiography. 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 The mid and distal LAD fills from the patent LIMA graft. There is diffuse disease in the LAD beyond graft insertion. Chronic occlusion distal Circumflex. Patent SVG to OM with severe disease in distal body of SVG to OM Chronic occlusion of the proximal RCA. Occluded vein graft to RCA. The RCA fills from left to right collaterals. LVEDP=16-18 mmHg Recommendations: Continue DAPT with ASA and Plavix for one month then stop ASA as he will likely be discharged on a DOAC with new atrial fib. Continue beta blocker and statin.   Disposition   Pt is being discharged home today in good condition.  Follow-up Plans & Appointments     Follow-up Information     Liliane Shi, PA-C. Go on 08/20/2022.   Specialties: Cardiology, Physician Assistant Why: Follow up appointment at 8:25am. Contact information: [address] [place] [address] [phone]                Discharge Instructions     Amb Referral to Cardiac Rehabilitation   Complete by: As directed    [place]   Diagnosis:  Coronary Stents NSTEMI     After initial evaluation and assessments completed: Virtual  Based Care may be provided alone or in conjunction with Phase 2 Cardiac Rehab based on patient barriers.: Yes  Intensive Cardiac Rehabilitation (ICR) [place] location only OR Traditional Cardiac Rehabilitation (TCR) *If criteria for ICR are not met will enroll in TCR [facility] only): Yes   Diet - low sodium heart healthy   Complete by: As directed    Discharge instructions   Complete by: As directed    Please wait until 10/15 to resume Metformin.  NO HEAVY LIFTING OR SEXUAL ACTIVITY X 7 DAYS. NO DRIVING X 2-3 DAYS. NO SOAKING BATHS, HOT TUBS, POOLS, ETC., X 7 DAYS.   Increase activity slowly   Complete by: As directed         Discharge Medications   Allergies as of 08/13/2022       Reactions   Nitroglycerin Other (See Comments)   Blood pressure 'bottomed out'   Statins Other (See Comments)   Elevation of liver enzymes   Tetanus Toxoid Other (See Comments)   Severe flu symptoms fever and chills   Penicillins Rash        Medication List     STOP taking these medications    aspirin EC 325 MG tablet Replaced by: Aspirin Low Dose 81 MG chewable tablet   erythromycin ophthalmic ointment   ibuprofen 200 MG tablet Commonly known as: ADVIL       TAKE these medications    Aspirin Low Dose 81 MG chewable tablet Generic drug: aspirin Chew 1 tablet (81 mg total) by mouth daily. Replaces: aspirin EC 325 MG tablet   atorvastatin 20 MG tablet Commonly known as: LIPITOR Take 1 tablet (20 mg total) by mouth daily.   clopidogrel 75 MG tablet Commonly known as: PLAVIX Take 1 tablet (75 mg total) by mouth daily with breakfast.   Cyanocobalamin 1000 MCG/ML Liqd Take 2,500 mcg by mouth See admin instructions. Place 0.5 dropperful (2,500 mcg) sublingually daily   Eliquis 5 MG Tabs tablet Generic drug: apixaban Take 1 tablet (5 mg total) by mouth 2 (two) times daily.   fluticasone 50 MCG/ACT nasal spray Commonly known as: FLONASE Place 2 sprays into both nostrils daily.    gabapentin 300 MG capsule Commonly known as: NEURONTIN Take 1 capsule (300 mg total) by mouth 2 (two) times daily.   glucose blood test strip Use as instructed, Check blood glucose twice daily.   glucose blood test strip   insulin aspart protamine- aspart (70-30) 100 UNIT/ML injection Commonly known as: NOVOLOG MIX 70/30 Inject 0.4-0.6 mLs (40-60 Units total) into the skin See admin instructions. Inject 60 units into the skin before breakfast and 40 units units before supper/evening meal   Insulin Pen Needle 30G X 8 MM Misc Commonly known as: NOVOFINE Inject 10 each into the skin as needed. E11.42   metFORMIN 1000 MG tablet Commonly known as: GLUCOPHAGE TAKE 1 TABLET BY MOUTH 2 TIMES DAILY.   metoprolol succinate 25 MG 24 hr tablet Commonly known as: TOPROL-XL TAKE ONE (1) TABLET ONCE DAILY What changed:  how much to take how to take this when to take this additional instructions   onetouch ultrasoft lancets Use as instructed, Check blood glucose twice daily.   OneTouch Verio w/Device Kit Use as instructed. Check blood glucose twice daily. E11.42   telmisartan 80 MG tablet Commonly known as: MICARDIS TAKE ONE (1) TABLET BY MOUTH ONCE DAILY What changed:  how much to take how to take this when to take this additional instructions           Outstanding Labs/Studies     Duration of Discharge Encounter   Greater than  30 minutes including physician time.  Delton Coombes, PA-C 08/13/2022, 11:52 AM

## 2022-08-13 NOTE — Progress Notes (Signed)
Mobility Specialist - Progress Note   08/13/22 0923  Mobility  Activity Ambulated with assistance in hallway  Level of Assistance Contact guard assist, steadying assist  Assistive Device Front wheel walker  Distance Ambulated (ft) 470 ft  Activity Response Tolerated well  Mobility Referral Yes  $Mobility charge 1 Mobility   Pt was received in bed and agreeable to mobility. Pt c/o RUE pain throughout ambulation. Pt was returned to bed with all needs met.  Larey Seat

## 2022-08-13 NOTE — Progress Notes (Signed)
Echocardiogram 2D Echocardiogram has been performed.  Fidel Levy 08/13/2022, 10:32 AM

## 2022-08-13 NOTE — TOC Benefit Eligibility Note (Signed)
Patient Teacher, English as a foreign language completed.    The patient is currently admitted and upon discharge could be taking Eliquis 5 mg.  The current 30 day co-pay is $45.00.   The patient is insured through [place], [place] Patient Advocate Specialist [place] Patient Advocate Team Direct Number: [phone]  Fax: [phone]

## 2022-08-13 NOTE — Care Management (Signed)
  Transition of Care [facility]) Screening Note   Patient Details  Name: ORMOND LAZO Date of Birth: [birth year]   Transition of Care [facility]) CM/SW Contact:    Bethena Roys, RN Phone Number: 08/13/2022, 9:29 AM    Transition of Care Department [facility]) has reviewed the patient and no TOC needs have been identified at this time. We will continue to monitor patient advancement through interdisciplinary progression rounds. If new patient transition needs arise, please place a TOC consult.

## 2022-08-13 NOTE — Progress Notes (Addendum)
CARDIAC REHAB PHASE I     Pt has just returned from walk with mobility. Per pt he tolerated well. Post MI/stent education including site care, exercise guidelines, hearth healthy diabetic diet, MI booklet, restrictions, risk factors, antiplatelet therapy importance and CRP2 reviewed with pt and wife. All questions and concerns addressed. Will refer to [place] for CRP2. Plan for home today.  0930-1000  Vanessa Barbara, RN BSN 08/13/2022 9:49 AM

## 2022-08-14 LAB — LIPOPROTEIN A (LPA): Lipoprotein (a): 52 nmol/L — ABNORMAL HIGH (ref <75.0)

## 2022-08-16 ENCOUNTER — Telehealth (HOSPITAL_COMMUNITY): Payer: Self-pay

## 2022-08-16 ENCOUNTER — Encounter: Payer: Self-pay | Admitting: *Deleted

## 2022-08-16 ENCOUNTER — Telehealth: Payer: Self-pay | Admitting: *Deleted

## 2022-08-16 NOTE — Telephone Encounter (Signed)
Per phase I cardiac rehab, fax referral to [place] cardiac rehab. 

## 2022-08-16 NOTE — Telephone Encounter (Signed)
**Note De-Identified Andre Shaffer Obfuscation** Patient contacted regarding discharge from [facility] on 08/13/2022.  Patient understands to follow up with provider Richardson Dopp, PA-c on 08/20/2022 at 8:25 at [address] in [place] at [place], [place].  Patient understands discharge instructions? Yes Patient understands medications and regiment? Yes Patient understands to bring all medications to this visit? Yes  Ask patient:  Are you enrolled in My Chart: No and not interested at this time  The pt states that he is doing well and is without c/o CP/discomfort, nausea, vomiting, diaphoresis, SOB, dizziness or lightheadedness.  He verified that he does have [facility]'s phone number and is aware to call us if he has any questions or concerns. He thanked me for my call.

## 2022-08-16 NOTE — Patient Outreach (Signed)
  Care Coordination [facility] Note Transition Care Management Follow-up Telephone Call Date of discharge and from where: [facility] 08/13/22 How have you been since you were released from the hospital? Per wife, "he's doing good" Any questions or concerns? Yes. Answered question regarding generic for Eliquis. Advised that FDA had cleared the way for generic but that there won't be one until at least 2026 b/c of legal dispute between manufacturers.   Items Reviewed: Did the pt receive and understand the discharge instructions provided? Yes  Medications obtained and verified? Yes  Other? Yes Provided basic information on prescription assistance for Eliquis. May not hit Medicare Coverage Gap this year, but likely will next year if he remains on Eliquis. Encouraged to talk with cardio PharmD about assistance when that happens. Any new allergies since your discharge? No  Dietary orders reviewed? Yes Do you have support at home? Yes   Home Care and Equipment/Supplies: Were home health services ordered? no If so, what is the name of the agency? N/a  Has the agency set up a time to come to the patient's home? not applicable Were any new equipment or medical supplies ordered?  No What is the name of the medical supply agency? N/a Were you able to get the supplies/equipment? not applicable Do you have any questions related to the use of the equipment or supplies? No  Functional Questionnaire: (I = Independent and D = Dependent) ADLs: I  Bathing/Dressing- I  Meal Prep- I  Eating- I  Maintaining continence- I  Transferring/Ambulation- I  Managing Meds- I  Follow up appointments reviewed:  PCP Hospital f/u appt confirmed? Yes  Scheduled to see Dr Tobie Poet on 08/25/22 @ 8:25. [place] [facility] f/u appt confirmed? Yes  Scheduled to see Richardson Dopp, PA-C (cardio) on 08/20/22 @ 8:25. Cardiac rehab ordered and they will call to schedule. Are transportation arrangements needed? No  If their condition  worsens, is the pt aware to call PCP or go to the Emergency Dept.? Yes Was the patient provided with contact information for the PCP's office or ED? Yes Was to pt encouraged to call back with questions or concerns? Yes  SDOH assessments and interventions completed:   Yes  Care Coordination Interventions Activated:  No   Care Coordination Interventions:  No Care Coordination interventions needed at this time.   Encounter Outcome:  Pt. Visit Completed    Chong Sicilian, BSN, RN-BC RN Care Coordinator [place] Direct Dial: [phone] Main #: [phone]

## 2022-08-17 ENCOUNTER — Other Ambulatory Visit (HOSPITAL_COMMUNITY): Payer: Self-pay

## 2022-08-17 ENCOUNTER — Telehealth (HOSPITAL_COMMUNITY): Payer: Self-pay

## 2022-08-17 NOTE — Telephone Encounter (Signed)
Pharmacy Transitions of Care Follow-up Telephone Call  Date of discharge: 10/13 Discharge Diagnosis: NSTEMI  How have you been since you were released from the hospital? He has been doing well without any issues or concerns with his medications. I spoke with his wife and we talked about what to do if they notice any s/sx of bleeding. Counseled on the importance of coming straight to the hospital, however, she stated she will not have him wait in the ER if this occurs, rather she will be bringing him to his cardiologist or PCP. Explained that they will also refer to the hospital at that point. Was able to transfer his medications out to his home pharmacy.    Medication changes made at discharge: START taking these medications  START taking these medications  Aspirin Low Dose 81 MG chewable tablet Generic drug: aspirin Chew 1 tablet (81 mg total) by mouth daily. Replaces: aspirin EC 325 MG tablet  clopidogrel 75 MG tablet Commonly known as: PLAVIX Take 1 tablet (75 mg total) by mouth daily with breakfast.  Eliquis 5 MG Tabs tablet Generic drug: apixaban Take 1 tablet (5 mg total) by mouth 2 (two) times daily.   CHANGE how you take these medications  CHANGE how you take these medications  metoprolol succinate 25 MG 24 hr tablet Commonly known as: TOPROL-XL TAKE ONE (1) TABLET ONCE DAILY What changed:  how much to take how to take this when to take this additional instructions  telmisartan 80 MG tablet Commonly known as: MICARDIS TAKE ONE (1) TABLET BY MOUTH ONCE DAILY What changed:  how much to take how to take this when to take this additional instructions   CONTINUE taking these medications  CONTINUE taking these medications  atorvastatin 20 MG tablet Commonly known as: LIPITOR Take 1 tablet (20 mg total) by mouth daily.  Cyanocobalamin 1000 MCG/ML Liqd  fluticasone 50 MCG/ACT nasal spray Commonly known as: FLONASE Place 2 sprays into both nostrils daily.   gabapentin 300 MG capsule Commonly known as: NEURONTIN Take 1 capsule (300 mg total) by mouth 2 (two) times daily.  * glucose blood test strip Use as instructed, Check blood glucose twice daily.  * glucose blood test strip  insulin aspart protamine- aspart (70-30) 100 UNIT/ML injection Commonly known as: NOVOLOG MIX 70/30 Inject 0.4-0.6 mLs (40-60 Units total) into the skin See admin instructions. Inject 60 units into the skin before breakfast and 40 units units before supper/evening meal  Insulin Pen Needle 30G X 8 MM Misc Commonly known as: NOVOFINE Inject 10 each into the skin as needed. E11.42  metFORMIN 1000 MG tablet Commonly known as: GLUCOPHAGE TAKE 1 TABLET BY MOUTH 2 TIMES DAILY.  onetouch ultrasoft lancets Use as instructed, Check blood glucose twice daily.  OneTouch Verio w/Device Kit Use as instructed. Check blood glucose twice daily. E11.42   very important  * This list has 2 medication(s) that are the same as other medications prescribed for you. Read the directions carefully, and ask your doctor or other care provider to review them with you.    STOP taking these medications  STOP taking these medications  aspirin EC 325 MG tablet Replaced by: Aspirin Low Dose 81 MG chewable tablet  erythromycin ophthalmic ointment  ibuprofen 200 MG tablet Commonly known as: ADVIL    Medication changes verified by the patient? yes  Medication Accessibility:  Home Pharmacy: [facility] in [place]   Was the patient provided with refills on discharged medications? yes   Have all prescriptions  been transferred from [facility] to home pharmacy? yes   Is the patient interested in using a [facility]? no  Medication Review: APIXABAN (ELIQUIS)  Apixaban 5 mg BID initiated on 10/13 for Afib.  - Discussed importance of taking medication around the same time every day.  - Advised patient of medications to avoid (NSAIDs, ASA maintenance doses>100 mg daily)  - Educated that  Tylenol (acetaminophen) will be the preferred analgesic to prevent risk of bleeding. - Emphasized importance of monitoring for signs and symptoms of bleeding (abnormal bruising, prolonged bleeding, nose bleeds, bleeding from gums, discolored urine, black tarry stools)  - Advised patient to alert all providers of anticoagulation therapy prior to starting a new medication or having a procedure.  CLOPIDOGREL (PLAVIX) Clopidogrel 75 mg once daily initiated on 10/13 for NSTEMI - Discussed importance of taking medication around the same time every day.  - Advised patient of medications to avoid (NSAIDs, ASA maintenance doses>100 mg daily)  - Educated that Tylenol (acetaminophen) will be the preferred analgesic to prevent risk of bleeding. - Emphasized importance of monitoring for signs and symptoms of bleeding (abnormal bruising, prolonged bleeding, nose bleeds, bleeding from gums, discolored urine, black tarry stools)  - Advised patient to alert all providers of anticoagulation therapy prior to starting a new medication or having a procedure.  Follow-up Appointments:  Date Visit Type Length Department   08/20/2022  8:25 AM OFFICE VISIT 25 min [facility] [[id number]]  Patient Instructions:   Please arrive 15 minutes prior to your appointment. This will allow [place] to verify and update your medical record and ensure a full appointment for you within the time allotted.         08/25/2022 10:20 AM OFFICE VISIT 20 min [facility] [[id number]]  Patient Instructions:    Maryan Puls, PharmD PGY-1 [facility] Resident

## 2022-08-19 DIAGNOSIS — I4891 Unspecified atrial fibrillation: Secondary | ICD-10-CM | POA: Insufficient documentation

## 2022-08-19 DIAGNOSIS — I4819 Other persistent atrial fibrillation: Secondary | ICD-10-CM | POA: Insufficient documentation

## 2022-08-19 NOTE — Progress Notes (Signed)
Cardiology Office Note:    Date:  08/20/2022   ID:  EUCLID CASSETTA, DOB 1941-07-08, MRN [id number]  PCP:  Rochel Brome, MD  [facility] Cardiologist:  Jenkins Rouge, MD    Referring MD: Rochel Brome, MD   Chief Complaint:  Hospitalization Follow-up (S/p NSTEMI tx with DES to S-OM; AFib)    Patient Profile: Coronary artery disease  S/p Remote Inf MI and CABG x 4 in 1997 NSTEMI 08/2022 s/p 3 x 16 mm Synergy DES to S-OM L-LAD, S-Dx patent; S-RCA occluded  Ischemic CM Echocardiogram in 2015: EF 45-50, inf HK Echo 10/23: EF 55-60 Persistent atrial fibrillation Echo 10/23: Moderate biatrial enlargement Diabetes mellitus  Hypertension  Hyperlipidemia  Hx of transaminitis with high intensity statin Rx.   Prior CV Studies: LEFT HEART CATH 08/12/2022 LAD ostial 69, mid 100 CTO LCx mid 100 CTO RCA proximal 100 CTO; RPL 3 filled by collaterals from OM 2 SVG-RCA 100 CTO LIMA-LAD patent SVG-OM3 99 SVG-D2 patent PCI: 3 x 16 mm Synergy DES to the SVG-OM3  Severe three vessel CAD s/p 3V CABG with 3/3 patent bypass grafts with severe disease in the SVG to OM Successful PTCA/DES x 1 body of SVG to OM Chronic occlusion mid LAD. The mid and distal LAD fills from the patent LIMA graft. There is diffuse disease in the LAD beyond graft insertion. Chronic occlusion distal Circumflex. Patent SVG to OM with severe disease in distal body of SVG to OM Chronic occlusion of the proximal RCA. Occluded vein graft to RCA. The RCA fills from left to right collaterals. LVEDP=16-18 mmHg  Recommendations: Continue DAPT with ASA and Plavix for one month then stop ASA as he will likely be discharged on a DOAC with new atrial fib. Continue beta blocker and statin.    Echocardiogram 08/13/2022 EF 55-60, no RWMA, normal PASP, moderate BAE, trivial MR, RAP 3  History of Present Illness:   Andre Shaffer is a 81 y.o. male with the above problem list.  He was admitted 10/12 to 10/13 with a  NSTEMI. He initially presented to [facility] and was transferred to [facility]. Cath revealed chronically occluded S-RCA, patent L-LAD and S-Dx and severe disease in the body of the S-OM graft which was treated with a DES. Pt was noted to be in AFib with controlled rate. He was started on Eliquis. Pt will need to remain on ASA, Plavix and Eliquis for 1 month, then DC ASA.    He returns for f/u. He is here with his wife. He has done well since DC from the hospital. He has not had chest pain, shortness of breath, syncope, orthopnea, significant leg edema. He is tolerating his meds well w/o bleeding issues. He is interested in cardiac rehab and plans to attend at [facility] He last saw Dr. Johnsie Cancel in 2015. He lives in [place] but would rather continue to f/u with Dr. Johnsie Cancel here in [place].  Of note, as the patient was being discharged from the office, he suddenly vomited x 1. He has not had symptoms of nausea, fever, abd pain. We rechecked his VS and his BP was 118/58. Repeat EKG did not show any acute changes. He is not having chest pain or shortness of breath. Repeat exam demonstrated Irr Irr rhythm, lungs clear to ausc bilat. I advised him to contact his PCP if he starts to have more vomiting or diarrhea once he gets home today.        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   aspirin 81 MG chewable tablet Chew 1 tablet (81 mg total) by mouth daily.   atorvastatin (LIPITOR) 20 MG tablet Take 1 tablet (20 mg total) by mouth daily.   Blood Glucose Monitoring Suppl (ONETOUCH VERIO) w/Device KIT Use as instructed. Check blood glucose twice daily. E11.42   clopidogrel (PLAVIX) 75 MG tablet Take 1 tablet (75 mg total) by mouth daily with breakfast.   Cyanocobalamin 1000 MCG/ML LIQD Take 2,500 mcg by mouth See admin instructions. Place 0.5 dropperful (2,500 mcg) sublingually daily   gabapentin (NEURONTIN) 300 MG capsule Take 1 capsule (300 mg total) by mouth 2 (two) times daily.   glucose blood test strip Use as instructed, Check blood glucose twice daily.   glucose blood test strip    insulin aspart protamine- aspart (NOVOLOG MIX 70/30) (70-30) 100 UNIT/ML injection Inject 0.4-0.6 mLs (40-60 Units total) into the skin See admin instructions. Inject 60 units into the skin before breakfast and 40 units units before supper/evening meal   Insulin Pen Needle (NOVOFINE) 30G X 8 MM MISC Inject 10 each into the skin as needed. E11.42   Lancets (ONETOUCH ULTRASOFT) lancets Use as instructed, Check blood glucose twice daily.   metFORMIN (GLUCOPHAGE) 1000 MG tablet TAKE 1 TABLET BY MOUTH 2 TIMES DAILY.   metoprolol succinate (TOPROL-XL) 25 MG 24 hr tablet TAKE ONE (1) TABLET ONCE DAILY (Patient taking differently: Take 25 mg by mouth daily.)   telmisartan (MICARDIS) 80 MG tablet TAKE ONE (1) TABLET BY MOUTH ONCE DAILY (Patient taking differently: Take 80 mg by mouth daily.)   Current Facility-Administered Medications for the 08/20/22 encounter (Office Visit) with Richardson Dopp T, PA-C  Medication   Insulin Pen Needle (NOVOFINE) 10 each    Allergies:   Nitroglycerin, Statins, Tetanus toxoid, and Penicillins   Social History   Tobacco Use   Smoking status: Former    Packs/day: 1.00    Years:  20.00    Total pack years: 20.00    Types: Cigarettes    Quit date: 10/19/1975    Years since quitting: 46.8   Smokeless tobacco: Never  Substance Use Topics   Alcohol use: No   Drug use: No    Family Hx: The patient's family history is not on file.  Review of Systems  Gastrointestinal:  Negative for hematochezia.  Genitourinary:  Negative for hematuria.     EKGs/Labs/Other Test Reviewed:    EKG:  EKG is  ordered today.  The ekg ordered today demonstrates atrial fibrillation, HR 66, inferior T wave inversions, QTc 454, PVC  EKG #2: Atrial fibrillation, HR 76, normal axis, inferior T wave inversions, QTc 452, no change since initial tracing  Recent Labs: 05/18/2022: ALT 10; TSH 4.100 08/13/2022: BUN 17; Creatinine, Ser 1.20; Hemoglobin 12.1; Platelets 114; Potassium 4.1; Sodium 136   Recent Lipid Panel Recent Labs    05/18/22 1045  CHOL 91*  TRIG 63  HDL 31*  LDLCALC 46     Risk Assessment/Calculations/Metrics:    CHA2DS2-VASc Score = 5   This indicates a 7.2% annual risk of stroke. The patient's score is based upon: CHF History: 0 HTN History: 1 Diabetes History: 1 Stroke History: 0 Vascular Disease History: 1 Age Score: 2 Gender Score: 0             Physical Exam:    VS:  BP 134/78   Pulse 73   Ht 6' 1.5" (1.867 m)   Wt 258 lb (117 kg)   SpO2 97%   BMI 33.58 kg/m     Wt Readings from Last 3 Encounters:  08/20/22 258 lb (117 kg)  05/18/22 265 lb (120.2 kg)  01/12/22 262 lb (118.8 kg)    Constitutional:      Appearance: Healthy appearance. Not in distress.  Neck:     Vascular: JVD normal.  Pulmonary:     Effort: Pulmonary effort is normal.     Breath sounds: No wheezing. No rales.  Cardiovascular:     Normal rate. Irregularly irregular rhythm. Normal S1. Normal S2.      Murmurs: There is no murmur.     Comments: R groin w/o hematoma or bruit; L wrist w/o hematoma Edema:    Peripheral edema absent.  Abdominal:     Palpations: Abdomen is  soft.  Skin:    General: Skin is warm and dry.  Neurological:     General: No focal deficit present.     Mental Status: Alert and oriented to person, place and time.         ASSESSMENT & PLAN:   NSTEMI (non-ST elevated myocardial infarction) ([place]) History of remote myocardial infarction and CABG in 1997.  He was recent admitted with a non-STEMI and underwent CI with DES to the SVG-OM.  His LIMA-LAD and vein graft to the diagonal were both patent.  Vein graft to the RCA was chronically occluded.  He is currently doing well without chest pain to suggest angina.  We discussed the importance of pursuing cardiac rehabilitation.  He plans to start at [facility].  As he is on Eliquis, he will remain on aspirin and Plavix for 30 days post PCI.  Of note, he has skin cancer around his eye and will likely need Mohs surgery in the near future.  Ideally, he should not come off of antiplatelet therapy for minimum of 12 months post myocardial infarction.  If he comes off of Eliquis for the surgery, he would likely need to take aspirin in addition to Plavix (while off of Eliquis).   Continue aspirin 81 mg daily, Plavix and 5 mg daily, Lipitor 20 mg daily, Toprol-XL 25 mg daily DC aspirin after 09/12/2022 He is okay to start cardiac rehab Follow-up 6 weeks  Persistent atrial fibrillation ([place]) He remains in atrial fibrillation.  Rate is controlled.  He is not having any symptoms.  Restoring normal sinus rhythm improves long-term prognosis.  However, I am not certain about the timing of when to pursue cardioversion.  His atria are enlarged.  He will likely need an antiarrhythmic drug.  I will review further with Dr. Johnsie Cancel.  I suspect we should wait until after his skin cancer surgery.  At that point,  it may be worthwhile to have him see EP.  Continue Eliquis 5 mg twice daily.  Obtain BMET, CBC at 6-week follow-up.  Mixed hyperlipidemia He has a history of transaminitis with high-dose statin therapy.   Continue Lipitor 20 mg daily.  Recent lipids are optimal with LDL 46.  Essential hypertension, benign Blood pressure controlled.  Continue Toprol-XL to 5 mg daily, Micardis 80 mg daily.  Skin cancer of eyelid As noted, he will need Mohs surgery in the near future. With a recent myocardial infarction, he should remain on uninterrupted dual antiplatelet therapy with ASA and Plavix for a minimum of 1 year. However, he is also in atrial fibrillation and requires anticoagulation with Eliquis. Therefore, he is stopping ASA after one month and remaining on Eliquis + Plavix thereafter. If surgery must be done in the near future, we can hold his Eliquis but I think he would need to take ASA in addition to Plavix in the periop period (while off of Eliquis) to reduce the risk of stent thrombosis (then resume Eliquis + Plavix after surgery). If antiplatelet Rx has to be held, his surgery will need to be postponed for at least 6 mos (if possible).            Dispo:  Return in about 6 weeks (around 10/01/2022) for Routine Follow Up w/ Dr. Johnsie Cancel.   Medication Adjustments/Labs and Tests Ordered: Current medicines are reviewed at length with the patient today.  Concerns regarding medicines are outlined above.  Tests Ordered: Orders Placed This Encounter  Procedures   EKG 12-Lead   Medication Changes: No orders of the defined types were placed in this encounter.  Signed, Richardson Dopp, PA-C  08/20/2022 10:04 AM    [facility] [address], [place], [place]  [address] Phone: [phone]; Fax: [phone]

## 2022-08-20 ENCOUNTER — Ambulatory Visit: Payer: PPO | Attending: Physician Assistant | Admitting: Physician Assistant

## 2022-08-20 ENCOUNTER — Encounter: Payer: Self-pay | Admitting: Physician Assistant

## 2022-08-20 VITALS — BP 134/78 | HR 73 | Ht 73.5 in | Wt 258.0 lb

## 2022-08-20 DIAGNOSIS — E782 Mixed hyperlipidemia: Secondary | ICD-10-CM

## 2022-08-20 DIAGNOSIS — C44101 Unspecified malignant neoplasm of skin of unspecified eyelid, including canthus: Secondary | ICD-10-CM | POA: Diagnosis not present

## 2022-08-20 DIAGNOSIS — I214 Non-ST elevation (NSTEMI) myocardial infarction: Secondary | ICD-10-CM | POA: Diagnosis not present

## 2022-08-20 DIAGNOSIS — I10 Essential (primary) hypertension: Secondary | ICD-10-CM

## 2022-08-20 DIAGNOSIS — I4819 Other persistent atrial fibrillation: Secondary | ICD-10-CM

## 2022-08-20 HISTORY — DX: Unspecified malignant neoplasm of skin of unspecified eyelid, including canthus: C44.101

## 2022-08-20 NOTE — Assessment & Plan Note (Addendum)
As noted, he will need Mohs surgery in the near future. With a recent myocardial infarction, he should remain on uninterrupted dual antiplatelet therapy with ASA and Plavix for a minimum of 1 year. However, he is also in atrial fibrillation and requires anticoagulation with Eliquis. Therefore, he is stopping ASA after one month and remaining on Eliquis + Plavix thereafter. If surgery must be done in the near future, we can hold his Eliquis but I think he would need to take ASA in addition to Plavix in the periop period (while off of Eliquis) to reduce the risk of stent thrombosis (then resume Eliquis + Plavix after surgery). If antiplatelet Rx has to be held, his surgery will need to be postponed for at least 6 mos (if possible).

## 2022-08-20 NOTE — Patient Instructions (Addendum)
Medication Instructions:  Your physician recommends that you continue on your current medications as directed. Please refer to the Current Medication list given to you today.  YOU CAN STOP THE ASPIRIN AFTER 09/12/22  *If you need a refill on your cardiac medications before your next appointment, please call your pharmacy*   Lab Work: None ordered today  If you have labs (blood work) drawn today and your tests are completely normal, you will receive your results only by: [place] (if you have MyChart) OR A paper copy in the mail If you have any lab test that is abnormal or we need to change your treatment, we will call you to review the results.   Testing/Procedures: None ordered   Follow-Up: At [facility], you and your health needs are our priority.  As part of our continuing mission to provide you with exceptional heart care, we have created designated Provider Care Teams.  These Care Teams include your primary Cardiologist (physician) and Advanced Practice Providers (APPs -  Physician Assistants and Nurse Practitioners) who all work together to provide you with the care you need, when you need it.  We recommend signing up for the patient portal called "MyChart".  Sign up information is provided on this After Visit Summary.  MyChart is used to connect with patients for Virtual Visits (Telemedicine).  Patients are able to view lab/test results, encounter notes, upcoming appointments, etc.  Non-urgent messages can be sent to your provider as well.   To learn more about what you can do with MyChart, go to [email or web address].    Your next appointment:   6-8 week(s)  Dr. Kyla Balzarine RN will have to call you with this.  The format for your next appointment:   In Person  Provider:   Jenkins Rouge, MD     Other Instructions   Important Information About Sugar

## 2022-08-20 NOTE — Assessment & Plan Note (Signed)
He has a history of transaminitis with high-dose statin therapy.  Continue Lipitor 20 mg daily.  Recent lipids are optimal with LDL 46.

## 2022-08-20 NOTE — Assessment & Plan Note (Signed)
History of remote myocardial infarction and CABG in 1997.  He was recent admitted with a non-STEMI and underwent CI with DES to the SVG-OM.  His LIMA-LAD and vein graft to the diagonal were both patent.  Vein graft to the RCA was chronically occluded.  He is currently doing well without chest pain to suggest angina.  We discussed the importance of pursuing cardiac rehabilitation.  He plans to start at [facility].  As he is on Eliquis, he will remain on aspirin and Plavix for 30 days post PCI.  Of note, he has skin cancer around his eye and will likely need Mohs surgery in the near future.  Ideally, he should not come off of antiplatelet therapy for minimum of 12 months post myocardial infarction.  If he comes off of Eliquis for the surgery, he would likely need to take aspirin in addition to Plavix (while off of Eliquis).   Continue aspirin 81 mg daily, Plavix and 5 mg daily, Lipitor 20 mg daily, Toprol-XL 25 mg daily DC aspirin after 09/12/2022 He is okay to start cardiac rehab Follow-up 6 weeks

## 2022-08-20 NOTE — Assessment & Plan Note (Signed)
He remains in atrial fibrillation.  Rate is controlled.  He is not having any symptoms.  Restoring normal sinus rhythm improves long-term prognosis.  However, I am not certain about the timing of when to pursue cardioversion.  His atria are enlarged.  He will likely need an antiarrhythmic drug.  I will review further with Dr. Johnsie Cancel.  I suspect we should wait until after his skin cancer surgery.  At that point, it may be worthwhile to have him see EP.  Continue Eliquis 5 mg twice daily.  Obtain BMET, CBC at 6-week follow-up.

## 2022-08-20 NOTE — Assessment & Plan Note (Signed)
Blood pressure controlled.  Continue Toprol-XL to 5 mg daily, Micardis 80 mg daily.

## 2022-08-23 LAB — ECHOCARDIOGRAM COMPLETE
Calc EF: 63.1 %
S' Lateral: 3.8 cm
Single Plane A2C EF: 63.5 %
Single Plane A4C EF: 62.5 %

## 2022-08-25 ENCOUNTER — Ambulatory Visit (INDEPENDENT_AMBULATORY_CARE_PROVIDER_SITE_OTHER): Payer: PPO | Admitting: Family Medicine

## 2022-08-25 VITALS — BP 104/68 | HR 72 | Temp 97.0°F | Resp 18 | Ht 73.0 in | Wt 255.0 lb

## 2022-08-25 DIAGNOSIS — I119 Hypertensive heart disease without heart failure: Secondary | ICD-10-CM

## 2022-08-25 DIAGNOSIS — I4891 Unspecified atrial fibrillation: Secondary | ICD-10-CM

## 2022-08-25 DIAGNOSIS — I214 Non-ST elevation (NSTEMI) myocardial infarction: Secondary | ICD-10-CM

## 2022-08-25 DIAGNOSIS — E782 Mixed hyperlipidemia: Secondary | ICD-10-CM | POA: Diagnosis not present

## 2022-08-25 DIAGNOSIS — E6609 Other obesity due to excess calories: Secondary | ICD-10-CM | POA: Diagnosis not present

## 2022-08-25 DIAGNOSIS — Z23 Encounter for immunization: Secondary | ICD-10-CM | POA: Diagnosis not present

## 2022-08-25 DIAGNOSIS — Z6833 Body mass index (BMI) 33.0-33.9, adult: Secondary | ICD-10-CM

## 2022-08-25 DIAGNOSIS — E1142 Type 2 diabetes mellitus with diabetic polyneuropathy: Secondary | ICD-10-CM | POA: Diagnosis not present

## 2022-08-25 DIAGNOSIS — C44101 Unspecified malignant neoplasm of skin of unspecified eyelid, including canthus: Secondary | ICD-10-CM

## 2022-08-25 DIAGNOSIS — D696 Thrombocytopenia, unspecified: Secondary | ICD-10-CM

## 2022-08-25 NOTE — Progress Notes (Signed)
 Subjective:  Patient ID: Andre Shaffer, male    DOB: 03/07/1941  Age: 81 y.o. MRN: [id number]  Chief Complaint  Patient presents with   Diabetes   Hypertension    HPI Diabetes:  Complications: neuropathy.  Glucose checking: Checks sugars fasting daily Glucose logs: 120-190s Hypoglycemia: some. Most recent A1C: 6.8 Current medications: Metformin 1000 mg take 1 tablet BID, Novolog insulin injection 70/30 60 U in am and 40 U before supper, Gabapentin 300 mg 1 capsule twice daily. Last Eye Exam: 08/09/2021 - [facility] and they stated that the patient has a wrinkle in his right eye and it would be monitored closely. He was instructed to call them back if he had experienced any significant eyesight changes. Following up in 6 months.  Foot checks: daily.   Hyperlipidemia: Current medications: Zetia 10 mg 1 tablet daily. Atorvastatin 20 mg daily.   Hypertension/CAD: Current medications: Metoprolol 25 mg take 1 tablet daily, Telmisartan 80 mg take 1 tablet daily.  Diet: healthy.  Exercise: no   .. Hospital follow up: NSTEMI, New onset Atrial fibrillation, Hyperlipidemia, diabetes.   Troponins high. LHC stent. Went to [facility] initially on 08/11/2022 and was initially evaluated and then transferred to [facility] for LHC.  Admitted on 08/12/2022. Discharged 08/13/2022.  Prox RCA lesion is 100% stenosed.   Origin to Prox Graft lesion is 100% stenosed.   Ost LAD to Mid LAD lesion is 60% stenosed.   Mid LAD lesion is 100% stenosed.   Mid Cx to Dist Cx lesion is 100% stenosed.   Mid Graft lesion is 99% stenosed.   A drug-eluting stent was successfully placed using a SYNERGY XD 3.0X16.   Post intervention, there is a 0% residual stenosis.   SVG graft was not visualized.   LIMA graft was visualized by angiography.   SVG graft was visualized by angiography.   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 The mid and distal LAD fills from the patent LIMA graft. There is diffuse disease in the LAD beyond graft insertion.  Chronic occlusion distal Circumflex. Patent SVG to OM with severe disease in distal body of SVG to OM Chronic occlusion of the proximal RCA. Occluded vein graft to RCA. The RCA fills from left to right collaterals.  LVEDP=16-18 mmHg   Recommendations: Continue DAPT with ASA and Plavix for one month then stop ASA. Discharged on eliquis 5 mg twice daily for new onset atrial fibrillation. Continue beta blocker and statin.   No more chest pressure. No worsenening swelling in legs. DYSPNEA ON EXERTION with stairs. Scheduled to go to Cardiopulmonary rehab.   Saw EYE doctor in [place] for knot under eye. Referred to [place] eye surgeon. Was a Chalazion on left eye. 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 Eye surgeon referred to Dr. Williams. Unable to stop blood thinners for several months.    Current Outpatient Medications on File Prior to Visit  Medication Sig Dispense Refill   apixaban (ELIQUIS) 5 MG TABS tablet Take 1 tablet (5 mg total) by mouth 2 (two) times daily. 60 tablet 2   aspirin 81 MG chewable tablet Chew 1 tablet (81 mg total) by mouth daily. 30 tablet 0   atorvastatin (LIPITOR) 20 MG tablet Take 1 tablet (20 mg total) by mouth daily. 90 tablet 3     clopidogrel (PLAVIX) 75 MG tablet Take 1 tablet (75 mg total) by mouth daily with breakfast. 30 tablet 2   Cyanocobalamin 1000 MCG/ML LIQD Take 2,500 mcg by mouth See admin instructions. Place 0.5 dropperful (2,500 mcg) sublingually daily     gabapentin (NEURONTIN) 300 MG capsule Take 1 capsule (300 mg total) by mouth 2 (two) times daily. 180 capsule 3   glucose blood test strip Use as instructed, Check blood glucose twice daily. 100 each 12   glucose blood test strip      insulin aspart protamine- aspart (NOVOLOG MIX 70/30) (70-30) 100 UNIT/ML injection  Inject 0.4-0.6 mLs (40-60 Units total) into the skin See admin instructions. Inject 60 units into the skin before breakfast and 40 units units before supper/evening meal 10 mL 0   Insulin Pen Needle (NOVOFINE) 30G X 8 MM MISC Inject 10 each into the skin as needed. E11.42 100 each 2   Lancets (ONETOUCH ULTRASOFT) lancets Use as instructed, Check blood glucose twice daily. 100 each 12   metFORMIN (GLUCOPHAGE) 1000 MG tablet TAKE 1 TABLET BY MOUTH 2 TIMES DAILY. 180 tablet 0   metoprolol succinate (TOPROL-XL) 25 MG 24 hr tablet TAKE ONE (1) TABLET ONCE DAILY (Patient taking differently: Take 25 mg by mouth daily.) 90 tablet 3   telmisartan (MICARDIS) 80 MG tablet TAKE ONE (1) TABLET BY MOUTH ONCE DAILY (Patient taking differently: Take 80 mg by mouth daily.) 90 tablet 3   Blood Glucose Monitoring Suppl (ONETOUCH VERIO) w/Device KIT Use as instructed. Check blood glucose twice daily. E11.42 1 kit 0   Current Facility-Administered Medications on File Prior to Visit  Medication Dose Route Frequency Provider Last Rate Last Admin   Insulin Pen Needle (NOVOFINE) 10 each  1 packet Subcutaneous PRN Saige Busby, MD       Past Medical History:  Diagnosis Date   Abnormal liver function    Arthritis    Atrial fibrillation ([place]) 08/12/2022   Eliquis   Coronary Artery Disease    s/p MI in 1997 followed by CABG // NSTEMI 08/2022 >> s/p DES to S-OM [L-LAD patent, S-Dx patent, S-RCA CTO] // Echo 2015: EF 45-50, inf HK // Echo 10/23: EF 55-60, no WMA   Diabetes mellitus without complication (HCC)    GERD (gastroesophageal reflux disease)    Hyperlipemia    Hypertension    MI (myocardial infarction) ([place])    SCC (squamous cell carcinoma), arm, left    SCC (squamous cell carcinoma), arm, 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   Past Surgical History:  Procedure Laterality Date   ANKLE SURGERY  93,03   rt and lt    CHOLECYSTECTOMY  10/19/2012   Procedure: LAPAROSCOPIC CHOLECYSTECTOMY WITH INTRAOPERATIVE CHOLANGIOGRAM;  Surgeon:  Ralene Ok, MD;  Location: [facility];  Service: General;  Laterality: N/A;   [place]   CORONARY STENT INTERVENTION N/A 08/12/2022   Procedure: CORONARY STENT INTERVENTION;  Surgeon: Burnell Blanks, MD;  Location: [facility];  Service: Cardiovascular;  Laterality: N/A;   ENDOSCOPIC RETROGRADE CHOLANGIOPANCREATOGRAPHY (ERCP) WITH PROPOFOL N/A 07/16/2021   Procedure: ENDOSCOPIC RETROGRADE CHOLANGIOPANCREATOGRAPHY (ERCP) WITH PROPOFOL;  Surgeon: Milus Banister, MD;  Location: [facility];  Service: Endoscopy;  Laterality: N/A;   LEFT HEART CATH AND CORS/GRAFTS ANGIOGRAPHY N/A 08/12/2022   Procedure: LEFT HEART CATH AND CORS/GRAFTS ANGIOGRAPHY;  Surgeon: Burnell Blanks, MD;  Location: [facility];  Service: Cardiovascular;  Laterality: N/A;   REMOVAL OF STONES  07/16/2021   Procedure: REMOVAL OF STONES;  Surgeon: Milus Banister, MD;  Location: [facility];  Service: Endoscopy;;   SKIN CANCER EXCISION  12/2020   Squamous Cell Cancer   SPHINCTEROTOMY  07/16/2021   Procedure: SPHINCTEROTOMY;  Surgeon: Milus Banister, MD;  Location: [facility];  Service: Endoscopy;;   TIBIA FRACTURE SURGERY      History reviewed. No pertinent family history. Social History   Socioeconomic History   Marital status: Married    Spouse name: Not on file   Number of children: Not on file   Years of education: Not on file   Highest education level: Not on file  Occupational History   Not on file  Tobacco Use   Smoking status: Former    Packs/day: 1.00    Years: 20.00    Total pack years: 20.00    Types: Cigarettes    Quit date: 10/19/1975    Years since quitting: 46.8   Smokeless tobacco: Never  Substance and Sexual Activity   Alcohol use: No   Drug use: No   Sexual activity: Not on file  Other Topics Concern   Not on file  Social History Narrative   Not on file   Social Determinants of Health    Financial Resource Strain: Low Risk  (08/16/2022)   Overall Financial Resource Strain (CARDIA)    Difficulty of Paying Living Expenses: Not hard at all  Food Insecurity: No Food Insecurity (08/16/2022)   Hunger Vital Sign    Worried About Running Out of Food in the Last Year: Never true    Tat [place] in the Last Year: Never true  Transportation Needs: No Transportation Needs (08/16/2022)   PRAPARE - Hydrologist (Medical): No    Lack of Transportation (Non-Medical): No  Physical Activity: Not on file  Stress: Not on file  Social Connections: Not on file    Review of Systems  Constitutional:  Negative for chills, fatigue and fever.  HENT:  Negative for congestion, ear pain and sore throat.   Respiratory:  Negative for cough and shortness of breath.   Cardiovascular:  Negative for chest pain.  Gastrointestinal:  Positive for constipation, diarrhea and vomiting (x 1 in cardiology office 1-2 weeks ago.). Negative for abdominal pain and nausea.  Endocrine: Negative for polydipsia, polyphagia and polyuria.  Genitourinary:  Negative for dysuria and frequency.  Musculoskeletal:  Negative for arthralgias and myalgias.  Neurological:  Negative for dizziness and headaches.  Psychiatric/Behavioral:  Negative for dysphoric mood.        No dysphoria     Objective:  BP 104/68   Pulse 72   Temp (!) 97 F (36.1 C)   Resp 18   Ht 6' 1" (1.854 m)   Wt 255 lb (115.7 kg)   BMI 33.64 kg/m      08/25/2022    9:22 AM 08/20/2022    8:14 AM 08/13/2022    9:45 AM  BP/Weight  Systolic BP 993 570 177  Diastolic BP 68 78 80  Wt. (Lbs) 255 258   BMI 33.64 kg/m2 33.58 kg/m2     Physical Exam Vitals reviewed.  Constitutional:      Appearance: Normal appearance.  Neck:     Vascular: No carotid bruit.  Cardiovascular:     Rate and Rhythm: Normal rate and regular rhythm.     Pulses: Normal pulses.     Heart sounds: Normal heart sounds.  Pulmonary:  Effort: Pulmonary effort is normal.     Breath sounds: Normal breath sounds. No wheezing, rhonchi or rales.  Abdominal:     General: Bowel sounds are normal.     Palpations: Abdomen is soft.     Tenderness: There is no abdominal tenderness.  Neurological:     Mental Status: He is alert.  Psychiatric:        Mood and Affect: Mood normal.        Behavior: Behavior normal.     Diabetic Foot Exam - Simple   Simple Foot Form  08/25/2022 11:09 PM  Visual Inspection No deformities, no ulcerations, no other skin breakdown bilaterally: Yes Sensation Testing See comments: Yes Pulse Check Posterior Tibialis and Dorsalis pulse intact bilaterally: Yes Comments Decreased sensation      Lab Results  Component Value Date   WBC 8.7 08/13/2022   HGB 12.1 (L) 08/13/2022   HCT 36.0 (L) 08/13/2022   PLT 114 (L) 08/13/2022   GLUCOSE 240 (H) 08/13/2022   CHOL 91 (L) 05/18/2022   TRIG 63 05/18/2022   HDL 31 (L) 05/18/2022   LDLCALC 46 05/18/2022   ALT 10 05/18/2022   AST 16 05/18/2022   NA 136 08/13/2022   K 4.1 08/13/2022   CL 106 08/13/2022   CREATININE 1.20 08/13/2022   BUN 17 08/13/2022   CO2 22 08/13/2022   TSH 4.100 05/18/2022   HGBA1C 6.8 (H) 08/13/2022   MICROALBUR 80 09/10/2021      Assessment & Plan:   Problem List Items Addressed This Visit       Cardiovascular and Mediastinum   Hypertensive heart disease without congestive heart failure    The current medical regimen is effective;  continue present plan and medications. Management per specialist.        NSTEMI (non-ST elevated myocardial infarction) (HCC)    S/p  LHC with DES to SVG-OM.  Aspirin until 09/12/2022.  Continue eliquis, plavix, betablocker and statin.  Start cardiac rehab.  Management per specialist.        Atrial fibrillation (HCC)    Continue eliquis and metoprolol        Endocrine   Diabetic polyneuropathy associated with type 2 diabetes mellitus (HCC)    Control: good Recommend  check sugars fasting daily. Recommend check feet daily. Recommend annual eye exams. Medicines: Metformin 1000 mg take 1 tablet BID, Novolog insulin injection 70/30 60 U in am and 40 U before supper, Gabapentin 300 mg 1 capsule twice daily. Continue to work on eating a healthy diet and exercise.  Labs reviewed from [facility].        Musculoskeletal and Integument   Skin cancer of eyelid    Will need MOHs surgery with Dr. Williams. Cardiology to determine when may stop blood thinners for surgery due to recent NSTEMI and New onset Atrial fibrillation.        Hematopoietic and Hemostatic   Thrombocytopenia (HCC)    Stable.        Other   Mixed hyperlipidemia (Chronic)    Well controlled.  No changes to medicines. Continue lipitor 20 mg daily and zetia 10 mg daily.  Restarted lipitor after patient had ercp with removal of intrahepatic ductal blockages resolved. Has not had a recurrence of his elevated transaminases. Continue to work on eating a healthy diet and exercise.  Labs reviewed.        Class 1 obesity due to excess calories with serious comorbidity and body mass index (BMI) of 33.0 to   33.9 in adult    Recommend continue to work on eating healthy diet and exercise. Going to start cardiopulmonary rehab.       Other Visit Diagnoses     Need for influenza vaccination    -  Primary   Relevant Orders   Flu Vaccine QUAD High Dose(Fluad) (Completed)     .  No orders of the defined types were placed in this encounter.   Orders Placed This Encounter  Procedures   Flu Vaccine QUAD High Dose(Fluad)     Follow-up: Return in about 3 months (around 11/25/2022) for chronic follow up.  An After Visit Summary was printed and given to the patient.  Rochel Brome, MD [facility] [phone]

## 2022-08-31 ENCOUNTER — Encounter: Payer: Self-pay | Admitting: Family Medicine

## 2022-08-31 NOTE — Assessment & Plan Note (Signed)
Stable

## 2022-08-31 NOTE — Assessment & Plan Note (Signed)
Control: good Recommend check sugars fasting daily. Recommend check feet daily. Recommend annual eye exams. Medicines: Metformin 1000 mg take 1 tablet BID, Novolog insulin injection 70/30 60 U in am and 40 U before supper, Gabapentin 300 mg 1 capsule twice daily. Continue to work on eating a healthy diet and exercise.  Labs reviewed from [facility].

## 2022-08-31 NOTE — Assessment & Plan Note (Signed)
Well controlled.  No changes to medicines. Continue lipitor 20 mg daily and zetia 10 mg daily.  Restarted lipitor after patient had ercp with removal of intrahepatic ductal blockages resolved. Has not had a recurrence of his elevated transaminases. Continue to work on eating a healthy diet and exercise.  Labs reviewed.

## 2022-08-31 NOTE — Assessment & Plan Note (Signed)
The current medical regimen is effective;  continue present plan and medications. Management per specialist.   

## 2022-08-31 NOTE — Assessment & Plan Note (Addendum)
S/p  LHC with DES to SVG-OM.  Aspirin until 09/12/2022.  Continue eliquis, plavix, betablocker and statin.  Start cardiac rehab.  Management per specialist.

## 2022-08-31 NOTE — Assessment & Plan Note (Signed)
Recommend continue to work on eating healthy diet and exercise. Going to start cardiopulmonary rehab.

## 2022-08-31 NOTE — Assessment & Plan Note (Signed)
Continue eliquis and metoprolol

## 2022-08-31 NOTE — Assessment & Plan Note (Signed)
Will need MOHs surgery with Dr. Jimmye Norman. Cardiology to determine when may stop blood thinners for surgery due to recent NSTEMI and New onset Atrial fibrillation.

## 2022-09-02 NOTE — Addendum Note (Signed)
Addended by: Drue Novel I on: 09/02/2022 10:42 AM   Modules accepted: Orders

## 2022-09-06 ENCOUNTER — Telehealth: Payer: Self-pay

## 2022-09-06 ENCOUNTER — Other Ambulatory Visit: Payer: Self-pay | Admitting: Family Medicine

## 2022-09-06 DIAGNOSIS — I119 Hypertensive heart disease without heart failure: Secondary | ICD-10-CM

## 2022-09-06 DIAGNOSIS — I4891 Unspecified atrial fibrillation: Secondary | ICD-10-CM

## 2022-09-06 DIAGNOSIS — Z794 Long term (current) use of insulin: Secondary | ICD-10-CM

## 2022-09-06 NOTE — Telephone Encounter (Signed)
Patient's wife Inez Catalina called stating that since the last time they seen you, they had tried stopping the metformin and ever since stopping it his GI symptoms have resolved. She states that doing the insulin has went well but she still feels that he needs another medication to take with the insulin. She stated that yall had discussed [place] and she states that it did work in the past the only problem was that it was a Tier 3 medication which it was costing 50$ or so. She states yall discussed that and you were going to refer them to the pharmacist to have help trying to get a lower cost not only for the [place] but for his Eliquis as well. Please advise.

## 2022-09-07 DIAGNOSIS — I4891 Unspecified atrial fibrillation: Secondary | ICD-10-CM | POA: Diagnosis not present

## 2022-09-07 DIAGNOSIS — Z955 Presence of coronary angioplasty implant and graft: Secondary | ICD-10-CM | POA: Diagnosis not present

## 2022-09-07 DIAGNOSIS — Z7901 Long term (current) use of anticoagulants: Secondary | ICD-10-CM | POA: Diagnosis not present

## 2022-09-07 DIAGNOSIS — I252 Old myocardial infarction: Secondary | ICD-10-CM | POA: Diagnosis not present

## 2022-09-07 DIAGNOSIS — Z7982 Long term (current) use of aspirin: Secondary | ICD-10-CM | POA: Diagnosis not present

## 2022-09-07 DIAGNOSIS — E785 Hyperlipidemia, unspecified: Secondary | ICD-10-CM | POA: Diagnosis not present

## 2022-09-07 DIAGNOSIS — Z951 Presence of aortocoronary bypass graft: Secondary | ICD-10-CM | POA: Diagnosis not present

## 2022-09-07 DIAGNOSIS — Z79899 Other long term (current) drug therapy: Secondary | ICD-10-CM | POA: Diagnosis not present

## 2022-09-07 DIAGNOSIS — I10 Essential (primary) hypertension: Secondary | ICD-10-CM | POA: Diagnosis not present

## 2022-09-07 NOTE — Telephone Encounter (Signed)
Patient's wife was notified.  She has farxiga 10 mg on hand that he was given in the past.  She will give him 5 mg as ordered.  She thinks she has enough for 2 months.

## 2022-09-10 ENCOUNTER — Ambulatory Visit (INDEPENDENT_AMBULATORY_CARE_PROVIDER_SITE_OTHER): Payer: PPO

## 2022-09-10 ENCOUNTER — Telehealth: Payer: Self-pay

## 2022-09-10 DIAGNOSIS — E1165 Type 2 diabetes mellitus with hyperglycemia: Secondary | ICD-10-CM

## 2022-09-10 DIAGNOSIS — I119 Hypertensive heart disease without heart failure: Secondary | ICD-10-CM

## 2022-09-10 DIAGNOSIS — I4891 Unspecified atrial fibrillation: Secondary | ICD-10-CM

## 2022-09-10 NOTE — Chronic Care Management (AMB) (Signed)
Chronic Care Management   CCM RN Visit Note  09/10/2022 Name: Andre Shaffer MRN: [id number] DOB: Jun 27, 1941  Subjective: Andre Shaffer is a 81 y.o. year old male who is a primary care patient of Cox, Kirsten, MD. The patient was referred to the Chronic Care Management team for assistance with care management needs subsequent to provider initiation of CCM services and plan of care.    Today's Visit:  Engaged with patient by telephone for initial visit.     SDOH Interventions Today    Flowsheet Row Most Recent Value  SDOH Interventions   Food Insecurity Interventions Intervention Not Indicated  Housing Interventions Intervention Not Indicated  Transportation Interventions Intervention Not Indicated  Utilities Interventions Intervention Not Indicated  Alcohol Usage Interventions Intervention Not Indicated (Score <7)  Financial Strain Interventions Intervention Not Indicated  Physical Activity Interventions Intervention Not Indicated, Cardiac Rehab  Stress Interventions Intervention Not Indicated  Social Connections Interventions Intervention Not Indicated         Goals Addressed             This Visit's Progress    CCM Expected Outcome:  Monitor, Self-Manage and Reduce Symptoms of Afib       Current Barriers:  Knowledge Deficits related to 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 Collaborated with PharmD regarding patient care and plan. Counseled on increased risk of stroke due to Afib and benefits of anticoagulation for stroke prevention           Reviewed importance of adherence to anticoagulant exactly as prescribed Advised patient to discuss changes in his AFIB, questions or concerns about AFIB or heart conditions with provider Counseled on bleeding risk associated with AFIB and importance of  self-monitoring for signs/symptoms of bleeding Counseled on avoidance of NSAIDs due to increased bleeding risk with anticoagulants Counseled on importance of regular laboratory monitoring as prescribed Counseled on seeking medical attention after a head injury or if there is blood in the urine/stool Afib action plan reviewed Screening for signs and symptoms of depression related to chronic disease state Assessed social determinant of health barriers  Symptom Management: Take medications as prescribed   Attend all scheduled provider appointments Call provider office for new concerns or questions  call the Suicide and Crisis Lifeline: 988 call the [place] National Suicide Prevention Lifeline: 743 110 1662 or TTY: 6208453029 TTY (260)409-3926) to talk to a trained counselor call 1-800-273-TALK (toll free, 24 hour hotline) if experiencing a Mental Health or [place]  - make a plan to exercise regularly - make a plan to eat healthy - keep all lab appointments - take medicine as prescribed  Follow Up Plan: Telephone follow up appointment with care management team member scheduled for: 10-15-2022 at 1145 am       CCM Expected Outcome:  Monitor, Self-Manage and Reduce Symptoms of Diabetes       Current Barriers:  Knowledge Deficits related to monitoring blood sugars and keeping blood sugars in an acceptable range Care Coordination needs related to cost constraints for medications with the patient needing a PA for Fargixa in a patient with DM Chronic Disease Management support and education needs related to Effective management of DM Financial Constraints.   Planned Interventions: Provided education to patient about basic DM disease process; Reviewed medications with patient and discussed importance of medication adherence;  Reviewed prescribed diet with patient heart healthy/ADA diet ; Counseled on importance of regular laboratory monitoring as prescribed;         Discussed plans with patient for ongoing care management follow up and provided patient with direct contact information for care management team;      Provided patient with written educational materials related to hypo and hyperglycemia and importance of correct treatment;       Reviewed scheduled/upcoming provider appointments including: 12-02-2022 with the pcp;         Advised patient, providing education and rationale, to check cbg twice daily and when you have symptoms of low or high blood sugar and record        call provider for findings outside established parameters;       Referral made to pharmacy team for assistance with PA needs related to fargixa. The patient went by the office today and picked up samples of Jardiance 10 mg until he can get PA for Jeani Hawking D consult pending;       Review of patient status, including review of consultants reports, relevant laboratory and other test results, and medications completed;       Advised patient to discuss changes in DM health and well being with provider;      Screening for signs and symptoms of depression related to chronic disease state;        Assessed social determinant of health barriers;         Symptom Management: Take medications as prescribed   Attend all scheduled provider appointments Call provider office for new concerns or questions  call the Suicide and Crisis Lifeline: 988 call the [place] National Suicide Prevention Lifeline: 480-520-3862 or TTY: (843) 338-9701 TTY 778-485-1583) to talk to a trained counselor call 1-800-273-TALK (toll free, 24 hour hotline) if experiencing a Mental Health or [place]  keep appointment with eye doctor check feet daily for cuts, sores or redness trim toenails straight across wash and dry feet carefully every day wear comfortable, cotton socks wear comfortable, well-fitting shoes  Follow Up Plan: Telephone follow up appointment with care management team member scheduled  for: 10-15-2022 at 1145 am       CCM Expected Outcome:  Monitor, Self-Manage, and Reduce Symptoms of Hypertension       Current Barriers:  Knowledge Deficits related to monitoring for changes in HTN and heart health and preventing further complications from recent NSTEMI in October with hospitalization Care Coordination needs related to cardiac rehab continuation  in a patient with post NSTEMI and HTN Chronic Disease Management support and education needs related to effective management of HTN  BP Readings from Last 3 Encounters:  08/25/22 104/68  08/20/22 134/78  08/13/22 (!) 143/80     Planned Interventions: Evaluation of current treatment plan related to hypertension self management and patient's adherence to plan as established by provider;   Provided education to patient re: stroke prevention, s/s of heart attack and stroke. Patient had a NSTEMI in October. Is doing well post hospitalization and heart catheterization. Patient is currently participating in cardiac rehab, first session today; Reviewed prescribed diet heart healthy/ADA diet  Reviewed medications with patient and discussed importance of compliance. Pharm D referral pending;  Discussed plans with patient for ongoing care management follow up and provided patient with direct contact information for care management team; Advised patient, providing education and rationale, to monitor blood pressure daily and record, calling PCP for findings outside established parameters;  Reviewed scheduled/upcoming provider appointments including:  10-12-2022 with cardiologist and pcp on 12-02-2022 Advised patient to discuss changes in HTN or heart health with provider; Provided education on prescribed diet heart healthy/ADA diet ;  Discussed complications of poorly controlled blood pressure such as heart disease, stroke, circulatory complications, vision complications, kidney impairment, sexual dysfunction;  Screening for signs and symptoms of  depression related to chronic disease state;  Assessed social determinant of health barriers;   Symptom Management: Take medications as prescribed   Attend all scheduled provider appointments Call pharmacy for medication refills 3-7 days in advance of running out of medications Call provider office for new concerns or questions  call the Suicide and Crisis Lifeline: 988 call the [place] National Suicide Prevention Lifeline: 226-043-8666 or TTY: 641-044-7197 TTY (207)726-5545) to talk to a trained counselor call 1-800-273-TALK (toll free, 24 hour hotline) if experiencing a Mental Health or [place]  check blood pressure 3 times per week write blood pressure results in a log or diary learn about high blood pressure keep a blood pressure log take blood pressure log to all doctor appointments call doctor for signs and symptoms of high blood pressure develop an action plan for high blood pressure keep all doctor appointments take medications for blood pressure exactly as prescribed report new symptoms to your doctor  Follow Up Plan: Telephone follow up appointment with care management team member scheduled for: 10-15-2022 at 1145 am          Plan:Telephone follow up appointment with care management team member scheduled for:  10-15-2022 at 1145 am  Noreene Larsson RN, MSN, CCM RN Care Manager  Chronic Care Management Direct Number: [phone]

## 2022-09-10 NOTE — Telephone Encounter (Signed)
Patient wife called and stated that she can't cut fargixa in half. SO she wanted to know should she give him 10 mg.  Patient has samples of  jardiance 10 mg waiting on him in office until he can get PA for [place].

## 2022-09-10 NOTE — Progress Notes (Signed)
  Chronic Care Management   Note  09/10/2022 Name: [name] MRN: [id number] DOB: [birth year]  Andre Shaffer is a 81 y.o. year old male who is a primary care patient of Cox, Kirsten, MD. I reached out to Andre Shaffer by phone today in response to a referral sent by Andre Shaffer's PCP.  [name]  agreedto scheduling an appointment with the CCM Pharmacist   Follow up plan: Patient agreed to scheduled appointment with Pharmacist on 09/15/2022(date/time).   Noreene Larsson, [place], [place] [address] Direct Dial: [phone] [email or web address]

## 2022-09-10 NOTE — Patient Instructions (Signed)
Please call the care guide team at [phone] if you need to cancel or reschedule your appointment.   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 Collaborated with PharmD regarding patient care and plan. Counseled on increased risk of stroke due to Afib and benefits of anticoagulation for stroke prevention           Reviewed importance of adherence to anticoagulant exactly as prescribed Advised patient to discuss changes in his AFIB, questions or concerns about AFIB or heart conditions with provider Counseled on bleeding risk associated with AFIB and importance of self-monitoring for signs/symptoms of bleeding Counseled on avoidance of NSAIDs due to increased bleeding risk with anticoagulants Counseled on importance of regular laboratory monitoring as prescribed Counseled on seeking medical attention after a head injury or if there is blood in the urine/stool Afib action plan reviewed Screening for signs and symptoms of depression related to chronic disease state Assessed social determinant of health barriers  Symptom Management: Take medications as prescribed   Attend all scheduled provider appointments Call provider office for new concerns or questions  call  the Suicide and Crisis Lifeline: 988 call the [place] National Suicide Prevention Lifeline: 903-423-1750 or TTY: 2505045759 TTY 734-857-2848) to talk to a trained counselor call 1-800-273-TALK (toll free, 24 hour hotline) if experiencing a Mental Health or [place]  - make a plan to exercise regularly - make a plan to eat healthy - keep all lab appointments - take medicine as prescribed  Follow Up Plan: Telephone follow up appointment with care management team member scheduled for: 10-15-2022 at 1145 am       CCM Expected Outcome:  Monitor, Self-Manage and Reduce Symptoms of Diabetes       Current Barriers:  Knowledge Deficits related to monitoring blood sugars and keeping blood sugars in an acceptable range Care Coordination needs related to cost constraints for medications with the patient needing a PA for Fargixa in a patient with DM Chronic Disease Management support and education needs related to Effective management of DM Financial Constraints.   Planned Interventions: Provided education to patient about basic DM disease process; Reviewed medications with patient and discussed importance of medication adherence;        Reviewed prescribed diet with patient heart healthy/ADA diet ; Counseled on importance of regular laboratory monitoring as prescribed;        Discussed plans with patient for ongoing care management follow up and provided patient with direct contact information for care management team;      Provided patient with written educational materials related to hypo and hyperglycemia and importance of correct treatment;       Reviewed scheduled/upcoming provider appointments including: 12-02-2022 with the pcp;  Advised patient, providing education and rationale, to check cbg twice daily and when you have symptoms of low or high blood sugar and record        call provider for findings outside established parameters;       Referral made to pharmacy team  for assistance with PA needs related to fargixa. The patient went by the office today and picked up samples of Jardiance 10 mg until he can get PA for Jeani Hawking D consult pending;       Review of patient status, including review of consultants reports, relevant laboratory and other test results, and medications completed;       Advised patient to discuss changes in DM health and well being with provider;      Screening for signs and symptoms of depression related to chronic disease state;        Assessed social determinant of health barriers;         Symptom Management: Take medications as prescribed   Attend all scheduled provider appointments Call provider office for new concerns or questions  call the Suicide and Crisis Lifeline: 988 call the [place] National Suicide Prevention Lifeline: (717)664-8391 or TTY: 680-185-9997 TTY 650-874-7271) to talk to a trained counselor call 1-800-273-TALK (toll free, 24 hour hotline) if experiencing a Mental Health or [place]  keep appointment with eye doctor check feet daily for cuts, sores or redness trim toenails straight across wash and dry feet carefully every day wear comfortable, cotton socks wear comfortable, well-fitting shoes  Follow Up Plan: Telephone follow up appointment with care management team member scheduled for: 10-15-2022 at 1145 am       CCM Expected Outcome:  Monitor, Self-Manage, and Reduce Symptoms of Hypertension       Current Barriers:  Knowledge Deficits related to monitoring for changes in HTN and heart health and preventing further complications from recent NSTEMI in October with hospitalization Care Coordination needs related to cardiac rehab continuation  in a patient with post NSTEMI and HTN Chronic Disease Management support and education needs related to effective management of HTN  BP Readings from Last 3 Encounters:  08/25/22 104/68  08/20/22 134/78  08/13/22 (!) 143/80     Planned  Interventions: Evaluation of current treatment plan related to hypertension self management and patient's adherence to plan as established by provider;   Provided education to patient re: stroke prevention, s/s of heart attack and stroke. Patient had a NSTEMI in October. Is doing well post hospitalization and heart catheterization. Patient is currently participating in cardiac rehab, first session today; Reviewed prescribed diet heart healthy/ADA diet  Reviewed medications with patient and discussed importance of compliance. Pharm D referral pending;  Discussed plans with patient for ongoing care management follow up and provided patient with direct contact information for care management team; Advised patient, providing education and rationale, to monitor blood pressure daily and record, calling PCP for findings outside established parameters;  Reviewed scheduled/upcoming provider appointments including: 10-12-2022 with cardiologist and pcp on 12-02-2022 Advised patient to discuss changes in HTN or heart health with provider; Provided education on prescribed diet heart healthy/ADA diet ;  Discussed complications of poorly controlled blood pressure such as heart disease, stroke, circulatory complications, vision complications, kidney impairment, sexual dysfunction;  Screening for signs and symptoms of depression related to chronic disease state;  Assessed social determinant of health barriers;   Symptom Management: Take medications as prescribed   Attend all scheduled provider appointments Call pharmacy for medication refills 3-7  days in advance of running out of medications Call provider office for new concerns or questions  call the Suicide and Crisis Lifeline: 988 call the [place] National Suicide Prevention Lifeline: 513-842-6550 or TTY: 564-102-4355 TTY 640-567-7601) to talk to a trained counselor call 1-800-273-TALK (toll free, 24 hour hotline) if experiencing a Mental Health or [place]  check blood pressure 3 times per week write blood pressure results in a log or diary learn about high blood pressure keep a blood pressure log take blood pressure log to all doctor appointments call doctor for signs and symptoms of high blood pressure develop an action plan for high blood pressure keep all doctor appointments take medications for blood pressure exactly as prescribed report new symptoms to your doctor  Follow Up Plan: Telephone follow up appointment with care management team member scheduled for: 10-15-2022 at 1145 am          The patient verbalized understanding of instructions, educational materials, and care plan provided today and DECLINED offer to receive copy of patient instructions, educational materials, and care plan.   Telephone follow up appointment with care management team member scheduled for: 10-15-2022 at 1145 am

## 2022-09-10 NOTE — Chronic Care Management (AMB) (Signed)
Chronic Care Management Provider Comprehensive Care Plan    09/10/2022 Name: Andre Shaffer MRN: [id number] DOB: [birth year]  Referral to Chronic Care Management (CCM) services was placed by Provider:  Dr. Rochel Brome on Date: 09-06-2022.  Chronic Condition 1: HTN Provider Assessment and Plan Current medications: Metoprolol 25 mg take 1 tablet daily, Telmisartan 80 mg take 1 tablet daily.   Diet: healthy.  Exercise: no   Expected Outcome/Goals Addressed This Visit (Provider CCM goals/Provider Assessment and plan   CCM (HYPERTENSION)  EXPECTED OUTCOME:  MONITOR,SELF- MANAGE AND REDUCE SYMPTOMS OF HYPERTENSION   Symptom Management Condition 1: Take all medications as prescribed Attend all scheduled provider appointments Call provider office for new concerns or questions  call the Suicide and Crisis Lifeline: 988 call the [place] National Suicide Prevention Lifeline: 660-391-3372 or TTY: 816-302-2385 TTY 9780844928) to talk to a trained counselor call 1-800-273-TALK (toll free, 24 hour hotline) if experiencing a Mental Health or [place]  check blood pressure 3 times per week write blood pressure results in a log or diary learn about high blood pressure keep a blood pressure log take blood pressure log to all doctor appointments call doctor for signs and symptoms of high blood pressure keep all doctor appointments take medications for blood pressure exactly as prescribed report new symptoms to your doctor  Chronic Condition 2: DM Provider Assessment and Plan Control: good Recommend check sugars fasting daily. Recommend check feet daily. Recommend annual eye exams. Medicines: Metformin 1000 mg take 1 tablet BID, Novolog insulin injection 70/30 60 U in am and 40 U before supper, Gabapentin 300 mg 1 capsule twice daily. Continue to work on eating a healthy diet and exercise.  Labs reviewed from [facility].     Expected Outcome/Goals Addressed This Visit  (Provider CCM goals/Provider Assessment and plan   CCM (DIABETES) EXPECTED OUTCOME: MONITOR, SELF-MANAGE AND REDUCE SYMPTOMS OF DIABETES   Symptom Management Condition 2: Take all medications as prescribed Attend all scheduled provider appointments Call provider office for new concerns or questions  call the Suicide and Crisis Lifeline: 988 call the [place] National Suicide Prevention Lifeline: (417) 347-5095 or TTY: 670-413-5819 TTY 3040239707) to talk to a trained counselor call 1-800-273-TALK (toll free, 24 hour hotline) if experiencing a Mental Health or [place]  keep appointment with eye doctor check feet daily for cuts, sores or redness trim toenails straight across wash and dry feet carefully every day wear comfortable, cotton socks wear comfortable, well-fitting shoes  Chronic Condition 3: AFIB Provider Assessment and Plan  Expand All Collapse All    Subjective:  Patient ID: Andre Shaffer, male    DOB: 20-Dec-1940  Age: 81 y.o. MRN: [id number]      Chief Complaint  Patient presents with   Diabetes   Hypertension      HPI Diabetes:  Complications: neuropathy.  Glucose checking: Checks sugars fasting daily Glucose logs: 120-190s Hypoglycemia: some. Most recent A1C: 6.8 Current medications: Metformin 1000 mg take 1 tablet BID, Novolog insulin injection 70/30 60 U in am and 40 U before supper, Gabapentin 300 mg 1 capsule twice daily. Last Eye Exam: 08/09/2021 - The patient seen the Eye doctor at the [place] around 11/22, and they stated that the patient has a wrinkle in his right eye and it would be monitored closely. He was instructed to call them back if he had experienced any significant eyesight changes. Following up in 6 months.  Foot checks: daily.    Hyperlipidemia: Current medications: Zetia  10 mg 1 tablet daily. Atorvastatin 20 mg daily.    Hypertension/CAD: Current medications: Metoprolol 25 mg take 1 tablet daily, Telmisartan 80 mg take 1  tablet daily.   Diet: healthy.  Exercise: no   .. Hospital follow up: NSTEMI, New onset Atrial fibrillation, Hyperlipidemia, diabetes.   Troponins high. LHC stent. Went to [facility] on 08/11/2022 and was initially evaluated and then transferred to [facility] for LHC.  Admitted on 08/12/2022. Discharged 08/13/2022.  Prox RCA lesion is 100% stenosed.   Origin to Prox Graft lesion is 100% stenosed.   Ost LAD to Mid LAD lesion is 60% stenosed.   Mid LAD lesion is 100% stenosed.   Mid Cx to Dist Cx lesion is 100% stenosed.   Mid Graft lesion is 99% stenosed.   A drug-eluting stent was successfully placed using a SYNERGY XD 3.0X16.   Post intervention, there is a 0% residual stenosis.   SVG graft was not visualized.   LIMA graft was visualized by angiography.   SVG graft was visualized by angiography.   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 The mid and distal LAD fills from the patent LIMA graft. There is diffuse disease in the LAD beyond graft insertion.  Chronic occlusion distal Circumflex. Patent SVG to OM with severe disease in distal body of SVG to OM Chronic occlusion of the proximal RCA. Occluded vein graft to RCA. The RCA fills from left to right collaterals.  LVEDP=16-18 mmHg   Recommendations: Continue DAPT with ASA and Plavix for one month then stop ASA. Discharged on eliquis 5 mg twice daily for new onset atrial fibrillation. Continue beta blocker and statin.    No more chest pressure. No worsenening swelling in legs. DYSPNEA ON EXERTION with stairs. Scheduled to go to Cardiopulmonary rehab.          Expected Outcome/Goals Addressed This Visit (Provider CCM goals/Provider Assessment and plan   CCM (AFIB)  EXPECTED OUTCOME:  MONITOR, SELF- MANAGE AND REDUCE SYMPTOMS OF AFIB  Symptom Management Condition 3: Take all medications as  prescribed Attend all scheduled provider appointments Call provider office for new concerns or questions  call the Suicide and Crisis Lifeline: 988 call the [place] National Suicide Prevention Lifeline: 804-066-8694 or TTY: 845-226-7207 TTY (226) 179-9178) to talk to a trained counselor call 1-800-273-TALK (toll free, 24 hour hotline) if experiencing a Mental Health or [place]  make a plan to exercise regularly make a plan to eat healthy keep all lab appointments take medicine as prescribed  Problem List Patient Active Problem List   Diagnosis Date Noted   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   Atrial fibrillation ([place]) 08/19/2022   NSTEMI (non-ST elevated myocardial infarction) ([place]) 08/12/2022   Non-ST elevation (NSTEMI) myocardial infarction ([place])    Class 1 obesity due to excess calories with serious comorbidity and body mass index (BMI) of 34.0 to 34.9 in adult 05/23/2022   Imbalance 05/23/2022   B12 deficiency 05/23/2022   Type 2 diabetes mellitus with hyperglycemia, with long-term current use of insulin ([place]) 05/18/2022   Presbyopia 01/22/2022   Thrombocytopenia ([place]) 09/13/2021   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   Diabetic polyneuropathy associated with type 2 diabetes mellitus ([place]) 06/22/2020   Class 1 obesity due to excess calories with serious comorbidity and body mass index (BMI) of 33.0 to 33.9 in adult 01/25/2020  Mixed hyperlipidemia 02/17/2009   PERIPHERAL NEUROPATHY 02/17/2009   Hypertensive heart disease without congestive heart failure 02/17/2009   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 Check blood glucose twice daily. E11.42, Disp: 1 kit, Rfl: 0   clopidogrel (PLAVIX) 75 MG tablet, Take 1 tablet (75 mg total) by mouth daily with breakfast., Disp: 30 tablet, Rfl: 2   Cyanocobalamin 1000 MCG/ML LIQD, Take 2,500 mcg by mouth See admin instructions. Place 0.5 dropperful (2,500 mcg) sublingually daily, Disp: , Rfl:    gabapentin (NEURONTIN) 300 MG capsule, Take 1 capsule (300 mg total) by mouth 2 (two) times daily., Disp: 180 capsule, Rfl: 3   glucose blood test strip, Use as instructed, Check blood glucose twice daily., Disp: 100 each, Rfl: 12   glucose blood test strip, , Disp: , Rfl:    insulin aspart protamine- aspart (NOVOLOG MIX 70/30) (70-30) 100 UNIT/ML injection, Inject 0.4-0.6 mLs (40-60 Units total) into the skin See admin instructions. Inject 60 units into the skin before breakfast and 40 units units before supper/evening meal, Disp: 10 mL, Rfl: 0   Insulin Pen Needle (NOVOFINE) 30G X 8 MM MISC, Inject 10 each into the skin as needed. E11.42, Disp: 100 each, Rfl: 2   Lancets (ONETOUCH ULTRASOFT) lancets, Use as instructed, Check blood glucose twice daily., Disp: 100 each, Rfl: 12   metFORMIN (GLUCOPHAGE) 1000 MG tablet, TAKE 1 TABLET BY MOUTH 2 TIMES DAILY., Disp: 180 tablet, Rfl: 0   metoprolol succinate (TOPROL-XL) 25 MG 24 hr tablet, TAKE ONE (1) TABLET ONCE DAILY (Patient taking differently: Take 25 mg by mouth daily.), Disp: 90 tablet, Rfl: 3   telmisartan (MICARDIS) 80 MG tablet, TAKE ONE (1) TABLET BY MOUTH ONCE DAILY (Patient taking differently: Take 80 mg by mouth daily.), Disp: 90 tablet, Rfl: 3  Current Facility-Administered Medications:    Insulin Pen Needle (NOVOFINE) 10 each, 1 packet, Subcutaneous, PRN, Cox, Kirsten, MD  Cognitive Assessment Identity Confirmed: : Name; DOB Cognitive Status: Normal   Functional Assessment Hearing Difficulty  or Deaf: no Wear Glasses or Blind: yes Vision Management: wears glasses Concentrating, Remembering or Making Decisions Difficulty (CP): no Difficulty Communicating: no Difficulty Eating/Swallowing: no Walking or Climbing Stairs Difficulty: no Dressing/Bathing Difficulty: no Doing Errands Independently Difficulty (such as shopping) (CP): no Change in Functional Status Since Onset of Current Illness/Injury: no   Caregiver Assessment  Primary Source of Support/Comfort: spouse Name of Support/Comfort Primary Source: Climmie Buelow- wife People in Home: spouse Name(s) of People in Home: Inez Catalina- wife Family Caregiver if Needed: spouse Family Caregiver Names: Freight forwarder Roles/Responsibilities: retired Expected Impact of Illness/Hospitalization: doing well after having a heart attack, states: "I am cool as a cucumber"   Planned Interventions  Provider order and care plan reviewed. Collaborated with PharmD regarding patient care and plan. Counseled on increased risk of stroke due to Afib and benefits of anticoagulation for stroke prevention           Reviewed importance of adherence to anticoagulant exactly  as prescribed Advised patient to discuss changes in his AFIB, questions or concerns about AFIB or heart conditions with provider Counseled on bleeding risk associated with AFIB and importance of self-monitoring for signs/symptoms of bleeding Counseled on avoidance of NSAIDs due to increased bleeding risk with anticoagulants Counseled on importance of regular laboratory monitoring as prescribed Counseled on seeking medical attention after a head injury or if there is blood in the urine/stool Afib action plan reviewed Screening for signs and symptoms of depression related to chronic disease state Assessed social determinant of health barriers Provided education to patient about basic DM disease process; Reviewed medications with patient and discussed importance of medication adherence;         Reviewed prescribed diet with patient heart healthy/ADA diet ; Counseled on importance of regular laboratory monitoring as prescribed;        Discussed plans with patient for ongoing care management follow up and provided patient with direct contact information for care management team;      Provided patient with written educational materials related to hypo and hyperglycemia and importance of correct treatment;       Reviewed scheduled/upcoming provider appointments including: 12-02-2022 with the pcp;         Advised patient, providing education and rationale, to check cbg twice daily and when you have symptoms of low or high blood sugar and record        call provider for findings outside established parameters;       Referral made to pharmacy team for assistance with PA needs related to fargixa. The patient went by the office today and picked up samples of Jardiance 10 mg until he can get PA for Jeani Hawking D consult pending;       Review of patient status, including review of consultants reports, relevant laboratory and other test results, and medications completed;       Advised patient to discuss changes in DM health and well being with provider;      Screening for signs and symptoms of depression related to chronic disease state;        Assessed social determinant of health barriers;    Evaluation of current treatment plan related to hypertension self management and patient's adherence to plan as established by provider;   Provided education to patient re: stroke prevention, s/s of heart attack and stroke. Patient had a NSTEMI in October. Is doing well post hospitalization and heart catheterization. Patient is currently participating in cardiac rehab, first session today; Reviewed prescribed diet heart healthy/ADA diet  Reviewed medications with patient and discussed importance of compliance. Pharm D referral pending;  Discussed plans with patient for ongoing care management follow up  and provided patient with direct contact information for care management team; Advised patient, providing education and rationale, to monitor blood pressure daily and record, calling PCP for findings outside established parameters;  Reviewed scheduled/upcoming provider appointments including: 10-12-2022 with cardiologist and pcp on 12-02-2022 Advised patient to discuss changes in HTN or heart health with provider; Provided education on prescribed diet heart healthy/ADA diet ;  Discussed complications of poorly controlled blood pressure such as heart disease, stroke, circulatory complications, vision complications, kidney impairment, sexual dysfunction;  Screening for signs and symptoms of depression related to chronic disease state;  Assessed social determinant of health barriers      Interaction and coordination with outside resources, practitioners, and providers See CCM Referral  Care Plan: Patient declined

## 2022-09-13 ENCOUNTER — Telehealth: Payer: Self-pay

## 2022-09-13 NOTE — Progress Notes (Signed)
Chronic Care Management Pharmacy Assistant   Name: Andre Shaffer  MRN: [id number] DOB: 1941/06/23  Reason for Encounter: Chart review for CPP visit on 09-15-2022   Conditions to be addressed/monitored: HTN, HLD, and DMII  Recent office visits:  09-10-2022 Vanita Ingles, RN (CCM).  08-25-2022 Rochel Brome, MD. STOP flonase. Flu vaccine given.  05-18-2022 Rochel Brome, MD. Platelets= 127. Glucose= 126. A1C= 7.5. Cholesterol= 91, HDL= 31. STOP zetia.  Recent consult visits:  08-30-2022 [facility]). TRIMMED 1-10.   08-20-2022 Sharmon Revere (Cardiology). EKG completed.  08-12-2022 Burnell Blanks, MD. LEFT HEART CATH AND CORS/GRAFTS ANGIOGRAPHY procedure completed.   06-01-2022  MCCORKLE,KIM N ([facility]). TRIMMED 1-10.   05-24-2022 Mincey, Neena Rhymes (Ophthalmology). Unable to view encounter.  Hospital visits:  None in previous 6 months  Medications: Outpatient Encounter Medications as of 09/13/2022  Medication Sig   apixaban (ELIQUIS) 5 MG TABS tablet Take 1 tablet (5 mg total) by mouth 2 (two) times daily.   atorvastatin (LIPITOR) 20 MG tablet Take 1 tablet (20 mg total) by mouth daily.   Blood Glucose Monitoring Suppl (ONETOUCH VERIO) w/Device KIT Use as instructed. Check blood glucose twice daily. E11.42   clopidogrel (PLAVIX) 75 MG tablet Take 1 tablet (75 mg total) by mouth daily with breakfast.   Cyanocobalamin 1000 MCG/ML LIQD Take 2,500 mcg by mouth See admin instructions. Place 0.5 dropperful (2,500 mcg) sublingually daily   gabapentin (NEURONTIN) 300 MG capsule Take 1 capsule (300 mg total) by mouth 2 (two) times daily.   glucose blood test strip Use as instructed, Check blood glucose twice daily.   glucose blood test strip    insulin aspart protamine- aspart (NOVOLOG MIX 70/30) (70-30) 100 UNIT/ML injection Inject 0.4-0.6 mLs (40-60 Units total) into the skin See admin instructions. Inject 60 units into the skin before  breakfast and 40 units units before supper/evening meal   Insulin Pen Needle (NOVOFINE) 30G X 8 MM MISC Inject 10 each into the skin as needed. E11.42   Lancets (ONETOUCH ULTRASOFT) lancets Use as instructed, Check blood glucose twice daily.   metFORMIN (GLUCOPHAGE) 1000 MG tablet TAKE 1 TABLET BY MOUTH 2 TIMES DAILY.   metoprolol succinate (TOPROL-XL) 25 MG 24 hr tablet TAKE ONE (1) TABLET ONCE DAILY (Patient taking differently: Take 25 mg by mouth daily.)   telmisartan (MICARDIS) 80 MG tablet TAKE ONE (1) TABLET BY MOUTH ONCE DAILY (Patient taking differently: Take 80 mg by mouth daily.)   Facility-Administered Encounter Medications as of 09/13/2022  Medication   Insulin Pen Needle (NOVOFINE) 10 each   Lab Results  Component Value Date/Time   HGBA1C 6.8 (H) 08/13/2022 03:25 AM   HGBA1C 7.5 (H) 05/18/2022 10:45 AM   MICROALBUR 80 09/10/2021 11:44 AM   MICROALBUR 150 04/16/2021 02:11 PM     BP Readings from Last 3 Encounters:  08/25/22 104/68  08/20/22 134/78  08/13/22 (!) 143/80      Have you seen any other providers since your last visit with PCP? No  What is your top health concern to discuss at your upcoming visit? Patient's wife stated the need of patient assistance with farxiga  Do you have any problems getting your medications from the pharmacy? Yes  Financial barriers? Yes Access barriers? No Will start application for farxiga to be mailed.  Andre Shaffer was reminded to have all medications, supplements and any blood glucose and/or blood pressure readings available for review with Andre Shaffer, Pharm. D, at  his telephone visit on 10-15-2022 at 12:30.    Care Gaps: Last Annual Wellness visit? Patient's stated 12-2021 at the [facility] Last Colonoscopy? 01-16-2021 Last Mammogram? None Last Bone Denisty? None Shingrix overdue Tdapmoverdue Covid booster overdue  Last eye exam / retinopathy screening? 05-24-2022 Last diabetic foot exam? 08-30-2022   Star Rating  Drugs: Telmisartan 80 mg- Last filled 08-02-2022 90 DS. Previous filled 04-30-2022 90 DS. Atorvastatin 20 mg- Last filled 08-02-2022 90 DS. Previous filled 04-30-2022 90 DS. Metformin 1000 mg- Last filled 06-15-2022 90 DS. Previous 03-11-2022 90 DS  [place] Clinical Pharmacist Assistant [phone]

## 2022-09-15 ENCOUNTER — Ambulatory Visit: Payer: PPO

## 2022-09-15 DIAGNOSIS — I4891 Unspecified atrial fibrillation: Secondary | ICD-10-CM

## 2022-09-15 DIAGNOSIS — E782 Mixed hyperlipidemia: Secondary | ICD-10-CM

## 2022-09-15 DIAGNOSIS — Z794 Long term (current) use of insulin: Secondary | ICD-10-CM

## 2022-09-15 NOTE — Patient Instructions (Signed)
Visit Information   Goals Addressed   None    Patient Care Plan: CCM Pharmacy Care Plan     Problem Identified: Disease State Management   Priority: High  Onset Date: 09/15/2022     Long-Range Goal: HTN, DM, Cholesterol   Start Date: 09/15/2022  Expected End Date: 09/15/2023  This Visit's Progress: On track  Priority: High  Note:   Current Barriers:  Does not contact provider office for questions/concerns  Pharmacist Clinical Goal(s):  Patient will contact provider office for questions/concerns as evidenced notation of same in electronic health record through collaboration with PharmD and provider.   Interventions: 1:1 collaboration with Cox, Elnita Maxwell, MD regarding development and update of comprehensive plan of care as evidenced by provider attestation and co-signature Inter-disciplinary care team collaboration (see longitudinal plan of care) Comprehensive medication review performed; medication list updated in electronic medical record  CAD: (LDL goal < 70) -S/p Remote Inf MI and CABG x 4 in 1997 -NSTEMI 08/2022 s/p 3 x 16 mm Synergy DES to S-OM The ASCVD Risk score (Arnett DK, et al., 2019) failed to calculate for the following reasons:   The 2019 ASCVD risk score is only valid for ages 79 to 47   The patient has a prior MI or stroke diagnosis Lab Results  Component Value Date   CHOL 91 (L) 05/18/2022   CHOL 159 01/12/2022   CHOL 157 09/11/2021   Lab Results  Component Value Date   HDL 31 (L) 05/18/2022   HDL 35 (L) 01/12/2022   HDL 32 (L) 09/11/2021   Lab Results  Component Value Date   LDLCALC 46 05/18/2022   LDLCALC 106 (H) 01/12/2022   LDLCALC 103 (H) 09/11/2021   Lab Results  Component Value Date   TRIG 63 05/18/2022   TRIG 98 01/12/2022   TRIG 120 09/11/2021   Lab Results  Component Value Date   CHOLHDL 2.9 05/18/2022   CHOLHDL 4.5 01/12/2022   CHOLHDL 4.9 09/11/2021  No results found for: "LDLDIRECT" Last vitamin D No results found for:  "25OHVITD2", "25OHVITD3", "VD25OH" Lab Results  Component Value Date   TSH 4.100 05/18/2022  -Controlled -Current treatment: Atorvastatin 72m Appropriate, Effective, Safe, Accessible Clopidogrel 759mAppropriate, Effective, Safe, Accessible -Medications previously tried: Atorvastatin high intensity, ASA (Stopped DAPT on 09/12/22) -Current dietary patterns: "Tries to eat healthy" -Current exercise habits: Cardiac Rehab weekly -Educated on Cholesterol goals;  -Recommended to continue current medication  Diabetes (A1c goal <7%) Lab Results  Component Value Date   HGBA1C 6.8 (H) 08/13/2022   HGBA1C 7.5 (H) 05/18/2022   HGBA1C 7.3 (H) 01/12/2022   Lab Results  Component Value Date   MICROALBUR 80 09/10/2021   LDLCALC 46 05/18/2022   CREATININE 1.20 08/13/2022   Lab Results  Component Value Date   NA 136 08/13/2022   K 4.1 08/13/2022   CREATININE 1.20 08/13/2022   EGFR 64 05/18/2022   GFRNONAA >60 08/13/2022   GLUCOSE 240 (H) 08/13/2022   Lab Results  Component Value Date   WBC 8.7 08/13/2022   HGB 12.1 (L) 08/13/2022   HCT 36.0 (L) 08/13/2022   MCV 95.7 08/13/2022   PLT 114 (L) 08/13/2022   Lab Results  Component Value Date   LABMICR 55.8 01/12/2022   MICROALBUR 80 09/10/2021   MICROALBUR 150 04/16/2021  -Controlled -Current medications: Farxiga 43m44mppropriate, Effective, Safe, Query accessible Inslin Aspart 40-60 units Appropriate, Effective, Safe, Accessible [facility] [place] Metformin (Was on IR max dose for years but  bothered stomach. Took with food)  -Current home glucose readings fasting glucose:  November 2023:  Went in 50's at [place] on 09/12/22 09/10/22: 95 09/11/22: 86  09/12/22: 102 09/13/22: 77 09/14/22: Forgot 09/15/22:76 -Reports hypoglycemic/hyperglycemic symptoms -Current exercise: Cardiac rehab twice a week -Educated on A1c and blood sugar goals; -Counseled to check feet daily and get yearly eye exams November  2023: Farxiga PAP. Counseled to call company to check on status 2 weeks after she brings in. Patient and wife make about $50,000/year  Atrial Fibrillation (Goal: prevent stroke and major bleeding) -S/p Remote Inf MI and CABG x 4 in 1997 -NSTEMI 08/2022 s/p 3 x 16 mm Synergy DES to S-OM -Echocardiogram in 2015: EF 45-50, inf HK -Echo 10/23: EF 55-60 -Controlled -CHADSVASC: 5 -Current treatment: Rate control:  Metoprolol Succ 721m QD Appropriate, Effective, Safe, Accessible Telmisartan 890mAppropriate, Effective, Safe, Accessible Anticoagulation:  Apixaban 21m76mppropriate, Effective, Safe, Query accessible 83 64o. Lab Results  Component Value Date   CREATININE 1.20 08/13/2022   Wt Readings from Last 3 Encounters:  08/25/22 255 lb (115.7 kg)  08/20/22 258 lb (117 kg)  05/18/22 265 lb (120.2 kg)   -Medications previously tried: N/A -Home BP and HR readings: Doesn't test  -Counseled on increased risk of stroke due to Afib and benefits of anticoagulation for stroke prevention; November 2023: They are worried about cost of Apixaban. Explained DH to them and [facility] [place] Go[facility]/Self[facility]Care Activities Patient will:  - take medications as prescribed as evidenced by patient report and record review  Follow Up Plan: The patient has been provided with contact information for the care management team and has been advised to call with any health related questions or concerns.    CPP F/U May 2024  [place] [facility] Rein - [phone]- [phone]   Mr. Pau was given information about Chronic Care Management services today including:  CCM service includes personalized support from designated clinical staff supervised by his physician, including individualized plan of care and coordination with other care providers 24/7 contact phone numbers for assistance for urgent and routine care needs. Standard insurance, coinsurance, copays and deductibles apply for chronic  care management only during months in which we provide at least 20 minutes of these services. Most insurances cover these services at 100%, however patients may be responsible for any copay, coinsurance and/or deductible if applicable. This service may help you avoid the need for more expensive face-to-face services. Only one practitioner may furnish and bill the service in a calendar month. The patient may stop CCM services at any time (effective at the end of the month) by phone call to the office staff.  Patient agreed to services and verbal consent obtained.   The patient verbalized understanding of instructions, educational materials, and care plan provided today and DECLINED offer to receive copy of patient instructions, educational materials, and care plan.  The pharmacy team will reach out to the patient again over the next 60 days.   NatLane HackerPHWales

## 2022-09-15 NOTE — Progress Notes (Signed)
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 Loves flowers.  Recommendations/Changes made from today's visit: -Farxiga PAP -Patient will try getting Apixaban from [place] due to cost  Subjective: Andre Shaffer is an 81 y.o. year old male who is a primary patient of Cox, Kirsten, MD.  The CCM team was consulted for assistance with disease management and care coordination needs.    Engaged with patient by telephone for initial visit in response to provider referral for pharmacy case management and/or care coordination services.   Consent to Services:  The patient was given the following information about Chronic Care Management services today, agreed to services, and gave verbal consent: 1. CCM service includes personalized support from designated clinical staff supervised by the primary care provider, including individualized plan of care and coordination with other care providers 2. 24/7 contact phone numbers for assistance for urgent and routine care needs. 3. Service will only be billed when office clinical staff spend 20 minutes or more in a month to coordinate care. 4. Only one practitioner may furnish and bill the service in a calendar month. 5.The patient may stop CCM services at any time (effective at the end of the month) by phone call to the office staff. 6. The patient will be responsible for cost sharing (co-pay) of up to 20% of the service fee (after annual deductible is met). Patient agreed to services and consent obtained.  Patient Care Team: Rochel Brome, MD as PCP - General (Family Medicine) Josue Hector, MD as PCP -  Cardiology (Cardiology) Vanita Ingles, RN as Case Manager ([place]) Lane Hacker, [facility] (Pharmacist)  ?G??nt office visits:  09-10-2022 Vanita Ingles, RN (CCM).   08-25-2022 Rochel Brome, MD. STOP flonase. Flu vaccine given.   05-18-2022 Rochel Brome, MD. Platelets= 127. Glucose= 126. A1C= 7.5. Cholesterol= 91, HDL= 31. STOP zetia.   Recent consult visits:  08-30-2022 [facility]). TRIMMED 1-10.    08-20-2022 Sharmon Revere (Cardiology). EKG completed.   08-12-2022 Burnell Blanks, MD. LEFT HEART CATH AND CORS/GRAFTS ANGIOGRAPHY procedure completed.    06-01-2022  MCCORKLE,[facility] N ([facility]). TRIMMED 1-10.    05-24-2022 Mincey, Neena Rhymes (Ophthalmology). Unable to view encounter.   Hospital visits:  None in previous 6 months    Objective:  Lab Results  Component Value Date   CREATININE 1.20 08/13/2022   BUN 17 08/13/2022   EGFR 64 05/18/2022   GFRNONAA >60 08/13/2022   GFRAA 75 10/06/2020   NA 136 08/13/2022   K 4.1 08/13/2022   CALCIUM 9.1 08/13/2022   CO2 22 08/13/2022   GLUCOSE 240 (H) 08/13/2022    Lab Results  Component Value Date/Time   HGBA1C 6.8 (H) 08/13/2022 03:25 AM   HGBA1C 7.5 (H) 05/18/2022 10:45 AM   MICROALBUR 80 09/10/2021 11:44 AM   MICROALBUR 150 04/16/2021 02:11 PM    Last diabetic Eye exam: No results found for: "HMDIABEYEEXA"  Last diabetic Foot exam: No results found for: "HMDIABFOOTEX"   Lab Results  Component Value Date   CHOL 91 (L) 05/18/2022   HDL 31 (L) 05/18/2022  LDLCALC 46 05/18/2022   TRIG 63 05/18/2022   CHOLHDL 2.9 05/18/2022       Latest Ref Rng & Units 05/18/2022   10:45 AM 01/12/2022   10:55 AM 09/11/2021   10:13 AM  Hepatic Function  Total Protein 6.0 - 8.5 g/dL 6.4  6.8  6.0   Albumin 3.7 - 4.7 g/dL 3.9  4.2  3.8   AST 0 - 40 IU/L _0 ALT 0 - 44 IU/L _1 Alk Phosphatase 44 - 121 IU/L 82  104  96   Total Bilirubin 0.0 - 1.2 mg/dL 0.7  0.5   0.4     Lab Results  Component Value Date/Time   TSH 4.100 05/18/2022 10:45 AM   TSH 3.590 10/06/2020 01:46 PM       Latest Ref Rng & Units 08/13/2022    3:25 AM 05/18/2022   10:45 AM 01/12/2022   10:55 AM  CBC  WBC 4.0 - 10.5 K/uL 8.7  7.6  7.8   Hemoglobin 13.0 - 17.0 g/dL 12.1  13.9  15.5   Hematocrit 39.0 - 52.0 % 36.0  41.5  45.8   Platelets 150 - 400 K/uL 114  127  149     Lab Results  Component Value Date/Time   VITAMINB12 300 05/18/2022 10:45 AM   VITAMINB12 920 10/06/2020 01:46 PM    Clinical ASCVD: Yes  The ASCVD Risk score (Arnett DK, et al., 2019) failed to calculate for the following reasons:   The 2019 ASCVD risk score is only valid for ages 42 to 19   The patient has a prior MI or stroke diagnosis       09/10/2022   11:36 AM 05/18/2022    9:30 AM 09/10/2021   10:37 AM  Depression screen PHQ 2/9  Decreased Interest 0 0 0  Down, Depressed, Hopeless 0 0 0  PHQ - 2 Score 0 0 0     Other: (CHADS2VASc if Afib, MMRC or CAT for COPD, ACT, DEXA)  Social History   Tobacco Use  Smoking Status Former   Packs/day: 1.00   Years: 20.00   Total pack years: 20.00   Types: Cigarettes   Quit date: 10/19/1975   Years since quitting: 46.9  Smokeless Tobacco Never   BP Readings from Last 3 Encounters:  08/25/22 104/68  08/20/22 134/78  08/13/22 (!) 143/80   Pulse Readings from Last 3 Encounters:  08/25/22 72  08/20/22 73  08/13/22 71   Wt Readings from Last 3 Encounters:  08/25/22 255 lb (115.7 kg)  08/20/22 258 lb (117 kg)  05/18/22 265 lb (120.2 kg)   BMI Readings from Last 3 Encounters:  08/25/22 33.64 kg/m  08/20/22 33.58 kg/m  05/18/22 34.96 kg/m    Assessment/Interventions: Review of patient past medical history, allergies, medications, health status, including review of consultants reports, laboratory and other test data, was performed as part of comprehensive evaluation and provision of chronic care management services.   SDOH:  (Social  Determinants of Health) assessments and interventions performed: Yes SDOH Interventions    Flowsheet Row Chronic Care Management from 09/15/2022 in [place] Management from 09/10/2022 in [place] Telephone from 08/16/2022 in [place] Coordination Office Visit from 10/06/2020 in [place]  SDOH Interventions      Food Insecurity Interventions -- Intervention Not Indicated Intervention Not Indicated --  Housing Interventions -- Intervention Not Indicated Intervention  Not Indicated --  Transportation Interventions Intervention Not Indicated Intervention Not Indicated Intervention Not Indicated --  Utilities Interventions -- Intervention Not Indicated -- --  Alcohol Usage Interventions -- Intervention Not Indicated (Score <7) -- --  Depression Interventions/Treatment  -- -- -- LHT3-4 Score <4 Follow-up Not Indicated  Financial Strain Interventions Other (Comment)  [place].Liming PAP] Intervention Not Indicated Intervention Not Indicated --  Physical Activity Interventions -- Intervention Not Indicated, Cardiac Rehab -- --  Stress Interventions -- Intervention Not Indicated -- --  Social Connections Interventions -- Intervention Not Indicated -- --      SDOH Screenings   Food Insecurity: No Food Insecurity (09/10/2022)  Housing: Low Risk  (09/10/2022)  Transportation Needs: No Transportation Needs (09/15/2022)  Utilities: Not At Risk (09/10/2022)  Alcohol Screen: Low Risk  (09/10/2022)  Depression (PHQ2-9): Low Risk  (09/10/2022)  Financial Resource Strain: High Risk (09/15/2022)  Physical Activity: Sufficiently Active (09/10/2022)  Social Connections: Socially Integrated (09/10/2022)  Stress: No Stress Concern Present (09/10/2022)  Tobacco Use: Medium Risk (08/31/2022)    [place]  Allergies  Allergen Reactions   Nitroglycerin Other (See Comments)    Blood pressure 'bottomed out'   Statins Other (See  Comments)    Elevation of liver enzymes   Tetanus Toxoid Other (See Comments)    Severe flu symptoms fever and chills   Penicillins Rash    Medications Reviewed Today     Reviewed by Vanita Ingles, RN (Case Manager) on 09/10/22 at 1137  Med List Status: <None>   Medication Order Taking? Sig Documenting Provider Last Dose Status Informant  apixaban (ELIQUIS) 5 MG TABS tablet [id number] No Take 1 tablet (5 mg total) by mouth 2 (two) times daily. Lily Kocher, PA-C Taking Active   aspirin 81 MG chewable tablet [id number] No Chew 1 tablet (81 mg total) by mouth daily. Lily Kocher, PA-C Taking Active   atorvastatin (LIPITOR) 20 MG tablet [id number] No Take 1 tablet (20 mg total) by mouth daily. Cox, Kirsten, MD Taking Active Spouse/Significant Other  Blood Glucose Monitoring Suppl [facility] VERIO) w/Device KIT [id number] No Use as instructed. Check blood glucose twice daily. E11.42 CoxElnita Maxwell, MD Taking Active Spouse/Significant Other  clopidogrel (PLAVIX) 75 MG tablet [id number] No Take 1 tablet (75 mg total) by mouth daily with breakfast. Lily Kocher, PA-C Taking Active   Cyanocobalamin 1000 MCG/ML LIQD [id number] No Take 2,500 mcg by mouth See admin instructions. Place 0.5 dropperful (2,500 mcg) sublingually daily [provider] Taking Active Spouse/Significant Other  gabapentin (NEURONTIN) 300 MG capsule [id number] No Take 1 capsule (300 mg total) by mouth 2 (two) times daily. Cox, Kirsten, MD Taking Active Spouse/Significant Other  glucose blood test strip [id number] No Use as instructed, Check blood glucose twice daily. Cox, Kirsten, MD Taking Active Spouse/Significant Other  glucose blood test strip [id number] No  [provider] Taking Active Spouse/Significant Other  insulin aspart protamine- aspart (NOVOLOG MIX 70/30) (70-30) 100 UNIT/ML injection [id number] No Inject 0.4-0.6 mLs (40-60 Units total) into the skin See admin instructions. Inject 60 units into the skin  before breakfast and 40 units units before supper/evening meal Cox, Kirsten, MD Taking Active Spouse/Significant Other  Insulin Pen Needle (NOVOFINE) 10 each [id number]   Rochel Brome, MD  Active   Insulin Pen Needle (NOVOFINE) 30G X 8 MM MISC [id number] No Inject 10 each into the skin as needed. E11.42 Rochel Brome, MD Taking Active Spouse/Significant Other  Lancets [facility] ULTRASOFT) lancets [id number] No Use as instructed, Check blood glucose twice  daily. Cox, Kirsten, MD Taking Active Spouse/Significant Other  metFORMIN (GLUCOPHAGE) 1000 MG tablet [id number] No TAKE 1 TABLET BY MOUTH 2 TIMES DAILY. Cox, Kirsten, MD Taking Active Spouse/Significant Other  metoprolol succinate (TOPROL-XL) 25 MG 24 hr tablet [id number] No TAKE ONE (1) TABLET ONCE DAILY  Patient taking differently: Take 25 mg by mouth daily.   Cox, Kirsten, MD Taking Active Spouse/Significant Other  telmisartan (MICARDIS) 80 MG tablet [id number] No TAKE ONE (1) TABLET BY MOUTH ONCE DAILY  Patient taking differently: Take 80 mg by mouth daily.   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   Atrial fibrillation ([place]) 08/19/2022   NSTEMI (non-ST elevated myocardial infarction) ([place]) 08/12/2022   Non-ST elevation (NSTEMI) myocardial infarction ([place])    Class 1 obesity due to excess calories with serious comorbidity and body mass index (BMI) of 34.0 to 34.9 in adult 05/23/2022   Imbalance 05/23/2022   B12 deficiency 05/23/2022   Type 2 diabetes mellitus with hyperglycemia, with long-term current use of insulin ([place]) 05/18/2022   Presbyopia 01/22/2022   Thrombocytopenia ([place]) 09/13/2021   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   Diabetic polyneuropathy associated with type 2 diabetes mellitus ([place]) 06/22/2020   Class 1 obesity due to excess calories with serious comorbidity and body mass index (BMI) of 33.0 to 33.9 in adult  01/25/2020   Mixed hyperlipidemia 02/17/2009   PERIPHERAL NEUROPATHY 02/17/2009   Hypertensive heart disease without congestive heart failure 02/17/2009   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    Immunization History  Administered Date(s) Administered   Fluad Quad(high Dose 65+) 10/06/2020, 08/10/2021, 08/25/2022   Influenza, High Dose Seasonal PF 08/02/2015, 07/06/2016, 08/01/2017, 08/01/2018   Influenza-Unspecified 08/02/2019, 10/06/2020   PFIZER(Purple Top)SARS-COV-2 Vaccination 01/17/2020, 02/07/2020, 10/16/2020   Pneumococcal Polysaccharide-23 08/01/2014    Conditions to be addressed/monitored:  Hypertension, Hyperlipidemia, Diabetes, and Atrial Fibrillation  Care Plan : [place]  Updates made by Lane Hacker, Shelby since 09/15/2022 12:00 AM     Problem: Disease State Management   Priority: High  Onset Date: 09/15/2022     Long-Range Goal: HTN, DM, Cholesterol   Start Date: 09/15/2022  Expected End Date: 09/15/2023  This Visit's Progress: On track  Priority: High  Note:   Current Barriers:  Does not contact provider office for questions/concerns  Pharmacist Clinical Goal(s):  Patient will contact provider office for questions/concerns as evidenced notation of same in electronic health record through collaboration with PharmD and provider.   Interventions: 1:1 collaboration with Cox, Elnita Maxwell, MD regarding development and update of comprehensive plan of care as evidenced by provider attestation and co-signature Inter-disciplinary care team collaboration (see longitudinal plan of care) Comprehensive medication review performed; medication list updated in electronic medical record  CAD: (LDL goal < 70) -S/p Remote Inf MI and CABG x 4 in 1997 -NSTEMI 08/2022 s/p 3 x 16 mm Synergy DES to S-OM The ASCVD Risk score (Arnett DK, et al., 2019) failed to calculate for the following reasons:   The 2019 ASCVD risk score is only valid for ages 23  to 76   The patient has a prior MI or stroke diagnosis Lab Results  Component Value Date   CHOL 91 (L) 05/18/2022   CHOL 159 01/12/2022   CHOL 157 09/11/2021   Lab Results  Component Value Date   HDL 31 (L) 05/18/2022   HDL  35 (L) 01/12/2022   HDL 32 (L) 09/11/2021   Lab Results  Component Value Date   LDLCALC 46 05/18/2022   LDLCALC 106 (H) 01/12/2022   LDLCALC 103 (H) 09/11/2021   Lab Results  Component Value Date   TRIG 63 05/18/2022   TRIG 98 01/12/2022   TRIG 120 09/11/2021   Lab Results  Component Value Date   CHOLHDL 2.9 05/18/2022   CHOLHDL 4.5 01/12/2022   CHOLHDL 4.9 09/11/2021  No results found for: "LDLDIRECT" Last vitamin D No results found for: "25OHVITD2", "25OHVITD3", "VD25OH" Lab Results  Component Value Date   TSH 4.100 05/18/2022  -Controlled -Current treatment: Atorvastatin 67m Appropriate, Effective, Safe, Accessible Clopidogrel 772mAppropriate, Effective, Safe, Accessible -Medications previously tried: Atorvastatin high intensity, ASA (Stopped DAPT on 09/12/22) -Current dietary patterns: "Tries to eat healthy" -Current exercise habits: Cardiac Rehab weekly -Educated on Cholesterol goals;  -Recommended to continue current medication  Diabetes (A1c goal <7%) Lab Results  Component Value Date   HGBA1C 6.8 (H) 08/13/2022   HGBA1C 7.5 (H) 05/18/2022   HGBA1C 7.3 (H) 01/12/2022   Lab Results  Component Value Date   MICROALBUR 80 09/10/2021   LDLCALC 46 05/18/2022   CREATININE 1.20 08/13/2022   Lab Results  Component Value Date   NA 136 08/13/2022   K 4.1 08/13/2022   CREATININE 1.20 08/13/2022   EGFR 64 05/18/2022   GFRNONAA >60 08/13/2022   GLUCOSE 240 (H) 08/13/2022   Lab Results  Component Value Date   WBC 8.7 08/13/2022   HGB 12.1 (L) 08/13/2022   HCT 36.0 (L) 08/13/2022   MCV 95.7 08/13/2022   PLT 114 (L) 08/13/2022   Lab Results  Component Value Date   LABMICR 55.8 01/12/2022   MICROALBUR 80 09/10/2021    MICROALBUR 150 04/16/2021  -Controlled -Current medications: Farxiga 2m37mppropriate, Effective, Safe, Query accessible Inslin Aspart 40-60 units Appropriate, Effective, Safe, Accessible [facility] [place]: Metformin (Was on IR max dose for years but bothered stomach. Took with food)  -Current home glucose readings fasting glucose:  November 2023:  W[facility] in [facility]'s at [facility] 09/12/22 09/10/22: 95 09/11/22: 86  09/12/22: 102 09/13/22: 77 09/14/22: Forgot 09/15/22:76 -Reports hypoglycemic/hyperglycemic symptoms -Current exercise: Cardiac rehab twice a week -Educated on A1c and blood sugar goals; -Counseled to check feet daily and get yearly eye exams November 2023: Farxiga PAP. Counseled to call company to check on status 2 weeks after she brings in. Patient and wife make about $50,000/year  Atrial Fibrillation (Goal: prevent stroke and major bleeding) -S/p Remote Inf MI and CABG x 4 in 1997 -NSTEMI 08/2022 s/p 3 x 16 mm Synergy DES to S-OM -Echocardiogram in 2015: EF 45-50, inf HK -Echo 10/23: EF 55-60 -Controlled -CHADSVASC: 5 -Current treatment: Rate control:  Metoprolol Succ 22m77m Appropriate, Effective, Safe, Accessible Telmisartan 80mg56mropriate, Effective, Safe, Accessible Anticoagulation:  Apixaban 2mg A41mopriate, Effective, Safe, Query accessible 81 [email or web address] Results  Component Value Date   CREATININE 1.20 08/13/2022   Wt Readings from Last 3 Encounters:  08/25/22 255 lb (115.7 kg)  08/20/22 258 lb (117 kg)  05/18/22 265 lb (120.2 kg)   -Medications previously tried: N/A -Home BP and HR readings: Doesn't test  -Counseled on increased risk of stroke due to Afib and benefits of anticoagulation for stroke prevention; November 2023: They are worried about cost of Apixaban. Explained DH to them and [facility]  [place]Self[place]Care Activities Patient will:  - take medications  as prescribed as evidenced by  patient report and record review  Follow Up Plan: The patient has been provided with contact information for the care management team and has been advised to call with any health related questions or concerns.    CPP F/U May 2024  [place] Constable, Sherian Rein.D. - [phone]       Medication Assistance:  Farxiga: -PAP started for 2023/2024  Eliquis: -Patient will try to get through [place]. If unable to, will try other options  Compliance/Adherence/Medication fill history: Care Gaps: Last Annual Wellness visit? Patient's stated 12-2021 at [facility] Last Colonoscopy? 01-16-2021 Last Mammogram? None Last Bone Denisty? None Shingrix overdue Tdapmoverdue Covid booster overdue   Last eye exam / retinopathy screening? 05-24-2022 Last diabetic foot exam? 08-30-2022     Star Rating Drugs: Telmisartan 80 mg- Last filled 08-02-2022 90 DS. Previous filled 04-30-2022 90 DS. Atorvastatin 20 mg- Last filled 08-02-2022 90 DS. Previous filled 04-30-2022 90 DS. Metformin 1000 mg- Last filled 06-15-2022 90 DS. Previous 03-11-2022 90 DS  Patient's preferred pharmacy is:  [facility] [address] [place] [address] Phone: [phone] Fax: [phone]  [facility] [address] [place] [address] Phone: [phone] Fax: [phone]  Uses pill box? No -   Pt endorses 100% compliance  We discussed: Benefits of medication synchronization, packaging and delivery as well as enhanced pharmacist oversight with [employer]. Patient decided to: Continue current medication management strategy  Care Plan and Follow Up Patient Decision:  Patient agrees to Care Plan and Follow-up.  Plan: The patient has been provided with contact information for the care management team and has been advised to call with any health related questions or concerns.   CPP F/U May 2023  [place] Constable, Sherian Rein.D. -  [phone]

## 2022-09-28 NOTE — H&P (View-Only) (Signed)
Cardiology Office Note:    Date:  10/12/2022   ID:  Andre Shaffer, DOB 01/18/1941, MRN [id number]  PCP:  Rochel Brome, MD  [place] [facility] Cardiologist: New last seen 2015  Referring MD: Rochel Brome, MD   Chief Complaint:  No chief complaint on file.    Patient Profile: Coronary artery disease  S/p Remote Inf MI and CABG x 4 in 1997 NSTEMI 08/2022 s/p 3 x 16 mm Synergy DES to S-OM L-LAD, S-Dx patent; S-RCA occluded  Ischemic CM Echocardiogram in 2015: EF 45-50, inf HK Echo 10/23: EF 55-60 Persistent atrial fibrillation Echo 10/23: Moderate biatrial enlargement Diabetes mellitus  Hypertension  Hyperlipidemia  Hx of transaminitis with high intensity statin Rx.   Prior CV Studies: LEFT HEART CATH 08/12/2022 LAD ostial 80, mid 100 CTO LCx mid 100 CTO RCA proximal 100 CTO; RPL 3 filled by collaterals from OM 2 SVG-RCA 100 CTO LIMA-LAD patent SVG-OM3 99 SVG-D2 patent PCI: 3 x 16 mm Synergy DES to the SVG-OM3  Severe three vessel CAD s/p 3V CABG with 3/3 patent bypass grafts with severe disease in the SVG to OM Successful PTCA/DES x 1 body of SVG to OM Chronic occlusion mid LAD. The mid and distal LAD fills from the patent LIMA graft. There is diffuse disease in the LAD beyond graft insertion. Chronic occlusion distal Circumflex. Patent SVG to OM with severe disease in distal body of SVG to OM Chronic occlusion of the proximal RCA. Occluded vein graft to RCA. The RCA fills from left to right collaterals. LVEDP=16-18 mmHg  Recommendations: Continue DAPT with ASA and Plavix for one month then stop ASA as he will likely be discharged on a DOAC with new atrial fib. Continue beta blocker and statin.    Echocardiogram 08/13/2022 EF 55-60, no RWMA, normal PASP, moderate BAE, trivial MR, RAP 3  History of Present Illness:   Andre Shaffer is a 81 y.o. male with the above problem list.  He was admitted 10/12 to 10/13 with a NSTEMI. He initially presented to  [facility] and was transferred to [facility]. Cath revealed chronically occluded S-RCA, patent L-LAD and S-Dx and severe disease in the body of the S-OM graft which was treated with a DES. Pt was noted to be in AFib with controlled rate. He was started on Eliquis. ASA d/c after one month   Has been doing cardiac rehab at [facility].    Does not notice palpitations Still in afib. No chest pain Needs Moeh's surgery to left eye area Discussed having to wait at least 8 weeks given recent stent and plan [facility] needs 4 weeks post to recover mechanical function  In recommending this procedure your doctor has  balanced the benefits and risks of the procedure  against the benefits and risks of not proceeding. Your  doctor believes there is a net benefit to you going  ahead. This is a very complicated assessment.  There are risks and complications with this procedure.  They include but are not limited to the following.  Common risks and complications (more than 5%) include:  ? Skin irritation/redness from adhesive pads. ? Recurrence of Atrial Fibrillation (AF) within 12-24  months. ? The procedure may not be successful. Abnormal  heart rhythm may persist. Rare risks and complications (less than 1%)  include:  ? May require a pacemaker. This is usually due to an  underlying heart condition. ? Blood clot in the lung.  ? Heart Attack. ? A stroke. This  can cause long term disability. ? Death as a result of this procedure is rare.    Willing to proceed next week     Past Medical History:  Diagnosis Date   Abnormal liver function    Arthritis    Atrial fibrillation ([place]) 08/12/2022   Eliquis   Coronary Artery Disease    s/p MI in 1997 followed by CABG // NSTEMI 08/2022 >> s/p DES to S-OM [L-LAD patent, S-Dx patent, S-RCA CTO] // Echo 2015: EF 45-50, inf HK // Echo 10/23: EF 55-60, no WMA   Diabetes mellitus without complication (HCC)    GERD (gastroesophageal reflux disease)     Hyperlipemia    Hypertension    MI (myocardial infarction) ([place])    SCC (squamous cell carcinoma), arm, left    SCC (squamous cell carcinoma), arm, 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   atorvastatin (LIPITOR) 20 MG tablet Take 1 tablet (20 mg total) by mouth daily.   Blood Glucose Monitoring Suppl (ONETOUCH VERIO) w/Device KIT Use as instructed. Check blood glucose twice daily. E11.42   clopidogrel (PLAVIX) 75 MG tablet Take 1 tablet (75 mg total) by mouth daily with breakfast.   Cyanocobalamin 1000 MCG/ML LIQD Take 2,500 mcg by mouth See admin instructions. Place 0.5 dropperful (2,500 mcg) sublingually daily   dapagliflozin propanediol (FARXIGA) 5 MG TABS tablet Take 5 mg by mouth daily.   gabapentin (NEURONTIN) 300 MG capsule Take 1 capsule (300 mg total) by mouth 2 (two) times daily.   glucose blood test strip Use as instructed, Check blood glucose twice daily.   glucose blood test strip    insulin aspart protamine- aspart (NOVOLOG MIX 70/30) (70-30) 100 UNIT/ML injection Inject 0.4-0.6 mLs (40-60 Units total) into the skin See admin instructions. Inject 60 units into the skin before breakfast and 40 units units before supper/evening meal   Insulin Pen Needle (NOVOFINE) 30G X 8 MM MISC Inject 10 each into the skin as needed. E11.42   Lancets (ONETOUCH ULTRASOFT) lancets Use as instructed, Check blood glucose twice daily.   metoprolol succinate (TOPROL-XL) 25 MG 24 hr tablet TAKE ONE (1) TABLET ONCE DAILY   Multiple Vitamins-Minerals (PRESERVISION AREDS PO) Take 1 capsule by mouth 2 (two) times daily.   telmisartan (MICARDIS) 80 MG tablet TAKE ONE (1) TABLET BY MOUTH ONCE DAILY   Current Facility-Administered Medications for the 10/12/22 encounter (Office Visit) with Josue Hector, MD  Medication   Insulin Pen Needle (NOVOFINE) 10 each    Allergies:    Nitroglycerin, Statins, Tetanus toxoid, and Penicillins   Social History   Tobacco Use   Smoking status: Former    Packs/day: 1.00    Years: 20.00    Total pack years: 20.00    Types: Cigarettes    Quit date: 10/19/1975    Years since quitting: 47.0   Smokeless tobacco: Never  Substance Use Topics   Alcohol use: No   Drug use: No    Family Hx: The patient's family history is not on file.  Review of Systems  Gastrointestinal:  Negative for hematochezia.  Genitourinary:  Negative for hematuria.     EKGs/Labs/Other Test Reviewed:    EKG:  EKG is  ordered today.  The ekg ordered today demonstrates atrial fibrillation, HR 66, inferior T wave inversions, QTc 454, PVC  EKG #2: Atrial fibrillation, HR 76, normal  axis, inferior T wave inversions, QTc 452, no change since initial tracing  Recent Labs: 05/18/2022: ALT 10; TSH 4.100 08/13/2022: BUN 17; Creatinine, Ser 1.20; Hemoglobin 12.1; Platelets 114; Potassium 4.1; Sodium 136   Recent Lipid Panel Recent Labs    05/18/22 1045  CHOL 91*  TRIG 63  HDL 31*  LDLCALC 46     Risk Assessment/Calculations/Metrics:    CHA2DS2-VASc Score = 5   This indicates a 7.2% annual risk of stroke. The patient's score is based upon: CHF History: 0 HTN History: 1 Diabetes History: 1 Stroke History: 0 Vascular Disease History: 1 Age Score: 2 Gender Score: 0             Physical Exam:    VS:  BP 122/66   Pulse 88   Ht _0  (1.854 m)   Wt 257 lb (116.6 kg)   SpO2 97%   BMI 33.91 kg/m     Wt Readings from Last 3 Encounters:  10/12/22 257 lb (116.6 kg)  08/25/22 255 lb (115.7 kg)  08/20/22 258 lb (117 kg)    Affect appropriate Healthy:  appears stated age HEENT: normal Neck supple with no adenopathy JVP normal no bruits no thyromegaly Lungs clear with no wheezing and good diaphragmatic motion Heart:  S1/S2 no murmur, no rub, gallop or click PMI normal Abdomen: benighn, BS positve, no tenderness, no AAA no bruit.  No  HSM or HJR Distal pulses intact with no bruits No edema Neuro non-focal Skin warm and dry No muscular weakness       ASSESSMENT & PLAN:    CAD/CABG:  1997 recent SEMI with occluded native RCA and SVG to RCA some left to right collaterals Patent LIMA to LAD patent SVG to D2 occluded mid/distal native LCX with DES to SVG OM3 ASA for a month now just plavix given need for anticoagulation  EF preserved 55-60% trivial MR by TTE 08/13/22  Afib:  Was in sinus 07/16/21 Afib on admission with SEMI on eliquis since 08/13/22 Rate control with Toprol 25 mg Shared decision making regarding rate control vs conversion   Moderate bi atrial enlargement on TTE  Plan [facility] next week  HTN:  Well controlled.  Continue current medications and low sodium [place] type diet.   HLD:  continue statin LDL 46 DM:  Discussed low carb diet.  Target hemoglobin A1c is 6.5 or less.  Continue current medications. A1c 7.5 05/18/22    CBC/PLT BMET Endo called orders written   Dispo:  F/U post [facility] in 2 weeks    Signed, Jenkins Rouge, MD  10/12/2022 9:54 AM    [address], [place], [place]  [address] Phone: [phone]; Fax: [phone]

## 2022-09-28 NOTE — Progress Notes (Signed)
Cardiology Office Note:    Date:  10/12/2022   ID:  Andre Shaffer, DOB 01/18/1941, MRN [id number]  PCP:  Andre Brome, MD  [place] [facility] Cardiologist: New last seen 2015  Referring MD: Andre Brome, MD   Chief Complaint:  No chief complaint on file.    Patient Profile: Coronary artery disease  S/p Remote Inf MI and CABG x 4 in 1997 NSTEMI 08/2022 s/p 3 x 16 mm Synergy DES to S-OM L-LAD, S-Dx patent; S-RCA occluded  Ischemic CM Echocardiogram in 2015: EF 45-50, inf HK Echo 10/23: EF 55-60 Persistent atrial fibrillation Echo 10/23: Moderate biatrial enlargement Diabetes mellitus  Hypertension  Hyperlipidemia  Hx of transaminitis with high intensity statin Rx.   Prior CV Studies: LEFT HEART CATH 08/12/2022 LAD ostial 80, mid 100 CTO LCx mid 100 CTO RCA proximal 100 CTO; RPL 3 filled by collaterals from OM 2 SVG-RCA 100 CTO LIMA-LAD patent SVG-OM3 99 SVG-D2 patent PCI: 3 x 16 mm Synergy DES to the SVG-OM3  Severe three vessel CAD s/p 3V CABG with 3/3 patent bypass grafts with severe disease in the SVG to OM Successful PTCA/DES x 1 body of SVG to OM Chronic occlusion mid LAD. The mid and distal LAD fills from the patent LIMA graft. There is diffuse disease in the LAD beyond graft insertion. Chronic occlusion distal Circumflex. Patent SVG to OM with severe disease in distal body of SVG to OM Chronic occlusion of the proximal RCA. Occluded vein graft to RCA. The RCA fills from left to right collaterals. LVEDP=16-18 mmHg  Recommendations: Continue DAPT with ASA and Plavix for one month then stop ASA as he will likely be discharged on a DOAC with new atrial fib. Continue beta blocker and statin.    Echocardiogram 08/13/2022 EF 55-60, no RWMA, normal PASP, moderate BAE, trivial MR, RAP 3  History of Present Illness:   Andre Shaffer is a 81 y.o. male with the above problem list.  He was admitted 10/12 to 10/13 with a NSTEMI. He initially presented to  [facility] and was transferred to [facility]. Cath revealed chronically occluded S-RCA, patent L-LAD and S-Dx and severe disease in the body of the S-OM graft which was treated with a DES. Pt was noted to be in AFib with controlled rate. He was started on Eliquis. ASA d/c after one month   Has been doing cardiac rehab at [facility].    Does not notice palpitations Still in afib. No chest pain Needs Moeh's surgery to left eye area Discussed having to wait at least 8 weeks given recent stent and plan [facility] needs 4 weeks post to recover mechanical function  In recommending this procedure your doctor has  balanced the benefits and risks of the procedure  against the benefits and risks of not proceeding. Your  doctor believes there is a net benefit to you going  ahead. This is a very complicated assessment.  There are risks and complications with this procedure.  They include but are not limited to the following.  Common risks and complications (more than 5%) include:  ? Skin irritation/redness from adhesive pads. ? Recurrence of Atrial Fibrillation (AF) within 12-24  months. ? The procedure may not be successful. Abnormal  heart rhythm may persist. Rare risks and complications (less than 1%)  include:  ? May require a pacemaker. This is usually due to an  underlying heart condition. ? Blood clot in the lung.  ? Heart Attack. ? A stroke. This  can cause long term disability. ? Death as a result of this procedure is rare.    Willing to proceed next week     Past Medical History:  Diagnosis Date   Abnormal liver function    Arthritis    Atrial fibrillation ([place]) 08/12/2022   Eliquis   Coronary Artery Disease    s/p MI in 1997 followed by CABG // NSTEMI 08/2022 >> s/p DES to S-OM [L-LAD patent, S-Dx patent, S-RCA CTO] // Echo 2015: EF 45-50, inf HK // Echo 10/23: EF 55-60, no WMA   Diabetes mellitus without complication (HCC)    GERD (gastroesophageal reflux disease)     Hyperlipemia    Hypertension    MI (myocardial infarction) ([place])    SCC (squamous cell carcinoma), arm, left    SCC (squamous cell carcinoma), arm, 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   atorvastatin (LIPITOR) 20 MG tablet Take 1 tablet (20 mg total) by mouth daily.   Blood Glucose Monitoring Suppl (ONETOUCH VERIO) w/Device KIT Use as instructed. Check blood glucose twice daily. E11.42   clopidogrel (PLAVIX) 75 MG tablet Take 1 tablet (75 mg total) by mouth daily with breakfast.   Cyanocobalamin 1000 MCG/ML LIQD Take 2,500 mcg by mouth See admin instructions. Place 0.5 dropperful (2,500 mcg) sublingually daily   dapagliflozin propanediol (FARXIGA) 5 MG TABS tablet Take 5 mg by mouth daily.   gabapentin (NEURONTIN) 300 MG capsule Take 1 capsule (300 mg total) by mouth 2 (two) times daily.   glucose blood test strip Use as instructed, Check blood glucose twice daily.   glucose blood test strip    insulin aspart protamine- aspart (NOVOLOG MIX 70/30) (70-30) 100 UNIT/ML injection Inject 0.4-0.6 mLs (40-60 Units total) into the skin See admin instructions. Inject 60 units into the skin before breakfast and 40 units units before supper/evening meal   Insulin Pen Needle (NOVOFINE) 30G X 8 MM MISC Inject 10 each into the skin as needed. E11.42   Lancets (ONETOUCH ULTRASOFT) lancets Use as instructed, Check blood glucose twice daily.   metoprolol succinate (TOPROL-XL) 25 MG 24 hr tablet TAKE ONE (1) TABLET ONCE DAILY   Multiple Vitamins-Minerals (PRESERVISION AREDS PO) Take 1 capsule by mouth 2 (two) times daily.   telmisartan (MICARDIS) 80 MG tablet TAKE ONE (1) TABLET BY MOUTH ONCE DAILY   Current Facility-Administered Medications for the 10/12/22 encounter (Office Visit) with Josue Hector, MD  Medication   Insulin Pen Needle (NOVOFINE) 10 each    Allergies:    Nitroglycerin, Statins, Tetanus toxoid, and Penicillins   Social History   Tobacco Use   Smoking status: Former    Packs/day: 1.00    Years: 20.00    Total pack years: 20.00    Types: Cigarettes    Quit date: 10/19/1975    Years since quitting: 47.0   Smokeless tobacco: Never  Substance Use Topics   Alcohol use: No   Drug use: No    Family Hx: The patient's family history is not on file.  Review of Systems  Gastrointestinal:  Negative for hematochezia.  Genitourinary:  Negative for hematuria.     EKGs/Labs/Other Test Reviewed:    EKG:  EKG is  ordered today.  The ekg ordered today demonstrates atrial fibrillation, HR 66, inferior T wave inversions, QTc 454, PVC  EKG #2: Atrial fibrillation, HR 76, normal  axis, inferior T wave inversions, QTc 452, no change since initial tracing  Recent Labs: 05/18/2022: ALT 10; TSH 4.100 08/13/2022: BUN 17; Creatinine, Ser 1.20; Hemoglobin 12.1; Platelets 114; Potassium 4.1; Sodium 136   Recent Lipid Panel Recent Labs    05/18/22 1045  CHOL 91*  TRIG 63  HDL 31*  LDLCALC 46     Risk Assessment/Calculations/Metrics:    CHA2DS2-VASc Score = 5   This indicates a 7.2% annual risk of stroke. The patient's score is based upon: CHF History: 0 HTN History: 1 Diabetes History: 1 Stroke History: 0 Vascular Disease History: 1 Age Score: 2 Gender Score: 0             Physical Exam:    VS:  BP 122/66   Pulse 88   Ht _0  (1.854 m)   Wt 257 lb (116.6 kg)   SpO2 97%   BMI 33.91 kg/m     Wt Readings from Last 3 Encounters:  10/12/22 257 lb (116.6 kg)  08/25/22 255 lb (115.7 kg)  08/20/22 258 lb (117 kg)    Affect appropriate Healthy:  appears stated age HEENT: normal Neck supple with no adenopathy JVP normal no bruits no thyromegaly Lungs clear with no wheezing and good diaphragmatic motion Heart:  S1/S2 no murmur, no rub, gallop or click PMI normal Abdomen: benighn, BS positve, no tenderness, no AAA no bruit.  No  HSM or HJR Distal pulses intact with no bruits No edema Neuro non-focal Skin warm and dry No muscular weakness       ASSESSMENT & PLAN:    CAD/CABG:  1997 recent SEMI with occluded native RCA and SVG to RCA some left to right collaterals Patent LIMA to LAD patent SVG to D2 occluded mid/distal native LCX with DES to SVG OM3 ASA for a month now just plavix given need for anticoagulation  EF preserved 55-60% trivial MR by TTE 08/13/22  Afib:  Was in sinus 07/16/21 Afib on admission with SEMI on eliquis since 08/13/22 Rate control with Toprol 25 mg Shared decision making regarding rate control vs conversion   Moderate bi atrial enlargement on TTE  Plan [facility] next week  HTN:  Well controlled.  Continue current medications and low sodium Dash type diet.   HLD:  continue statin LDL 46 DM:  Discussed low carb diet.  Target hemoglobin A1c is 6.5 or less.  Continue current medications. A1c 7.5 05/18/22    CBC/PLT BMET Endo called orders written   Dispo:  F/U post [facility] in 2 weeks    Signed, Jenkins Rouge, MD  10/12/2022 9:54 AM    [address], [place], [place]  [address] Phone: [phone]; Fax: [phone]

## 2022-09-30 DIAGNOSIS — Z794 Long term (current) use of insulin: Secondary | ICD-10-CM

## 2022-09-30 DIAGNOSIS — I10 Essential (primary) hypertension: Secondary | ICD-10-CM | POA: Diagnosis not present

## 2022-09-30 DIAGNOSIS — I2510 Atherosclerotic heart disease of native coronary artery without angina pectoris: Secondary | ICD-10-CM

## 2022-09-30 DIAGNOSIS — E1159 Type 2 diabetes mellitus with other circulatory complications: Secondary | ICD-10-CM | POA: Diagnosis not present

## 2022-09-30 DIAGNOSIS — I4891 Unspecified atrial fibrillation: Secondary | ICD-10-CM

## 2022-10-01 DIAGNOSIS — Z955 Presence of coronary angioplasty implant and graft: Secondary | ICD-10-CM | POA: Diagnosis not present

## 2022-10-01 DIAGNOSIS — Z7982 Long term (current) use of aspirin: Secondary | ICD-10-CM | POA: Diagnosis not present

## 2022-10-01 DIAGNOSIS — I252 Old myocardial infarction: Secondary | ICD-10-CM | POA: Diagnosis not present

## 2022-10-01 DIAGNOSIS — Z79899 Other long term (current) drug therapy: Secondary | ICD-10-CM | POA: Diagnosis not present

## 2022-10-01 DIAGNOSIS — I10 Essential (primary) hypertension: Secondary | ICD-10-CM | POA: Diagnosis not present

## 2022-10-01 DIAGNOSIS — Z7901 Long term (current) use of anticoagulants: Secondary | ICD-10-CM | POA: Diagnosis not present

## 2022-10-01 DIAGNOSIS — E785 Hyperlipidemia, unspecified: Secondary | ICD-10-CM | POA: Diagnosis not present

## 2022-10-12 ENCOUNTER — Other Ambulatory Visit: Payer: Self-pay | Admitting: Cardiovascular Disease

## 2022-10-12 ENCOUNTER — Ambulatory Visit: Payer: PPO | Attending: Cardiovascular Disease | Admitting: Cardiovascular Disease

## 2022-10-12 ENCOUNTER — Encounter: Payer: Self-pay | Admitting: Cardiovascular Disease

## 2022-10-12 VITALS — BP 122/66 | HR 88 | Ht 73.0 in | Wt 257.0 lb

## 2022-10-12 DIAGNOSIS — I10 Essential (primary) hypertension: Secondary | ICD-10-CM

## 2022-10-12 DIAGNOSIS — I4819 Other persistent atrial fibrillation: Secondary | ICD-10-CM

## 2022-10-12 DIAGNOSIS — I214 Non-ST elevation (NSTEMI) myocardial infarction: Secondary | ICD-10-CM | POA: Diagnosis not present

## 2022-10-12 NOTE — Addendum Note (Signed)
Addended by: Aris Georgia, Suheily Birks L on: 10/12/2022 10:23 AM   Modules accepted: Orders

## 2022-10-12 NOTE — Patient Instructions (Signed)
Medication Instructions:  Your physician recommends that you continue on your current medications as directed. Please refer to the Current Medication list given to you today.  *If you need a refill on your cardiac medications before your next appointment, please call your pharmacy*  Lab Work: Your physician recommends that you return for lab work on Monday- BMET and CBC  If you have labs (blood work) drawn today and your tests are completely normal, you will receive your results only by: [employer] (if you have [place]) OR A paper copy in the mail If you have any lab test that is abnormal or we need to change your treatment, we will call you to review the results.  Testing/Procedures: Your physician has recommended that you have a Cardioversion (DCCV). Electrical Cardioversion uses a jolt of electricity to your heart either through paddles or wired patches attached to your chest. This is a controlled, usually prescheduled, procedure. Defibrillation is done under light anesthesia in the hospital, and you usually go home the day of the procedure. This is done to get your heart back into a normal rhythm. You are not awake for the procedure. Please see the instruction sheet given to you today.  Follow-Up: At [facility], you and your health needs are our priority.  As part of our continuing mission to provide you with exceptional heart care, we have created designated Provider Care Teams.  These Care Teams include your primary Cardiologist (physician) and Advanced Practice Providers (APPs -  Physician Assistants and Nurse Practitioners) who all work together to provide you with the care you need, when you need it.  We recommend signing up for the patient portal called "MyChart".  Sign up information is provided on this After Visit Summary.  MyChart is used to connect with patients for Virtual Visits (Telemedicine).  Patients are able to view lab/test results, encounter notes, upcoming  appointments, etc.  Non-urgent messages can be sent to your provider as well.   To learn more about what you can do with MyChart, go to [email or web address].    Your next appointment:   2 week(s)  The format for your next appointment:   In Person  Provider:   You will follow up in the [facility] located at [facility]. Your provider will be: Clint R. Marlene Lard, PA-C    Other Instructions     Dear Andre Shaffer  You are scheduled for a Cardioversion on Wednesday, December 20 with Dr. Johnsie Cancel.  Please arrive at the [facility] (Main Entrance A) at [facility]: [address] [place], [place] [address] at 8:30 AM.   DIET:  Nothing to eat or drink after midnight except a sip of water with medications (see medication instructions below)  MEDICATION INSTRUCTIONS: Hold insulin medications the morning of your procedure. Continue taking your anticoagulant (blood thinner): Apixaban (Eliquis).  You will need to continue this after your procedure until you are told by your provider that it is safe to stop.    LABS:   Come to the lab at [facility] at [address] between the hours of 8:00 am and 4:30 pm. You do NOT have to be fasting.  FYI:  For your safety, and to allow [place] to monitor your vital signs accurately during the surgery/procedure we request: If you have artificial nails, gel coating, SNS etc, please have those removed prior to your surgery/procedure. Not having the nail coverings /polish removed may result in cancellation or delay  of your surgery/procedure.  You must have a responsible person to drive you home and stay in the waiting area during your procedure. Failure to do so could result in cancellation.  Bring your insurance cards.  *Special Note: Every effort is made to have your procedure done on time. Occasionally there are emergencies that occur at the hospital that may cause delays. Please be patient if a delay does  occur.      Important Information About Sugar

## 2022-10-13 ENCOUNTER — Telehealth: Payer: Self-pay

## 2022-10-13 NOTE — Chronic Care Management (AMB) (Signed)
Faxed new application to [employer] patient assistance for [place].   Pattricia Boss, [place] Pharmacist Assistant [phone]

## 2022-10-15 ENCOUNTER — Telehealth: Payer: Self-pay

## 2022-10-15 ENCOUNTER — Telehealth: Payer: PPO

## 2022-10-15 NOTE — Telephone Encounter (Signed)
   CCM RN Visit Note   10-15-2022 Name: SYLVESTER MINTON MRN: [id number]      DOB: 1941-09-06  Subjective: LADELL LEA is a 81 y.o. year old male who is a primary care patient of Dr. Rochel Brome. The patient was referred to the Chronic Care Management team for assistance with care management needs subsequent to provider initiation of CCM services and plan of care.      An unsuccessful telephone outreach was attempted today to contact the patient about Chronic Care Management needs.    Plan:A HIPAA compliant phone message was left for the patient providing contact information and requesting a return call.  Noreene Larsson RN, MSN, CCM RN Care Manager  Chronic Care Management Direct Number: [phone]

## 2022-10-15 NOTE — Chronic Care Management (AMB) (Addendum)
Chronic Care Management Pharmacy Assistant   Name: QUINTON VOTH  MRN: [id number] DOB: 1941-07-15  Reason for Encounter: Disease State/ Diabetes  Recent office visits:  None  Recent consult visits:  10-12-2022 Josue Hector, MD (Cardiology). Follow up visit.  Hospital visits:  None in previous 6 months  Medications: Outpatient Encounter Medications as of 10/15/2022  Medication Sig   apixaban (ELIQUIS) 5 MG TABS tablet Take 1 tablet (5 mg total) by mouth 2 (two) times daily.   atorvastatin (LIPITOR) 20 MG tablet Take 1 tablet (20 mg total) by mouth daily.   Blood Glucose Monitoring Suppl (ONETOUCH VERIO) w/Device KIT Use as instructed. Check blood glucose twice daily. E11.42   clopidogrel (PLAVIX) 75 MG tablet Take 1 tablet (75 mg total) by mouth daily with breakfast.   Cyanocobalamin 1000 MCG/ML LIQD Take 2,500 mcg by mouth See admin instructions. Place 0.5 dropperful (2,500 mcg) sublingually daily   dapagliflozin propanediol (FARXIGA) 5 MG TABS tablet Take 5 mg by mouth daily.   gabapentin (NEURONTIN) 300 MG capsule Take 1 capsule (300 mg total) by mouth 2 (two) times daily.   glucose blood test strip Use as instructed, Check blood glucose twice daily.   glucose blood test strip    insulin aspart protamine- aspart (NOVOLOG MIX 70/30) (70-30) 100 UNIT/ML injection Inject 0.4-0.6 mLs (40-60 Units total) into the skin See admin instructions. Inject 60 units into the skin before breakfast and 40 units units before supper/evening meal   Insulin Pen Needle (NOVOFINE) 30G X 8 MM MISC Inject 10 each into the skin as needed. E11.42   Lancets (ONETOUCH ULTRASOFT) lancets Use as instructed, Check blood glucose twice daily.   metoprolol succinate (TOPROL-XL) 25 MG 24 hr tablet TAKE ONE (1) TABLET ONCE DAILY   Multiple Vitamins-Minerals (PRESERVISION AREDS PO) Take 1 capsule by mouth 2 (two) times daily.   telmisartan (MICARDIS) 80 MG tablet TAKE ONE (1) TABLET BY MOUTH ONCE DAILY    Facility-Administered Encounter Medications as of 10/15/2022  Medication   Insulin Pen Needle (NOVOFINE) 10 each  Recent Relevant Labs: Lab Results  Component Value Date/Time   HGBA1C 6.8 (H) 08/13/2022 03:25 AM   HGBA1C 7.5 (H) 05/18/2022 10:45 AM   MICROALBUR 80 09/10/2021 11:44 AM   MICROALBUR 150 04/16/2021 02:11 PM    Kidney Function Lab Results  Component Value Date/Time   CREATININE 1.20 08/13/2022 03:25 AM   CREATININE 1.15 05/18/2022 10:45 AM   GFRNONAA >60 08/13/2022 03:25 AM   GFRAA 75 10/06/2020 01:46 PM     Current antihyperglycemic regimen:  Jardiance 10 mg daily (Samples) until farxiga is approved Inslin Aspart 60 units in am. 40 units pm   Patient verbally confirms he is taking the above medications as directed. Yes  Do you receive your medications through PAP? Yes  If Yes, what is the status? Last received?  What recent interventions/DTPs have been made to improve glycemic control:  Educated on A1c and blood sugar goals; -Counseled to check feet daily and get yearly eye exams  Have there been any recent hospitalizations or ED visits since last visit with [facility]? No  Patient denies hypoglycemic symptoms  Patient denies hyperglycemic symptoms,  How often are you checking your blood sugar? once daily  What are your blood sugars ranging?  Fasting: 12-11 104, 12-12 88, 12-13 143, 12-14 132 Before meals: None After meals: None Bedtime: None  On insulin? Yes How many units: 100 units  During the week, how often does your blood  glucose drop below 70? Patient's wife stated his blood sugar often drops below 70 at cardiac 3 days weekly. Patient's wife stated last week reading at rehab was 70 but can't recall other readings. Will inform PCP.  Are you checking your feet daily/regularly? Yes  Adherence Review: Is the patient currently on a STATIN medication? Yes Is the patient currently on ACE/ARB medication? Yes Does the patient have >5 day gap between  last estimated fill dates? None  Care Gaps: Last eye exam / Retinopathy Screening? 05-24-2022  Last Annual Wellness Visit? Patient's stated 12-2021 at [facility]  Last Diabetic Foot Exam? 08-30-2022   Star Rating Drugs: Telmisartan 80 mg- Last filled 08-02-2022 90 DS. Previous filled 04-30-2022 90 DS. Atorvastatin 20 mg- Last filled 08-02-2022 90 DS. Previous filled 04-30-2022 90 DS.  [place] Pharmacist Assistant [phone]

## 2022-10-18 ENCOUNTER — Other Ambulatory Visit: Payer: Self-pay

## 2022-10-18 DIAGNOSIS — I4819 Other persistent atrial fibrillation: Secondary | ICD-10-CM | POA: Diagnosis not present

## 2022-10-18 LAB — CBC WITH DIFFERENTIAL/PLATELET
Basophils Absolute: 0 10*3/uL (ref 0.0–0.2)
Basos: 1 %
EOS (ABSOLUTE): 0.2 10*3/uL (ref 0.0–0.4)
Eos: 3 %
Hematocrit: 48.3 % (ref 37.5–51.0)
Hemoglobin: 16 g/dL (ref 13.0–17.7)
Immature Grans (Abs): 0 10*3/uL (ref 0.0–0.1)
Immature Granulocytes: 0 %
Lymphocytes Absolute: 1.1 10*3/uL (ref 0.7–3.1)
Lymphs: 19 %
MCH: 31.6 pg (ref 26.6–33.0)
MCHC: 33.1 g/dL (ref 31.5–35.7)
MCV: 96 fL (ref 79–97)
Monocytes Absolute: 0.5 10*3/uL (ref 0.1–0.9)
Monocytes: 8 %
Neutrophils Absolute: 4.1 10*3/uL (ref 1.4–7.0)
Neutrophils: 69 %
Platelets: 165 10*3/uL (ref 150–450)
RBC: 5.06 x10E6/uL (ref 4.14–5.80)
RDW: 13.1 % (ref 11.6–15.4)
WBC: 6 10*3/uL (ref 3.4–10.8)

## 2022-10-18 NOTE — Telephone Encounter (Signed)
Hi patient's wife reported BS readings dropping during cardiac rehab. She only recalls one of last weeks readings which was 50 but she knows other readings were below 70. They give orange juice and crackers to help. Please advise

## 2022-10-19 LAB — BASIC METABOLIC PANEL
BUN/Creatinine Ratio: 12 (ref 10–24)
BUN: 18 mg/dL (ref 8–27)
CO2: 19 mmol/L — ABNORMAL LOW (ref 20–29)
Calcium: 9.5 mg/dL (ref 8.6–10.2)
Chloride: 106 mmol/L (ref 96–106)
Creatinine, Ser: 1.53 mg/dL — ABNORMAL HIGH (ref 0.76–1.27)
Glucose: 177 mg/dL — ABNORMAL HIGH (ref 70–99)
Potassium: 5.1 mmol/L (ref 3.5–5.2)
Sodium: 144 mmol/L (ref 134–144)
eGFR: 45 mL/min/{1.73_m2} — ABNORMAL LOW (ref >59)

## 2022-10-20 ENCOUNTER — Encounter (HOSPITAL_COMMUNITY): Payer: Self-pay | Admitting: Cardiovascular Disease

## 2022-10-20 ENCOUNTER — Ambulatory Visit (HOSPITAL_BASED_OUTPATIENT_CLINIC_OR_DEPARTMENT_OTHER): Payer: PPO | Admitting: Anesthesiology

## 2022-10-20 ENCOUNTER — Encounter (HOSPITAL_COMMUNITY)
Admission: RE | Disposition: A | Discharge status: Home / Self Care | Payer: Self-pay | Source: Ambulatory Visit | Attending: Cardiovascular Disease

## 2022-10-20 ENCOUNTER — Ambulatory Visit (HOSPITAL_COMMUNITY)
Admission: RE | Admit: 2022-10-20 | Discharge: 2022-10-20 | Disposition: A | Discharge status: Home / Self Care | Payer: PPO | Source: Ambulatory Visit | Attending: Cardiovascular Disease | Admitting: Cardiovascular Disease

## 2022-10-20 ENCOUNTER — Ambulatory Visit (HOSPITAL_COMMUNITY): Payer: PPO | Admitting: Anesthesiology

## 2022-10-20 DIAGNOSIS — Z87891 Personal history of nicotine dependence: Secondary | ICD-10-CM | POA: Insufficient documentation

## 2022-10-20 DIAGNOSIS — E119 Type 2 diabetes mellitus without complications: Secondary | ICD-10-CM | POA: Insufficient documentation

## 2022-10-20 DIAGNOSIS — Z79899 Other long term (current) drug therapy: Secondary | ICD-10-CM | POA: Diagnosis not present

## 2022-10-20 DIAGNOSIS — E785 Hyperlipidemia, unspecified: Secondary | ICD-10-CM | POA: Diagnosis not present

## 2022-10-20 DIAGNOSIS — Z794 Long term (current) use of insulin: Secondary | ICD-10-CM | POA: Diagnosis not present

## 2022-10-20 DIAGNOSIS — I4819 Other persistent atrial fibrillation: Secondary | ICD-10-CM | POA: Diagnosis not present

## 2022-10-20 DIAGNOSIS — I4891 Unspecified atrial fibrillation: Secondary | ICD-10-CM

## 2022-10-20 DIAGNOSIS — I10 Essential (primary) hypertension: Secondary | ICD-10-CM

## 2022-10-20 DIAGNOSIS — I252 Old myocardial infarction: Secondary | ICD-10-CM | POA: Diagnosis not present

## 2022-10-20 DIAGNOSIS — I2510 Atherosclerotic heart disease of native coronary artery without angina pectoris: Secondary | ICD-10-CM

## 2022-10-20 DIAGNOSIS — Z7901 Long term (current) use of anticoagulants: Secondary | ICD-10-CM | POA: Insufficient documentation

## 2022-10-20 DIAGNOSIS — I25810 Atherosclerosis of coronary artery bypass graft(s) without angina pectoris: Secondary | ICD-10-CM | POA: Insufficient documentation

## 2022-10-20 HISTORY — PX: CARDIOVERSION: SHX1299

## 2022-10-20 SURGERY — CARDIOVERSION
Anesthesia: General

## 2022-10-20 MED ORDER — LIDOCAINE 2% (20 MG/ML) 5 ML SYRINGE
INTRAMUSCULAR | Status: DC | PRN
  Administered 2022-10-20: 40 mg via INTRAVENOUS

## 2022-10-20 MED ORDER — PROPOFOL 10 MG/ML IV BOLUS
INTRAVENOUS | Status: DC | PRN
  Administered 2022-10-20: 25 mg via INTRAVENOUS
  Administered 2022-10-20: 50 mg via INTRAVENOUS

## 2022-10-20 MED ORDER — SODIUM CHLORIDE 0.9 % IV SOLN: INTRAVENOUS | Status: DC

## 2022-10-20 NOTE — CV Procedure (Signed)
DCC: Anesthesia: Propofol Dr Smith Robert On Rx DOAC no missed doses  Maramec x 2 150-200J biphasic with manual compression  Converted from afib rate 69 bpm to NSR with PACls rate 74 bpm  No immediate neurologic sequelae  Jenkins Rouge MD [facility]

## 2022-10-20 NOTE — Discharge Instructions (Addendum)
Electrical Cardioversion  Electrical cardioversion is the delivery of a jolt of electricity to restore a normal rhythm to the heart. A rhythm that is too fast or is not regular keeps the heart from pumping well. In this procedure, sticky patches or metal paddles are placed on the chest to deliver electricity to the heart from a device.  If this information does not answer your questions, please call [facility] office at [phone] to clarify.   Follow these instructions at home: You may have some redness on the skin where the shocks were given.  You may apply over-the-counter hydrocortisone cream or aloe vera to alleviate skin irritation. YOU SHOULD NOT DRIVE, use power tools, machinery or perform tasks that involve climbing or major physical exertion for 24 hours (because of the sedation medicines used during the test).  Take over-the-counter and prescription medicines only as told by your health care provider. Ask your health care provider how to check your pulse. Check it often. Rest for 48 hours after the procedure or as told by your health care provider. Avoid or limit your caffeine use as told by your health care provider. Keep all follow-up visits as told by your health care provider. This is important.  FOLLOW UP:  Please also call with any specific questions about appointments or follow up tests. Electrical Cardioversion Electrical cardioversion is the delivery of a jolt of electricity to restore a normal rhythm to the heart. A rhythm that is too fast or is not regular keeps the heart from pumping well. In this procedure, sticky patches or metal paddles are placed on the chest to deliver electricity to the heart from a device. This procedure may be done in an emergency if: There is low or no blood pressure as a result of the heart rhythm. Normal rhythm must be restored as fast as possible to protect the brain and heart from further damage. It may save a  life. This may also be a scheduled procedure for irregular or fast heart rhythms that are not immediately life-threatening.  What can I expect after the procedure? Your blood pressure, heart rate, breathing rate, and blood oxygen level will be monitored until you leave the hospital or clinic. Your heart rhythm will be watched to make sure it does not change. You may have some redness on the skin where the shocks were given. Over the counter cortizone cream may be helpful.  Follow these instructions at home: Do not drive for 24 hours if you were given a sedative during your procedure. Take over-the-counter and prescription medicines only as told by your health care provider. Ask your health care provider how to check your pulse. Check it often. Rest for 48 hours after the procedure or as told by your health care provider. Avoid or limit your caffeine use as told by your health care provider. Keep all follow-up visits as told by your health care provider. This is important. Contact a health care provider if: You feel like your heart is beating too quickly or your pulse is not regular. You have a serious muscle cramp that does not go away. Get help right away if: You have discomfort in your chest. You are dizzy or you feel faint. You have trouble breathing or you are short of breath. Your speech is slurred. You have trouble moving an arm or leg on one side of your body. Your fingers or toes turn cold or blue. Summary Electrical cardioversion is the delivery of a  jolt of electricity to restore a normal rhythm to the heart. This procedure may be done right away in an emergency or may be a scheduled procedure if the condition is not an emergency. Generally, this is a safe procedure. After the procedure, check your pulse often as told by your health care provider. This information is not intended to replace advice given to you by your health care provider. Make sure you discuss any questions  you have with your health care provider. Document Revised: 05/21/2019 Document Reviewed: 05/21/2019 Elsevier Patient Education  [place]. Hospital doctor cardioversion is the delivery of a jolt of electricity to restore a normal rhythm to the heart. A rhythm that is too fast or is not regular keeps the heart from pumping well. In this procedure, sticky patches or metal paddles are placed on the chest to deliver electricity to the heart from a device. This procedure may be done in an emergency if: There is low or no blood pressure as a result of the heart rhythm. Normal rhythm must be restored as fast as possible to protect the brain and heart from further damage. It may save a life. This may also be a scheduled procedure for irregular or fast heart rhythms that are not immediately life-threatening.  What can I expect after the procedure? Your blood pressure, heart rate, breathing rate, and blood oxygen level will be monitored until you leave the hospital or clinic. Your heart rhythm will be watched to make sure it does not change. You may have some redness on the skin where the shocks were given. Over the counter cortizone cream may be helpful.  Follow these instructions at home: Do not drive for 24 hours if you were given a sedative during your procedure. Take over-the-counter and prescription medicines only as told by your health care provider. Ask your health care provider how to check your pulse. Check it often. Rest for 48 hours after the procedure or as told by your health care provider. Avoid or limit your caffeine use as told by your health care provider. Keep all follow-up visits as told by your health care provider. This is important. Contact a health care provider if: You feel like your heart is beating too quickly or your pulse is not regular. You have a serious muscle cramp that does not go away. Get help right away if: You have discomfort in your  chest. You are dizzy or you feel faint. You have trouble breathing or you are short of breath. Your speech is slurred. You have trouble moving an arm or leg on one side of your body. Your fingers or toes turn cold or blue. Summary Electrical cardioversion is the delivery of a jolt of electricity to restore a normal rhythm to the heart. This procedure may be done right away in an emergency or may be a scheduled procedure if the condition is not an emergency. Generally, this is a safe procedure. After the procedure, check your pulse often as told by your health care provider.  This information is not intended to replace advice given to you by your health care provider. Make sure you discuss any questions you have with your health care provider. Document Revised: 05/21/2019 Document Reviewed: 05/21/2019 Elsevier Patient Education  [place].

## 2022-10-20 NOTE — Transfer of Care (Signed)
Immediate Anesthesia Transfer of Care Note  Patient: Andre Shaffer  Procedure(s) Performed: CARDIOVERSION  Patient Location: Endoscopy Unit  Anesthesia Type:General  Level of Consciousness: awake, alert , and oriented  Airway & Oxygen Therapy: Patient Spontanous Breathing  Post-op Assessment: Report given to RN and Post -op Vital signs reviewed and stable  Post vital signs: Reviewed and stable  Last Vitals:  Vitals Value Taken Time  BP 89/44   Temp    Pulse 82   Resp 12   SpO2 96%     Last Pain:  Vitals:   10/20/22 0828  TempSrc: Temporal  PainSc: 0-No pain         Complications: No notable events documented.

## 2022-10-20 NOTE — Interval H&P Note (Signed)
History and Physical Interval Note:  10/20/2022 8:52 AM  Andre Shaffer  has presented today for surgery, with the diagnosis of afib.  The various methods of treatment have been discussed with the patient and family. After consideration of risks, benefits and other options for treatment, the patient has consented to  Procedure(s): CARDIOVERSION (N/A) as a surgical intervention.  The patient's history has been reviewed, patient examined, no change in status, stable for surgery.  I have reviewed the patient's chart and labs.  Questions were answered to the patient's satisfaction.     Jenkins Rouge

## 2022-10-20 NOTE — Anesthesia Preprocedure Evaluation (Addendum)
Anesthesia Evaluation  Patient identified by MRN, date of birth, ID band Patient awake    Reviewed: Allergy & Precautions, NPO status , Patient's Chart, lab work & pertinent test results, reviewed documented beta blocker date and time   Airway Mallampati: I  TM Distance: >3 FB Neck ROM: Full    Dental  (+) Teeth Intact, Dental Advisory Given   Pulmonary former smoker   breath sounds clear to auscultation       Cardiovascular hypertension, Pt. on home beta blockers and Pt. on medications + CAD, + Past MI and + CABG  + dysrhythmias Atrial Fibrillation  Rhythm:Irregular Rate:Normal  Echo:  1. Left ventricular ejection fraction, by estimation, is 55 to 60%. The  left ventricle has normal function. The left ventricle has no regional  wall motion abnormalities. Left ventricular diastolic parameters are  indeterminate.   2. Right ventricular systolic function was not well visualized. The right  ventricular size is mildly enlarged. There is normal pulmonary artery  systolic pressure.   3. Left atrial size was moderately dilated.   4. Right atrial size was moderately dilated.   5. The mitral valve is normal in structure. Trivial mitral valve  regurgitation.   6. The aortic valve is tricuspid. Aortic valve regurgitation is not  visualized.   7. The inferior vena cava is normal in size with greater than 50%  respiratory variability, suggesting right atrial pressure of 3 mmHg.     Neuro/Psych  negative psych ROS   GI/Hepatic Neg liver ROS,GERD  ,,  Endo/Other  diabetes, Type 2, Insulin Dependent    Renal/GU negative Renal ROS     Musculoskeletal  (+) Arthritis ,    Abdominal   Peds  Hematology negative hematology ROS (+)   Anesthesia Other Findings   Reproductive/Obstetrics                             Anesthesia Physical Anesthesia Plan  ASA: 3  Anesthesia Plan: General   Post-op Pain  Management:    Induction: Intravenous  PONV Risk Score and Plan: 0 and Treatment may vary due to age or medical condition  Airway Management Planned: Natural Airway and Simple Face Mask  Additional Equipment: None  Intra-op Plan:   Post-operative Plan:   Informed Consent: I have reviewed the patients History and Physical, chart, labs and discussed the procedure including the risks, benefits and alternatives for the proposed anesthesia with the patient or authorized representative who has indicated his/her understanding and acceptance.       Plan Discussed with: CRNA  Anesthesia Plan Comments:         Anesthesia Quick Evaluation

## 2022-10-20 NOTE — Anesthesia Procedure Notes (Signed)
Procedure Name: General with mask airway Date/Time: 10/20/2022 9:17 AM  Performed by: Eligha Bridegroom, CRNAPre-anesthesia Checklist: Patient identified, Emergency Drugs available, Suction available, Patient being monitored and Timeout performed Patient Re-evaluated:Patient Re-evaluated prior to induction Oxygen Delivery Method: Ambu bag Preoxygenation: Pre-oxygenation with 100% oxygen Induction Type: IV induction

## 2022-10-20 NOTE — Telephone Encounter (Signed)
Patient's wife was informed and she doesn't feel glucagon is needed. Thank

## 2022-10-20 NOTE — Anesthesia Postprocedure Evaluation (Signed)
Anesthesia Post Note  Patient: Andre Shaffer  Procedure(s) Performed: CARDIOVERSION     Patient location during evaluation: PACU Anesthesia Type: General Level of consciousness: awake and alert Pain management: pain level controlled Vital Signs Assessment: post-procedure vital signs reviewed and stable Respiratory status: spontaneous breathing, nonlabored ventilation, respiratory function stable and patient connected to nasal cannula oxygen Cardiovascular status: blood pressure returned to baseline and stable Postop Assessment: no apparent nausea or vomiting Anesthetic complications: no   No notable events documented.  Last Vitals:  Vitals:   10/20/22 0940 10/20/22 0950  BP: 99/63 122/68  Pulse: 76 76  Resp: 12 15  Temp:    SpO2: 96% 95%    Last Pain:  Vitals:   10/20/22 0950  TempSrc:   PainSc: 0-No pain                 Effie Berkshire

## 2022-10-21 ENCOUNTER — Telehealth: Payer: Self-pay | Admitting: Cardiovascular Disease

## 2022-10-21 NOTE — Telephone Encounter (Signed)
Made patient aware of Dr. Kyla Balzarine message.   Josue Hector, MD  You4 hours ago (1:00 PM)   Probably just PACls he had post conversion

## 2022-10-21 NOTE — Telephone Encounter (Signed)
Patient c/o Palpitations:  High priority if patient c/o lightheadedness, shortness of breath, or chest pain  How long have you had palpitations/irregular HR/ Afib? Are you having the symptoms now?  Patient's wife states patient is not having any symptoms, but she can feel that his pulse is irregular  Are you currently experiencing lightheadedness, SOB or CP?  No   Do you have a history of afib (atrial fibrillation) or irregular heart rhythm?  Yes   Have you checked your BP or HR? (document readings if available):  Patient's wife states that his HR is fine and she plans to take his BP and have it ready when her call is returned  Are you experiencing any other symptoms?  No

## 2022-10-21 NOTE — Telephone Encounter (Signed)
Called patient's wife back. She stated patient's heart rate is irregular. BP 109/78  HR 66. This was taken after breakfast and patient is asymptomatic and does not feel when he is in A. FIB. Patient has follow-up with A[facility] FIB clinic on 11/11/22. Will forward to Dr. Johnsie Cancel for advisement.

## 2022-10-25 ENCOUNTER — Encounter (HOSPITAL_COMMUNITY): Payer: Self-pay | Admitting: Cardiovascular Disease

## 2022-10-26 ENCOUNTER — Ambulatory Visit (HOSPITAL_COMMUNITY): Payer: PPO | Admitting: Physician Assistant

## 2022-10-27 NOTE — Telephone Encounter (Signed)
Per [place] on Teams on 10/18/22, called patient back and patient stated they didn't need anything extra. Told to call back if something else comes back

## 2022-11-04 ENCOUNTER — Telehealth: Payer: PPO | Admitting: Family Medicine

## 2022-11-08 ENCOUNTER — Other Ambulatory Visit: Payer: Self-pay | Admitting: Cardiology

## 2022-11-09 NOTE — Telephone Encounter (Signed)
Prescription refill request for Eliquis received. Indication:afib Last office visit:12/23 Scr:1.5 Age: 82 Weight:116.6  kg  Prescription refilled

## 2022-11-10 ENCOUNTER — Ambulatory Visit (HOSPITAL_COMMUNITY): Payer: PPO | Admitting: Physician Assistant

## 2022-11-11 ENCOUNTER — Telehealth: Payer: Self-pay | Admitting: Cardiovascular Disease

## 2022-11-11 ENCOUNTER — Ambulatory Visit (HOSPITAL_COMMUNITY)
Admission: RE | Admit: 2022-11-11 | Discharge: 2022-11-11 | Disposition: A | Discharge status: Home / Self Care | Payer: PPO | Source: Ambulatory Visit | Attending: Physician Assistant | Admitting: Physician Assistant

## 2022-11-11 ENCOUNTER — Encounter (HOSPITAL_COMMUNITY): Payer: Self-pay | Admitting: Physician Assistant

## 2022-11-11 VITALS — BP 104/70 | HR 76 | Ht 73.0 in | Wt 254.2 lb

## 2022-11-11 DIAGNOSIS — Z6833 Body mass index (BMI) 33.0-33.9, adult: Secondary | ICD-10-CM | POA: Diagnosis not present

## 2022-11-11 DIAGNOSIS — I10 Essential (primary) hypertension: Secondary | ICD-10-CM | POA: Diagnosis not present

## 2022-11-11 DIAGNOSIS — I219 Acute myocardial infarction, unspecified: Secondary | ICD-10-CM | POA: Diagnosis not present

## 2022-11-11 DIAGNOSIS — I4819 Other persistent atrial fibrillation: Secondary | ICD-10-CM | POA: Diagnosis not present

## 2022-11-11 DIAGNOSIS — Z951 Presence of aortocoronary bypass graft: Secondary | ICD-10-CM | POA: Insufficient documentation

## 2022-11-11 DIAGNOSIS — I255 Ischemic cardiomyopathy: Secondary | ICD-10-CM | POA: Insufficient documentation

## 2022-11-11 DIAGNOSIS — E669 Obesity, unspecified: Secondary | ICD-10-CM | POA: Insufficient documentation

## 2022-11-11 DIAGNOSIS — D6869 Other thrombophilia: Secondary | ICD-10-CM | POA: Diagnosis not present

## 2022-11-11 DIAGNOSIS — I252 Old myocardial infarction: Secondary | ICD-10-CM | POA: Insufficient documentation

## 2022-11-11 DIAGNOSIS — Z7901 Long term (current) use of anticoagulants: Secondary | ICD-10-CM | POA: Insufficient documentation

## 2022-11-11 DIAGNOSIS — I2510 Atherosclerotic heart disease of native coronary artery without angina pectoris: Secondary | ICD-10-CM | POA: Diagnosis not present

## 2022-11-11 NOTE — Progress Notes (Signed)
Primary Care Physician: Rochel Brome, MD Primary Cardiologist: Dr Johnsie Cancel Primary Electrophysiologist: none Referring Physician: Dr Vella Raring is a 82 y.o. male with a history of CAD, DM, HTN, HLD, ischemic CM, atrial fibrillation who presents for consultation in the [facility]. He was admitted 10/12 to 10/13 with a NSTEMI. He initially presented to [facility] and was transferred to [facility]. Cath revealed chronically occluded S-RCA, patent L-LAD and S-Dx and severe disease in the body of the S-OM graft which was treated with a DES. Pt was noted to be in AFib with controlled rate. He was started on Eliquis for a CHADS2VASC score of 5. Patient underwent DCCV on 10/20/22. He called Dr Kyla Balzarine office to report an irregular heart beat. ECG today shows frequent PACs. He denies significant snoring or alcohol use.   Today, he denies symptoms of palpitations, chest pain, shortness of breath, orthopnea, PND, lower extremity edema, dizziness, presyncope, syncope, snoring, daytime somnolence, bleeding, or neurologic sequela. The patient is tolerating medications without difficulties and is otherwise without complaint today.    Atrial Fibrillation Risk Factors:  he does not have symptoms or diagnosis of sleep apnea. he does not have a history of rheumatic fever. he does not have a history of alcohol use. The patient does not have a history of early familial atrial fibrillation or other arrhythmias.  he has a BMI of Body mass index is 33.54 kg/m.Marland Kitchen Filed Weights   11/11/22 1017  Weight: 115.3 kg    No family history on file.   Atrial Fibrillation Management history:  Previous antiarrhythmic drugs: none Previous cardioversions: 10/20/22 Previous ablations: none CHADS2VASC score: 5 Anticoagulation history: Eliquis   Past Medical History:  Diagnosis Date   Abnormal liver function    Arthritis    Atrial fibrillation ([place]) 08/12/2022    Eliquis   Coronary Artery Disease    s/p MI in 1997 followed by CABG // NSTEMI 08/2022 >> s/p DES to S-OM [L-LAD patent, S-Dx patent, S-RCA CTO] // Echo 2015: EF 45-50, inf HK // Echo 10/23: EF 55-60, no WMA   Diabetes mellitus without complication (HCC)    GERD (gastroesophageal reflux disease)    Hyperlipemia    Hypertension    MI (myocardial infarction) ([place])    SCC (squamous cell carcinoma), arm, left    SCC (squamous cell carcinoma), arm, 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   Past Surgical History:  Procedure Laterality Date   ANKLE SURGERY  93,03   rt and lt    CARDIOVERSION N/A 10/20/2022   Procedure: CARDIOVERSION;  Surgeon: Josue Hector, MD;  Location: [facility];  Service: Cardiovascular;  Laterality: N/A;   CHOLECYSTECTOMY  10/19/2012   Procedure: LAPAROSCOPIC CHOLECYSTECTOMY WITH INTRAOPERATIVE CHOLANGIOGRAM;  Surgeon: Ralene Ok, MD;  Location: [facility];  Service: General;  Laterality: N/A;   COLONOSCOPY     CORONARY ARTERY BYPASS GRAFT  1997   CORONARY STENT INTERVENTION N/A 08/12/2022   Procedure: CORONARY STENT INTERVENTION;  Surgeon: Burnell Blanks, MD;  Location: [facility];  Service: Cardiovascular;  Laterality: N/A;   ENDOSCOPIC RETROGRADE CHOLANGIOPANCREATOGRAPHY (ERCP) WITH PROPOFOL N/A 07/16/2021   Procedure: ENDOSCOPIC RETROGRADE CHOLANGIOPANCREATOGRAPHY (ERCP) WITH PROPOFOL;  Surgeon: Milus Banister, MD;  Location: [facility];  Service: Endoscopy;  Laterality: N/A;   LEFT HEART CATH AND CORS/GRAFTS ANGIOGRAPHY N/A 08/12/2022   Procedure: LEFT HEART CATH AND CORS/GRAFTS ANGIOGRAPHY;  Surgeon: Burnell Blanks, MD;  Location: [facility];  Service: Cardiovascular;  Laterality: N/A;   REMOVAL OF STONES  07/16/2021   Procedure: REMOVAL OF STONES;  Surgeon: Milus Banister, MD;  Location: [facility];  Service: Endoscopy;;   SKIN CANCER EXCISION  12/2020   Squamous Cell Cancer   SPHINCTEROTOMY  07/16/2021   Procedure:  SPHINCTEROTOMY;  Surgeon: Milus Banister, MD;  Location: [facility];  Service: Endoscopy;;   TIBIA FRACTURE SURGERY      Current Outpatient Medications  Medication Sig Dispense Refill   acetaminophen (TYLENOL) 500 MG tablet Take 500-1,000 mg by mouth every 6 (six) hours as needed (pain.).     apixaban (ELIQUIS) 5 MG TABS tablet TAKE 1 TABLET BY MOUTH TWICE DAILY. 60 tablet 5   atorvastatin (LIPITOR) 20 MG tablet Take 1 tablet (20 mg total) by mouth daily. 90 tablet 3   Blood Glucose Monitoring Suppl (ONETOUCH VERIO) w/Device KIT Use as instructed. Check blood glucose twice daily. E11.42 1 kit 0   clopidogrel (PLAVIX) 75 MG tablet TAKE 1 TABLET BY MOUTH EVERY DAY WITH BREAKFAST 30 tablet 6   Cyanocobalamin 1000 MCG/ML LIQD Place 2,500 mcg under the tongue in the morning. Place 0.5 dropperful (2,500 mcg) sublingually daily     empagliflozin (JARDIANCE) 10 MG TABS tablet Take 10 mg by mouth daily.     gabapentin (NEURONTIN) 300 MG capsule Take 1 capsule (300 mg total) by mouth 2 (two) times daily. 180 capsule 3   glucose blood test strip Use as instructed, Check blood glucose twice daily. 100 each 12   glucose blood test strip      insulin aspart protamine- aspart (NOVOLOG MIX 70/30) (70-30) 100 UNIT/ML injection Inject 0.4-0.6 mLs (40-60 Units total) into the skin See admin instructions. Inject 60 units into the skin before breakfast and 40 units units before supper/evening meal 10 mL 0   Insulin Pen Needle (NOVOFINE) 30G X 8 MM MISC Inject 10 each into the skin as needed. E11.42 100 each 2   Lancets (ONETOUCH ULTRASOFT) lancets Use as instructed, Check blood glucose twice daily. 100 each 12   metoprolol succinate (TOPROL-XL) 25 MG 24 hr tablet TAKE ONE (1) TABLET ONCE DAILY 90 tablet 3   Multiple Vitamins-Minerals (PRESERVISION AREDS PO) Take 1 capsule by mouth 2 (two) times daily.     telmisartan (MICARDIS) 80 MG tablet TAKE ONE (1) TABLET BY MOUTH ONCE DAILY 90 tablet 3   dapagliflozin  propanediol (FARXIGA) 5 MG TABS tablet Take 5 mg by mouth daily. (Patient not taking: Reported on 11/11/2022)     Current Facility-Administered Medications  Medication Dose Route Frequency Provider Last Rate Last Admin   Insulin Pen Needle (NOVOFINE) 10 each  1 packet Subcutaneous PRN Cox, Kirsten, MD        Allergies  Allergen Reactions   Nitroglycerin Other (See Comments)    Blood pressure 'bottomed out'   Statins Other (See Comments)    Elevation of liver enzymes   Tetanus Toxoid Other (See Comments)    Severe flu symptoms fever and chills   Penicillins Rash    Social History   Socioeconomic History   Marital status: Married    Spouse name: Not on file   Number of children: Not on file   Years of education: Not on file   Highest education level: Not on file  Occupational History   Not on file  Tobacco Use   Smoking status: Former    Packs/day: 1.00    Years: 20.00    Total pack years: 20.00  Types: Cigarettes    Quit date: 10/19/1975    Years since quitting: 47.0   Smokeless tobacco: Never   Tobacco comments:    Former smoker 11/11/22  Substance and Sexual Activity   Alcohol use: No   Drug use: No   Sexual activity: Not on file  Other Topics Concern   Not on file  Social History Narrative   Not on file   Social Determinants of Health   Financial Resource Strain: High Risk (09/15/2022)   Overall Financial Resource Strain (CARDIA)    Difficulty of Paying Living Expenses: Hard  Food Insecurity: No Food Insecurity (09/10/2022)   Hunger Vital Sign    Worried About Running Out of Food in the Last Year: Never true    Ran Out of Food in the Last Year: Never true  Transportation Needs: No Transportation Needs (09/15/2022)   PRAPARE - Hydrologist (Medical): No    Lack of Transportation (Non-Medical): No  Physical Activity: Sufficiently Active (09/10/2022)   Exercise Vital Sign    Days of Exercise per Week: 4 days    Minutes of  Exercise per Session: 90 min  Stress: No Stress Concern Present (09/10/2022)   [place]    Feeling of Stress : Not at all  Social Connections: [place] (09/10/2022)   Social Connection and Isolation Panel [NHANES]    Frequency of Communication with Friends and Family: More than three times a week    Frequency of Social Gatherings with Friends and Family: More than three times a week    Attends Religious Services: More than 4 times per year    Active Member of [employer] or Organizations: Yes    Attends Archivist Meetings: 1 to 4 times per year    Marital Status: Married  Human resources officer Violence: Not At Risk (09/10/2022)   Humiliation, Afraid, Rape, and Kick questionnaire    Fear of Current or Ex-Partner: No    Emotionally Abused: No    Physically Abused: No    Sexually Abused: No     ROS- All systems are reviewed and negative except as per the HPI above.  Physical Exam: Vitals:   11/11/22 1017  BP: 104/70  Pulse: 76  Weight: 115.3 kg  Height: '6\' 1"'$  (1.854 m)    GEN- The patient is a well appearing elderly male, alert and oriented x 3 today.   Head- normocephalic, atraumatic Eyes-  Sclera clear, conjunctiva pink Ears- hearing intact Oropharynx- clear Neck- supple  Lungs- Clear to ausculation bilaterally, normal work of breathing Heart- Regular rate and rhythm, occasional ectopic beat, no murmurs, rubs or gallops  GI- soft, NT, ND, + BS Extremities- no clubbing, cyanosis, or edema MS- no significant deformity or atrophy Skin- no rash or lesion Psych- euthymic mood, full affect Neuro- strength and sensation are intact  Wt Readings from Last 3 Encounters:  11/11/22 115.3 kg  10/20/22 116.6 kg  10/12/22 116.6 kg    EKG today demonstrates  SR, 1st degree AV block, frequent PAC Vent. rate 76 BPM PR interval 216 ms QRS duration 96 ms QT/QTcB 370/416 ms  Echo 08/13/22 demonstrated    1. Left ventricular ejection fraction, by estimation, is 55 to 60%. The  left ventricle has normal function. The left ventricle has no regional  wall motion abnormalities. Left ventricular diastolic parameters are  indeterminate.   2. Right ventricular systolic function was not well visualized. The right  ventricular  size is mildly enlarged. There is normal pulmonary artery  systolic pressure.   3. Left atrial size was moderately dilated.   4. Right atrial size was moderately dilated.   5. The mitral valve is normal in structure. Trivial mitral valve  regurgitation.   6. The aortic valve is tricuspid. Aortic valve regurgitation is not  visualized.   7. The inferior vena cava is normal in size with greater than 50%  respiratory variability, suggesting right atrial pressure of 3 mmHg.   Epic records are reviewed at length today  CHA2DS2-VASc Score = 5  The patient's score is based upon: CHF History: 0 HTN History: 1 Diabetes History: 1 Stroke History: 0 Vascular Disease History: 1 Age Score: 2 Gender Score: 0       ASSESSMENT AND PLAN: 1. Persistent Atrial Fibrillation (ICD10:  I48.19) The patient's CHA2DS2-VASc score is 5, indicating a 7.2% annual risk of stroke.   S/p DCCV on 10/20/22 Patient appears to be maintaining SR with PACs. Given his moderate biatrial enlargement he may eventually need AAD. We briefly discussed dofetilide and amiodarone. Would avoid class IC with CAD.  Continue Eliquis 5 mg BID Continue Toprol 25 mg daily  2. Secondary Hypercoagulable State (ICD10:  D68.69) The patient is at significant risk for stroke/thromboembolism based upon his CHA2DS2-VASc Score of 5.  Continue Apixaban (Eliquis).   3. Obesity Body mass index is 33.54 kg/m. Lifestyle modification was discussed at length including regular exercise and weight reduction.  4. CAD/ischemic CM S/p CABG S/p NSTEMI 08/2022 with DES EF recovered On statin and Plavix No anginal  symptoms.  5. HTN Stable, no changes today.   Follow up in the AF clinic in 6 weeks.    [facility] [address] [place], [place] [address] [phone] 11/11/2022 10:34 AM

## 2022-11-11 NOTE — Telephone Encounter (Signed)
Patient's wife called stating Andre Shaffer had a cardioversion last month.  She said he needs a note stating he is cleared to go back to cardiac rehab.  It needs to be sent to [facility] in [place]. She would like a call when this has been done.

## 2022-11-11 NOTE — Telephone Encounter (Signed)
Called patient's wife and informed her that Dr. Johnsie Cancel is fine writing a letter for cardiac rehab. Called and go their number to fax- [phone]. Sent letter and received confirmation.

## 2022-11-15 ENCOUNTER — Telehealth: Payer: Self-pay

## 2022-11-15 DIAGNOSIS — Z79899 Other long term (current) drug therapy: Secondary | ICD-10-CM | POA: Diagnosis not present

## 2022-11-15 DIAGNOSIS — I252 Old myocardial infarction: Secondary | ICD-10-CM | POA: Diagnosis not present

## 2022-11-15 DIAGNOSIS — Z955 Presence of coronary angioplasty implant and graft: Secondary | ICD-10-CM | POA: Diagnosis not present

## 2022-11-15 DIAGNOSIS — I10 Essential (primary) hypertension: Secondary | ICD-10-CM | POA: Diagnosis not present

## 2022-11-15 DIAGNOSIS — Z7901 Long term (current) use of anticoagulants: Secondary | ICD-10-CM | POA: Diagnosis not present

## 2022-11-15 DIAGNOSIS — E785 Hyperlipidemia, unspecified: Secondary | ICD-10-CM | POA: Diagnosis not present

## 2022-11-15 DIAGNOSIS — Z7982 Long term (current) use of aspirin: Secondary | ICD-10-CM | POA: Diagnosis not present

## 2022-11-15 NOTE — Telephone Encounter (Signed)
Inez Catalina stated that she dropped off patient assistance paperwork before christmas and it has not been sent in by our office. Inez Catalina stated that last week in the middle of the week she has left messages for Ovid Curd and Felicity Coyer and has not heard back. Can someone please call this patient or his wife back with more information? Thank you

## 2022-11-16 ENCOUNTER — Telehealth: Payer: Self-pay | Admitting: Cardiovascular Disease

## 2022-11-16 NOTE — Telephone Encounter (Signed)
Andre Shaffer with [name] states yesterday she faxed a 3 page exercise session to the office and she would like a call back to discuss when Dr. Johnsie Cancel has an opportunity to review it.

## 2022-11-17 ENCOUNTER — Telehealth: Payer: Self-pay

## 2022-11-17 NOTE — Telephone Encounter (Cosign Needed)
Andre Shaffer are you able to fax that paper work that was dropped off to [employer]? That would be very helpful.

## 2022-11-17 NOTE — Telephone Encounter (Cosign Needed)
I have his original application, I will refax with updated income Thursday.  Thank you  Pattricia Boss, [place] Pharmacist Assistant [phone]

## 2022-11-17 NOTE — Telephone Encounter (Cosign Needed)
Good morning, HC will be reaching out to patient and wife, he was denied assistance.  Thank you  Pattricia Boss, [place] Pharmacist Assistant [phone]

## 2022-11-17 NOTE — Telephone Encounter (Signed)
Josue Hector, MD  Michaelyn Barter, RN Dont take micardis on days he goes to rehab  Left message with [facility] of recommendations. Called patient with recommendations. Patient verbalized understanding.

## 2022-11-18 ENCOUNTER — Telehealth: Payer: Self-pay

## 2022-11-18 NOTE — Progress Notes (Signed)
  Chronic Care Management Note  11/18/2022 Name: Andre Shaffer MRN: [id number] DOB: Aug 16, 1941  Andre Shaffer is a 82 y.o. year old male who is a primary care patient of Cox, Kirsten, MD and is actively engaged with the Chronic Care Management team. I reached out to Andre Shaffer by phone today to assist with re-scheduling a follow up visit with the RN Case Manager  Follow up plan: Unsuccessful telephone outreach attempt made. A HIPAA compliant phone message was left for the patient providing contact information and requesting a return call.  The care management team will reach out to the patient again over the next 7 days.  If patient returns call to provider office, please advise to call [place]  at [place], [place], [place] [address] Direct Dial: [phone] [email or web address]

## 2022-11-18 NOTE — Progress Notes (Unsigned)
Re-faxed [employer] application correcting income and sending additional income documentation.    Pattricia Boss, [place] Pharmacist Assistant [phone]

## 2022-11-18 NOTE — Progress Notes (Signed)
Care Management & Coordination Services Pharmacy Team  Reason for Encounter: Diabetes  Contacted patient on 11/18/2022 to discuss diabetes disease state.   Recent office visits:  None  Recent consult visits:  11-11-2022 Oliver Barre, [place] (Cardiology). EKG completed.  10-20-2022 Josue Hector, [place] (Cardiology). Cardioversion surgery completed.  Hospital visits:  None in previous 6 months  Medications: Outpatient Encounter Medications as of 11/17/2022  Medication Sig Note   acetaminophen (TYLENOL) 500 MG tablet Take 500-1,000 mg by mouth every 6 (six) hours as needed (pain.).    apixaban (ELIQUIS) 5 MG TABS tablet TAKE 1 TABLET BY MOUTH TWICE DAILY.    atorvastatin (LIPITOR) 20 MG tablet Take 1 tablet (20 mg total) by mouth daily.    Blood Glucose Monitoring Suppl (ONETOUCH VERIO) w/Device KIT Use as instructed. Check blood glucose twice daily. E11.42    clopidogrel (PLAVIX) 75 MG tablet TAKE 1 TABLET BY MOUTH EVERY DAY WITH BREAKFAST    Cyanocobalamin 1000 MCG/ML LIQD Place 2,500 mcg under the tongue in the morning. Place 0.5 dropperful (2,500 mcg) sublingually daily    dapagliflozin propanediol (FARXIGA) 5 MG TABS tablet Take 5 mg by mouth daily. (Patient not taking: Reported on 11/11/2022) 10/20/2022: Not taking at this time   empagliflozin (JARDIANCE) 10 MG TABS tablet Take 10 mg by mouth daily.    gabapentin (NEURONTIN) 300 MG capsule Take 1 capsule (300 mg total) by mouth 2 (two) times daily.    glucose blood test strip Use as instructed, Check blood glucose twice daily.    glucose blood test strip     insulin aspart protamine- aspart (NOVOLOG MIX 70/30) (70-30) 100 UNIT/ML injection Inject 0.4-0.6 mLs (40-60 Units total) into the skin See admin instructions. Inject 60 units into the skin before breakfast and 40 units units before supper/evening meal    Insulin Pen Needle (NOVOFINE) 30G X 8 MM MISC Inject 10 each into the skin as needed. E11.42    Lancets (ONETOUCH ULTRASOFT)  lancets Use as instructed, Check blood glucose twice daily.    metoprolol succinate (TOPROL-XL) 25 MG 24 hr tablet TAKE ONE (1) TABLET ONCE DAILY    Multiple Vitamins-Minerals (PRESERVISION AREDS PO) Take 1 capsule by mouth 2 (two) times daily.    telmisartan (MICARDIS) 80 MG tablet TAKE ONE (1) TABLET BY MOUTH ONCE DAILY    Facility-Administered Encounter Medications as of 11/17/2022  Medication   Insulin Pen Needle (NOVOFINE) 10 each    Recent Relevant Labs: Lab Results  Component Value Date/Time   HGBA1C 6.8 (H) 08/13/2022 03:25 AM   HGBA1C 7.5 (H) 05/18/2022 10:45 AM   MICROALBUR 80 09/10/2021 11:44 AM   MICROALBUR 150 04/16/2021 02:11 PM    Kidney Function Lab Results  Component Value Date/Time   CREATININE 1.53 (H) 10/18/2022 11:15 AM   CREATININE 1.20 08/13/2022 03:25 AM   GFRNONAA >60 08/13/2022 03:25 AM   GFRAA 75 10/06/2020 01:46 PM    Current antihyperglycemic regimen:  Inslin Aspart 60 units am 40 units PM (Patient's wife said patient tried the recommended decreased dose of 55 in AM and 40 PM for a week then went back to regular dose) Jardiance 10 mg daily (Samples) until farxiga is approved    Patient verbally confirms he is taking the above medications as directed. Yes  What diet changes have been made to improve diabetes control?  What recent interventions/DTPs have been made to improve glycemic control:  Educated on A1c and blood sugar goals; -Counseled to check feet daily and get  yearly eye exams  Have there been any recent hospitalizations or ED visits since last visit with PharmD? No  Patient denies hypoglycemic symptoms  Patient denies hyperglycemic symptoms  How often are you checking your blood sugar? once daily  What are your blood sugars ranging?  Fasting: 01-18 163, 01-17 152, 01-16 155, 01-15 150, 01-14 153 Before meals: None After meals: None Bedtime: None  During the week, how often does your blood glucose drop below 70? Patient's wife  stated it  Are you checking your feet daily/regularly? Yes  NOTES: Informed patient's wife that new income documentation for farxiga was faxed today   Adherence Review: Is the patient currently on a STATIN medication? Yes Is the patient currently on ACE/ARB medication? Yes Does the patient have >5 day gap between last estimated fill dates? No  Star Rating Drugs:  Telmisartan 80 mg- Last filled 11-02-2022 90 DS. Previous filled 08-02-2022 90 DS. Atorvastatin 20 mg- Last filled 11-08-2022 90 DS. Previous filled 08-02-2022 90 DS.   Care Gaps: Annual wellness visit in last year? Yes Last eye exam / retinopathy screening:12-2021 Last diabetic foot exam: 08-30-2022   [facility]

## 2022-11-22 ENCOUNTER — Encounter: Payer: Self-pay | Admitting: Physician Assistant

## 2022-11-25 NOTE — Progress Notes (Signed)
  Chronic Care Management Note  11/25/2022 Name: [name] MRN: [id number] DOB: [birth year]  Andre Shaffer is a 82 y.o. year old male who is a primary care patient of Cox, Kirsten, MD and is actively engaged with the Chronic Care Management team. I reached out to Andre Shaffer by phone today to assist with re-scheduling a follow up visit with the RN Case Manager  Follow up plan: Unsuccessful telephone outreach attempt made. A HIPAA compliant phone message was left for the patient providing contact information and requesting a return call.  The care management team will reach out to the patient again over the next 7 days.  If patient returns call to provider office, please advise to call [place]  at [place], [place], [place] [address] Direct Dial: [phone] [email or web address]

## 2022-11-26 NOTE — Telephone Encounter (Signed)
Patient brought in his form 1040 for [place] to fax to [employer] -- faxed 11/26/22

## 2022-11-29 NOTE — Progress Notes (Signed)
  Chronic Care Management   Note  11/29/2022 Name: Andre Shaffer MRN: [id number] DOB: 01-Jul-1941  Andre Shaffer is a 82 y.o. year old male who is a primary care patient of Cox, Kirsten, MD. I reached out to Andre Shaffer by phone today in response to a referral sent by Andre Shaffer PCP.  The third contact attempt was unsuccessful.   Follow up plan: Unable to reach patient after 3 attempts. No further outreach attempts will be made pending additional provider engagement, patient request, or new provider order.   Noreene Larsson, [place],  [address] Direct Dial: [phone] [email or web address]

## 2022-11-29 NOTE — Telephone Encounter (Cosign Needed)
Thank you!   Pattricia Boss, [place] Pharmacist Assistant [phone]

## 2022-12-01 NOTE — Assessment & Plan Note (Addendum)
Hold metoprolol.  Hold telmisartan SBP > 160 or DBP > 90, give telmisartan 80 mg 1/2 daily. Continue plavix 75 mg daily.

## 2022-12-01 NOTE — Progress Notes (Unsigned)
Subjective:  Patient ID: Andre Shaffer, male    DOB: [birth year]  Age: 82 y.o. MRN: [id number]  Chief Complaint  Patient presents with   Diabetes   Hyperlipidemia   Hypertension    HPI: Diabetes:  Complications: neuropathy.  Glucose checking: Checks sugars fasting daily Glucose logs: 106-209. Hypoglycemia: occasional. Most recent A1C: 6.8 Current medications:  Novolog insulin injection 70/30 60 U in am and 40 U before supper, Gabapentin 300 mg 1 capsule twice daily, farxiga 5 mg daily, Farxiga 5 mg daily.  Last Eye Exam: Patient sees Dr. Alanda Slim in [place].  Foot checks: daily.    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 Dr. Jimmye Norman to do it when able to come off of blood thinners. Dr. Johnsie Cancel was reluctant to take off blood thinner due tor recent stent and cardioversion.  Hyperlipidemia: Current medications: Atorvastatin 20 mg daily.    Hypertension/CAD: Current medications: Metoprolol xl 25 mg take 1/2 tablet daily (wife has been giving patient 1/2 tablet due to his bp dropping), Telmisartan 80 mg take 1 tablet daily. 133/83 this morning.  BP has been dropping 70/46 - 108/55. Patient feels horrible. Pulse 47-63. Cardiopulmonary rehab lowers his bp.  Wife states cardiology advised to not take telmisartan on the days he has rehab (4 days a week)  Diet: healthy.  Exercise: cardiopulmonary rehab.      12/02/2022   10:49 AM 09/10/2022   11:36 AM 05/18/2022    9:30 AM 09/10/2021   10:37 AM 05/12/2021    8:33 AM  Depression screen PHQ 2/9  Decreased Interest 0 0 0 0 0  Down, Depressed, Hopeless 0 0 0 0 0  PHQ - 2 Score 0 0 0 0 0         05/18/2022    9:29 AM 08/12/2022    7:46 PM 08/13/2022    7:15 AM 09/10/2022   11:39 AM 12/02/2022   10:49 AM  Fall Risk  Falls in the past year? 0   0 0  Was there an injury with Fall? 0   0 0  Fall Risk Category Calculator 0   0 0  Fall Risk Category (Retired) Low   Low   (RETIRED) Patient Fall Risk Level Low fall risk  Moderate fall risk Moderate fall risk Low fall risk   Patient at Risk for Falls Due to Impaired balance/gait   No Fall Risks No Fall Risks  Fall risk Follow up Falls evaluation completed   Falls evaluation completed;Education provided Falls evaluation completed      Review of Systems  Constitutional:  Negative for chills, diaphoresis, fatigue and fever.  HENT:  Negative for congestion, ear pain and sore throat.   Respiratory:  Negative for cough and shortness of breath.   Cardiovascular:  Negative for chest pain and leg swelling.  Gastrointestinal:  Negative for abdominal pain, constipation, diarrhea, nausea and vomiting.  Genitourinary:  Negative for dysuria and urgency.  Musculoskeletal:  Negative for arthralgias and myalgias.  Neurological:  Negative for dizziness and headaches.  Psychiatric/Behavioral:  Negative for dysphoric mood.     Current Outpatient Medications on File Prior to Visit  Medication Sig Dispense Refill   dapagliflozin propanediol (FARXIGA) 5 MG TABS tablet Take 5 mg by mouth daily.     acetaminophen (TYLENOL) 500 MG tablet Take 500-1,000 mg by mouth every 6 (six) hours as needed (pain.).     apixaban (ELIQUIS) 5 MG TABS tablet TAKE 1 TABLET BY MOUTH TWICE DAILY. 60 tablet  5   atorvastatin (LIPITOR) 20 MG tablet Take 1 tablet (20 mg total) by mouth daily. 90 tablet 3   Blood Glucose Monitoring Suppl (ONETOUCH VERIO) w/Device KIT Use as instructed. Check blood glucose twice daily. E11.42 1 kit 0   clopidogrel (PLAVIX) 75 MG tablet TAKE 1 TABLET BY MOUTH EVERY DAY WITH BREAKFAST 30 tablet 6   Cyanocobalamin 1000 MCG/ML LIQD Place 2,500 mcg under the tongue in the morning. Place 0.5 dropperful (2,500 mcg) sublingually daily     gabapentin (NEURONTIN) 300 MG capsule Take 1 capsule (300 mg total) by mouth 2 (two) times daily. 180 capsule 3   glucose blood test strip      insulin aspart protamine- aspart (NOVOLOG MIX 70/30) (70-30) 100 UNIT/ML injection Inject 0.4-0.6 mLs  (40-60 Units total) into the skin See admin instructions. Inject 60 units into the skin before breakfast and 40 units units before supper/evening meal 10 mL 0   Insulin Pen Needle (NOVOFINE) 30G X 8 MM MISC Inject 10 each into the skin as needed. E11.42 100 each 2   Lancets (ONETOUCH ULTRASOFT) lancets Use as instructed, Check blood glucose twice daily. 100 each 12   metoprolol succinate (TOPROL-XL) 25 MG 24 hr tablet TAKE ONE (1) TABLET ONCE DAILY (Patient taking differently: 12.5 mg daily. TAKE ONE (1) TABLET ONCE DAILY) 90 tablet 3   Multiple Vitamins-Minerals (PRESERVISION AREDS PO) Take 1 capsule by mouth 2 (two) times daily.     telmisartan (MICARDIS) 80 MG tablet TAKE ONE (1) TABLET BY MOUTH ONCE DAILY 90 tablet 3   Current Facility-Administered Medications on File Prior to Visit  Medication Dose Route Frequency Provider Last Rate Last Admin   Insulin Pen Needle (NOVOFINE) 10 each  1 packet Subcutaneous PRN Catelynn Sparger, MD       Past Medical History:  Diagnosis Date   Abnormal liver function    Arthritis    Atrial fibrillation ([place]) 08/12/2022   Eliquis   Coronary Artery Disease    s/p MI in 1997 followed by CABG // NSTEMI 08/2022 >> s/p DES to S-OM [L-LAD patent, S-Dx patent, S-RCA CTO] // Echo 2015: EF 45-50, inf HK // Echo 10/23: EF 55-60, no WMA   Diabetes mellitus without complication (HCC)    GERD (gastroesophageal reflux disease)    Hyperlipemia    Hypertension    MI (myocardial infarction) ([place])    SCC (squamous cell carcinoma), arm, left    SCC (squamous cell carcinoma), arm, 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   Past Surgical History:  Procedure Laterality Date   ANKLE SURGERY  93,03   rt and lt    CARDIOVERSION N/A 10/20/2022   Procedure: CARDIOVERSION;  Surgeon: Josue Hector, MD;  Location: [facility];  Service: Cardiovascular;  Laterality: N/A;   CHOLECYSTECTOMY  10/19/2012   Procedure: LAPAROSCOPIC CHOLECYSTECTOMY WITH INTRAOPERATIVE CHOLANGIOGRAM;  Surgeon: Ralene Ok, MD;   Location: [facility];  Service: General;  Laterality: N/A;   COLONOSCOPY     CORONARY ARTERY BYPASS GRAFT  1997   CORONARY STENT INTERVENTION N/A 08/12/2022   Procedure: CORONARY STENT INTERVENTION;  Surgeon: Burnell Blanks, MD;  Location: [facility];  Service: Cardiovascular;  Laterality: N/A;   ENDOSCOPIC RETROGRADE CHOLANGIOPANCREATOGRAPHY (ERCP) WITH PROPOFOL N/A 07/16/2021   Procedure: ENDOSCOPIC RETROGRADE CHOLANGIOPANCREATOGRAPHY (ERCP) WITH PROPOFOL;  Surgeon: Milus Banister, MD;  Location: [facility];  Service: Endoscopy;  Laterality: N/A;   LEFT HEART CATH AND CORS/GRAFTS ANGIOGRAPHY N/A 08/12/2022   Procedure: LEFT HEART CATH AND CORS/GRAFTS ANGIOGRAPHY;  Surgeon: Burnell Blanks, MD;  Location: [facility];  Service: Cardiovascular;  Laterality: N/A;   REMOVAL OF STONES  07/16/2021   Procedure: REMOVAL OF STONES;  Surgeon: Milus Banister, MD;  Location: [facility];  Service: Endoscopy;;   SKIN CANCER EXCISION  12/2020   Squamous Cell Cancer   SPHINCTEROTOMY  07/16/2021   Procedure: SPHINCTEROTOMY;  Surgeon: Milus Banister, MD;  Location: [facility];  Service: Endoscopy;;   TIBIA FRACTURE SURGERY      History reviewed. No pertinent family history. Social History   Socioeconomic History   Marital status: Married    Spouse name: Not on file   Number of children: Not on file   Years of education: Not on file   Highest education level: Not on file  Occupational History   Not on file  Tobacco Use   Smoking status: Former    Packs/day: 1.00    Years: 20.00    Total pack years: 20.00    Types: Cigarettes    Quit date: 10/19/1975    Years since quitting: 47.1   Smokeless tobacco: Never   Tobacco comments:    Former smoker 11/11/22  Substance and Sexual Activity   Alcohol use: No   Drug use: No   Sexual activity: Not on file  Other Topics Concern   Not on file  Social History Narrative   Not on file   Social  Determinants of Health   Financial Resource Strain: High Risk (09/15/2022)   Overall Financial Resource Strain (CARDIA)    Difficulty of Paying Living Expenses: Hard  Food Insecurity: No Food Insecurity (09/10/2022)   Hunger Vital Sign    Worried About Running Out of Food in the Last Year: Never true    [place] in the Last Year: Never true  Transportation Needs: No Transportation Needs (09/15/2022)   PRAPARE - Hydrologist (Medical): No    Lack of Transportation (Non-Medical): No  Physical Activity: Sufficiently Active (09/10/2022)   Exercise Vital Sign    Days of Exercise per Week: 4 days    Minutes of Exercise per Session: 90 min  Stress: No Stress Concern Present (09/10/2022)   [place]    Feeling of Stress : Not at all  Social Connections: [place] (09/10/2022)   Social Connection and Isolation Panel [NHANES]    Frequency of Communication with Friends and Family: More than three times a week    Frequency of Social Gatherings with Friends and Family: More than three times a week    Attends Religious Services: More than 4 times per year    Active Member of [employer] or Organizations: Yes    Attends Archivist Meetings: 1 to 4 times per year    Marital Status: Married    Objective:  BP 132/74   Pulse 79   Temp (!) 97.1 F (36.2 C)   Ht '6\' 2"'$  (1.88 m)   Wt 252 lb (114.3 kg)   SpO2 99%   BMI 32.35 kg/m      12/02/2022   10:42 AM 11/11/2022   10:17 AM 10/20/2022    9:50 AM  BP/Weight  Systolic BP 027 253 664  Diastolic BP 74 70 68  Wt. (Lbs) 252 254.2   BMI 32.35 kg/m2 33.54 kg/m2     Physical Exam Constitutional:      Appearance: Normal appearance.  Cardiovascular:     Rate  and Rhythm: Normal rate and regular rhythm.     Pulses: Normal pulses.     Heart sounds: Normal heart sounds.  Pulmonary:     Effort: Pulmonary effort is normal.      Breath sounds: Normal breath sounds.  Abdominal:     General: Bowel sounds are normal.     Palpations: Abdomen is soft. There is no mass.     Tenderness: There is no abdominal tenderness.  Neurological:     Mental Status: He is alert.  Psychiatric:        Mood and Affect: Mood normal.        Behavior: Behavior normal.        Thought Content: Thought content normal.     Diabetic Foot Exam - Simple   Simple Foot Form  12/02/2022  4:02 PM  Visual Inspection No deformities, no ulcerations, no other skin breakdown bilaterally: Yes Sensation Testing See comments: Yes Pulse Check Posterior Tibialis and Dorsalis pulse intact bilaterally: Yes Comments Decreased sensation BL feet.       Lab Results  Component Value Date   WBC 6.0 10/18/2022   HGB 16.0 10/18/2022   HCT 48.3 10/18/2022   PLT 165 10/18/2022   GLUCOSE 177 (H) 10/18/2022   CHOL 91 (L) 05/18/2022   TRIG 63 05/18/2022   HDL 31 (L) 05/18/2022   LDLCALC 46 05/18/2022   ALT 10 05/18/2022   AST 16 05/18/2022   NA 144 10/18/2022   K 5.1 10/18/2022   CL 106 10/18/2022   CREATININE 1.53 (H) 10/18/2022   BUN 18 10/18/2022   CO2 19 (L) 10/18/2022   TSH 4.100 05/18/2022   HGBA1C 6.8 (H) 08/13/2022   MICROALBUR 80 09/10/2021      Assessment & Plan:    Type 2 diabetes mellitus with hyperglycemia, with long-term current use of insulin (HCC) Assessment & Plan: Control: not at goal Recommend check sugars fasting daily. Recommend check feet daily. Recommend annual eye exams. Medicines: Continue Novolog insulin injection 70/30 60 U in am and 40 U before supper, Increase farxiga 10 mg daily.  Continue to work on eating a healthy diet and exercise.    Atrial fibrillation, unspecified type [facility]) Assessment & Plan: Continue eliquis. Discontinue  metoprolol XL.    Mixed hyperlipidemia Assessment & Plan: Well controlled.  No changes to medicines. Atorvastatin 20 mg daily. Continue to work on eating a healthy diet and  exercise.  Return for labs.    Hypertensive heart disease without congestive heart failure Assessment & Plan: Hold metoprolol.  Hold telmisartan SBP > 160 or DBP > 90, give telmisartan 80 mg 1/2 daily. Continue plavix 75 mg daily.    B12 deficiency Assessment & Plan: Continue b12 2500 mg SL daily.    Class 1 obesity due to excess calories with serious comorbidity and body mass index (BMI) of 33.0 to 33.9 in adult Assessment & Plan: Encouraged to continue exercising at cardiopulmonary rehab   Diabetic polyneuropathy associated with type 2 diabetes mellitus ([place]) Assessment & Plan: Control: not at goal Recommend check sugars fasting daily. Recommend check feet daily. Recommend annual eye exams. Medicines: Continue Novolog insulin injection 70/30 60 U in am and 40 U before supper, Increase farxiga 10 mg daily.  Continue to work on eating a healthy diet and exercise.  Return for labs.       No orders of the defined types were placed in this encounter.   Orders Placed This Encounter  Procedures   [facility] DIABETES EYE  EXAM     Follow-up: No follow-ups on file.  An After Visit Summary was printed and given to the patient.  I,Erving Sassano,acting as a Education administrator for Rochel Brome, MD.,have documented all relevant documentation on the behalf of Rochel Brome, MD,as directed by  Rochel Brome, MD while in the presence of Rochel Brome, MD.   Rochel Brome, MD [place] [phone]

## 2022-12-01 NOTE — Assessment & Plan Note (Addendum)
Increase farxiga 10 mg daily.  Labs drawn today.

## 2022-12-01 NOTE — Assessment & Plan Note (Addendum)
Continue eliquis. Discontinue  metoprolol XL.

## 2022-12-02 ENCOUNTER — Ambulatory Visit (INDEPENDENT_AMBULATORY_CARE_PROVIDER_SITE_OTHER): Payer: PPO | Admitting: Family Medicine

## 2022-12-02 ENCOUNTER — Other Ambulatory Visit: Payer: Self-pay | Admitting: Family Medicine

## 2022-12-02 ENCOUNTER — Encounter: Payer: Self-pay | Admitting: Family Medicine

## 2022-12-02 VITALS — BP 132/74 | HR 79 | Temp 97.1°F | Ht 74.0 in | Wt 252.0 lb

## 2022-12-02 DIAGNOSIS — I119 Hypertensive heart disease without heart failure: Secondary | ICD-10-CM | POA: Diagnosis not present

## 2022-12-02 DIAGNOSIS — E6609 Other obesity due to excess calories: Secondary | ICD-10-CM | POA: Diagnosis not present

## 2022-12-02 DIAGNOSIS — E538 Deficiency of other specified B group vitamins: Secondary | ICD-10-CM | POA: Diagnosis not present

## 2022-12-02 DIAGNOSIS — E1142 Type 2 diabetes mellitus with diabetic polyneuropathy: Secondary | ICD-10-CM | POA: Diagnosis not present

## 2022-12-02 DIAGNOSIS — E1165 Type 2 diabetes mellitus with hyperglycemia: Secondary | ICD-10-CM | POA: Diagnosis not present

## 2022-12-02 DIAGNOSIS — E782 Mixed hyperlipidemia: Secondary | ICD-10-CM

## 2022-12-02 DIAGNOSIS — I4891 Unspecified atrial fibrillation: Secondary | ICD-10-CM | POA: Diagnosis not present

## 2022-12-02 DIAGNOSIS — Z6833 Body mass index (BMI) 33.0-33.9, adult: Secondary | ICD-10-CM | POA: Diagnosis not present

## 2022-12-02 DIAGNOSIS — Z794 Long term (current) use of insulin: Secondary | ICD-10-CM | POA: Diagnosis not present

## 2022-12-02 NOTE — Patient Instructions (Addendum)
Hypertension: Hold metoprolol.  Hold telmisartan SBP > 160 or DBP > 90, give telmisartan 80 mg 1/2 daily.   Diabetes Increase farxiga 10 mg daily.

## 2022-12-03 DIAGNOSIS — I252 Old myocardial infarction: Secondary | ICD-10-CM | POA: Diagnosis not present

## 2022-12-03 DIAGNOSIS — Z7982 Long term (current) use of aspirin: Secondary | ICD-10-CM | POA: Diagnosis not present

## 2022-12-03 DIAGNOSIS — E785 Hyperlipidemia, unspecified: Secondary | ICD-10-CM | POA: Diagnosis not present

## 2022-12-03 DIAGNOSIS — I214 Non-ST elevation (NSTEMI) myocardial infarction: Secondary | ICD-10-CM | POA: Diagnosis not present

## 2022-12-03 DIAGNOSIS — Z7901 Long term (current) use of anticoagulants: Secondary | ICD-10-CM | POA: Diagnosis not present

## 2022-12-03 DIAGNOSIS — Z955 Presence of coronary angioplasty implant and graft: Secondary | ICD-10-CM | POA: Diagnosis not present

## 2022-12-03 DIAGNOSIS — I10 Essential (primary) hypertension: Secondary | ICD-10-CM | POA: Diagnosis not present

## 2022-12-03 DIAGNOSIS — Z79899 Other long term (current) drug therapy: Secondary | ICD-10-CM | POA: Diagnosis not present

## 2022-12-04 NOTE — Assessment & Plan Note (Signed)
Well controlled.  No changes to medicines. Atorvastatin 20 mg daily. Continue to work on eating a healthy diet and exercise.  Return for labs.

## 2022-12-05 NOTE — Assessment & Plan Note (Signed)
Continue b12 2500 mg SL daily.

## 2022-12-05 NOTE — Assessment & Plan Note (Signed)
Control: not at goal Recommend check sugars fasting daily. Recommend check feet daily. Recommend annual eye exams. Medicines: Continue Novolog insulin injection 70/30 60 U in am and 40 U before supper, Increase farxiga 10 mg daily.  Continue to work on eating a healthy diet and exercise.  Return for labs.

## 2022-12-05 NOTE — Assessment & Plan Note (Signed)
Encouraged to continue exercising at cardiopulmonary rehab

## 2022-12-08 ENCOUNTER — Telehealth: Payer: Self-pay

## 2022-12-08 NOTE — Progress Notes (Cosign Needed)
AZ&ME Notification:  Patient approved for 2024 Enrollment with [employer] Patient Assistance Program for Farxiga, enrollment will end on November 01, 2023.   Tamala Melvin, CMA Clinical Pharmacist Assistant [phone]  

## 2022-12-14 ENCOUNTER — Other Ambulatory Visit: Payer: Self-pay

## 2022-12-14 DIAGNOSIS — Z794 Long term (current) use of insulin: Secondary | ICD-10-CM | POA: Diagnosis not present

## 2022-12-14 DIAGNOSIS — E1165 Type 2 diabetes mellitus with hyperglycemia: Secondary | ICD-10-CM | POA: Diagnosis not present

## 2022-12-14 DIAGNOSIS — E782 Mixed hyperlipidemia: Secondary | ICD-10-CM | POA: Diagnosis not present

## 2022-12-14 DIAGNOSIS — I119 Hypertensive heart disease without heart failure: Secondary | ICD-10-CM | POA: Diagnosis not present

## 2022-12-14 MED ORDER — DAPAGLIFLOZIN PROPANEDIOL 5 MG PO TABS: 10.0000 mg | ORAL_TABLET | Freq: Every day | ORAL | 3 refills | Status: DC

## 2022-12-14 MED ORDER — DAPAGLIFLOZIN PROPANEDIOL 10 MG PO TABS: 10.0000 mg | ORAL_TABLET | Freq: Every day | ORAL | 3 refills | Status: DC

## 2022-12-15 LAB — CBC WITH DIFFERENTIAL/PLATELET
Basophils Absolute: 0.1 10*3/uL (ref 0.0–0.2)
Basos: 1 %
EOS (ABSOLUTE): 0.3 10*3/uL (ref 0.0–0.4)
Eos: 4 %
Hematocrit: 48.4 % (ref 37.5–51.0)
Hemoglobin: 16.3 g/dL (ref 13.0–17.7)
Immature Grans (Abs): 0 10*3/uL (ref 0.0–0.1)
Immature Granulocytes: 0 %
Lymphocytes Absolute: 1.7 10*3/uL (ref 0.7–3.1)
Lymphs: 22 %
MCH: 32.3 pg (ref 26.6–33.0)
MCHC: 33.7 g/dL (ref 31.5–35.7)
MCV: 96 fL (ref 79–97)
Monocytes Absolute: 0.5 10*3/uL (ref 0.1–0.9)
Monocytes: 7 %
Neutrophils Absolute: 5 10*3/uL (ref 1.4–7.0)
Neutrophils: 66 %
Platelets: 164 10*3/uL (ref 150–450)
RBC: 5.05 x10E6/uL (ref 4.14–5.80)
RDW: 14.2 % (ref 11.6–15.4)
WBC: 7.6 10*3/uL (ref 3.4–10.8)

## 2022-12-15 LAB — COMPREHENSIVE METABOLIC PANEL
ALT: 18 IU/L (ref 0–44)
AST: 21 IU/L (ref 0–40)
Albumin/Globulin Ratio: 1.6 (ref 1.2–2.2)
Albumin: 4.3 g/dL (ref 3.7–4.7)
Alkaline Phosphatase: 128 IU/L — ABNORMAL HIGH (ref 44–121)
BUN/Creatinine Ratio: 14 (ref 10–24)
BUN: 18 mg/dL (ref 8–27)
Bilirubin Total: 0.6 mg/dL (ref 0.0–1.2)
CO2: 19 mmol/L — ABNORMAL LOW (ref 20–29)
Calcium: 9.9 mg/dL (ref 8.6–10.2)
Chloride: 102 mmol/L (ref 96–106)
Creatinine, Ser: 1.33 mg/dL — ABNORMAL HIGH (ref 0.76–1.27)
Globulin, Total: 2.7 g/dL (ref 1.5–4.5)
Glucose: 184 mg/dL — ABNORMAL HIGH (ref 70–99)
Potassium: 4.6 mmol/L (ref 3.5–5.2)
Sodium: 140 mmol/L (ref 134–144)
Total Protein: 7 g/dL (ref 6.0–8.5)
eGFR: 53 mL/min/{1.73_m2} — ABNORMAL LOW (ref >59)

## 2022-12-15 LAB — CARDIOVASCULAR RISK ASSESSMENT

## 2022-12-15 LAB — LIPID PANEL
Chol/HDL Ratio: 3.5 ratio (ref 0.0–5.0)
Cholesterol, Total: 131 mg/dL (ref 100–199)
HDL: 37 mg/dL — ABNORMAL LOW (ref >39)
LDL Chol Calc (NIH): 69 mg/dL (ref 0–99)
Triglycerides: 144 mg/dL (ref 0–149)
VLDL Cholesterol Cal: 25 mg/dL (ref 5–40)

## 2022-12-15 LAB — HEMOGLOBIN A1C
Est. average glucose Bld gHb Est-mCnc: 189 mg/dL
Hgb A1c MFr Bld: 8.2 % — ABNORMAL HIGH (ref 4.8–5.6)

## 2022-12-15 NOTE — Progress Notes (Signed)
Blood count normal.  Liver function normal.  Kidney function abnormal, but improving.  Cholesterol: Fairly good. With CORONARY ARTERY DISEASE. Newest recommendation is to get LDL less than 55.Marland Kitchen Recommend increase lipitor to 40 mg daily  HBA1C: 8.2.much worsened. Continue current insulin. I increased farxiga to 10 mg daily. Using samples. Awaiting PAP.  Please ask how bps doing since we held metoprolol and telmisartan. Instructions were if SBP > 160 or DBP > 90, take  telmisartan 80 mg 1/2 daily.

## 2022-12-17 ENCOUNTER — Other Ambulatory Visit: Payer: Self-pay

## 2022-12-17 MED ORDER — EZETIMIBE 10 MG PO TABS: 10.0000 mg | ORAL_TABLET | Freq: Every day | ORAL | 0 refills | Status: DC

## 2022-12-17 NOTE — Telephone Encounter (Signed)
Dr. Tobie Poet advised that we add zetia since he does not want to increase the lipitor.  Zetia prescription sent to [facility].

## 2022-12-23 ENCOUNTER — Encounter (HOSPITAL_COMMUNITY): Payer: Self-pay | Admitting: Physician Assistant

## 2022-12-23 ENCOUNTER — Ambulatory Visit (HOSPITAL_COMMUNITY)
Admission: RE | Admit: 2022-12-23 | Discharge: 2022-12-23 | Disposition: A | Discharge status: Home / Self Care | Payer: PPO | Source: Ambulatory Visit | Attending: Physician Assistant | Admitting: Physician Assistant

## 2022-12-23 VITALS — BP 110/72 | HR 83 | Ht 74.0 in | Wt 251.4 lb

## 2022-12-23 DIAGNOSIS — I491 Atrial premature depolarization: Secondary | ICD-10-CM | POA: Diagnosis not present

## 2022-12-23 DIAGNOSIS — I4819 Other persistent atrial fibrillation: Secondary | ICD-10-CM | POA: Insufficient documentation

## 2022-12-23 DIAGNOSIS — D6869 Other thrombophilia: Secondary | ICD-10-CM

## 2022-12-23 NOTE — Progress Notes (Signed)
Primary Care Physician: Rochel Brome, MD Primary Cardiologist: Dr Johnsie Cancel Primary Electrophysiologist: none Referring Physician: Dr Vella Raring is a 82 y.o. male with a history of CAD, DM, HTN, HLD, ischemic CM, atrial fibrillation who presents for follow up in the [facility]. He was admitted 10/12 to 10/13 with a NSTEMI. He initially presented to [facility] and was transferred to [facility]. Cath revealed chronically occluded S-RCA, patent L-LAD and S-Dx and severe disease in the body of the S-OM graft which was treated with a DES. Pt was noted to be in AFib with controlled rate. He was started on Eliquis for a CHADS2VASC score of 5. Patient underwent DCCV on 10/20/22. He called Dr Kyla Balzarine office to report an irregular heart beat. ECG in the AF clinic 11/11/22 showed SR with PACs. He denies significant snoring or alcohol use.   On follow up today, patient reports that he feels well today. His ARB and BB were discontinued due to symptomatic orthostatic hypotension at cardiac rehab. He feels much more energetic. No bleeding issues on anticoagulation. 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   Today, he denies symptoms of palpitations, chest pain, shortness of breath, orthopnea, PND, lower extremity edema, dizziness, presyncope, syncope, snoring, daytime somnolence, bleeding, or neurologic sequela. The patient is tolerating medications without difficulties and is otherwise without complaint today.    Atrial Fibrillation Risk Factors:  he does not have symptoms or diagnosis of sleep apnea. he does not have a history of rheumatic fever. he does not have a history of alcohol use. The patient does not have a history of early familial atrial fibrillation or other arrhythmias.  he has a BMI of Body mass index is 32.28 kg/m.Marland Kitchen Filed Weights   12/23/22 1018  Weight: 114 kg    No family history on file.   Atrial  Fibrillation Management history:  Previous antiarrhythmic drugs: none Previous cardioversions: 10/20/22 Previous ablations: none CHADS2VASC score: 5 Anticoagulation history: Eliquis   Past Medical History:  Diagnosis Date   Abnormal liver function    Arthritis    Atrial fibrillation ([place]) 08/12/2022   Eliquis   Coronary Artery Disease    s/p MI in 1997 followed by CABG // NSTEMI 08/2022 >> s/p DES to S-OM [L-LAD patent, S-Dx patent, S-RCA CTO] // Echo 2015: EF 45-50, inf HK // Echo 10/23: EF 55-60, no WMA   Diabetes mellitus without complication (HCC)    GERD (gastroesophageal reflux disease)    Hyperlipemia    Hypertension    MI (myocardial infarction) ([place])    SCC (squamous cell carcinoma), arm, left    SCC (squamous cell carcinoma), arm, 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   Past Surgical History:  Procedure Laterality Date   ANKLE SURGERY  93,03   rt and lt    CARDIOVERSION N/A 10/20/2022   Procedure: CARDIOVERSION;  Surgeon: Josue Hector, MD;  Location: [facility];  Service: Cardiovascular;  Laterality: N/A;   CHOLECYSTECTOMY  10/19/2012   Procedure: LAPAROSCOPIC CHOLECYSTECTOMY WITH INTRAOPERATIVE CHOLANGIOGRAM;  Surgeon: Ralene Ok, MD;  Location: [facility];  Service: General;  Laterality: N/A;   COLONOSCOPY     CORONARY ARTERY BYPASS GRAFT  1997   CORONARY STENT INTERVENTION N/A 08/12/2022   Procedure: CORONARY STENT INTERVENTION;  Surgeon: Burnell Blanks, MD;  Location: [facility];  Service: Cardiovascular;  Laterality: N/A;   ENDOSCOPIC RETROGRADE CHOLANGIOPANCREATOGRAPHY (ERCP) WITH PROPOFOL N/A 07/16/2021  Procedure: ENDOSCOPIC RETROGRADE CHOLANGIOPANCREATOGRAPHY (ERCP) WITH PROPOFOL;  Surgeon: Milus Banister, MD;  Location: [facility];  Service: Endoscopy;  Laterality: N/A;   LEFT HEART CATH AND CORS/GRAFTS ANGIOGRAPHY N/A 08/12/2022   Procedure: LEFT HEART CATH AND CORS/GRAFTS ANGIOGRAPHY;  Surgeon: Burnell Blanks, MD;  Location:  [facility];  Service: Cardiovascular;  Laterality: N/A;   REMOVAL OF STONES  07/16/2021   Procedure: REMOVAL OF STONES;  Surgeon: Milus Banister, MD;  Location: [facility];  Service: Endoscopy;;   SKIN CANCER EXCISION  12/2020   Squamous Cell Cancer   SPHINCTEROTOMY  07/16/2021   Procedure: SPHINCTEROTOMY;  Surgeon: Milus Banister, MD;  Location: [facility];  Service: Endoscopy;;   TIBIA FRACTURE SURGERY      Current Outpatient Medications  Medication Sig Dispense Refill   acetaminophen (TYLENOL) 500 MG tablet Take 500-1,000 mg by mouth every 6 (six) hours as needed (pain.).     apixaban (ELIQUIS) 5 MG TABS tablet TAKE 1 TABLET BY MOUTH TWICE DAILY. 60 tablet 5   atorvastatin (LIPITOR) 20 MG tablet Take 1 tablet (20 mg total) by mouth daily. 90 tablet 3   Blood Glucose Monitoring Suppl (ONETOUCH VERIO) w/Device KIT Use as instructed. Check blood glucose twice daily. E11.42 1 kit 0   clopidogrel (PLAVIX) 75 MG tablet TAKE 1 TABLET BY MOUTH EVERY DAY WITH BREAKFAST 30 tablet 6   Cyanocobalamin 1000 MCG/ML LIQD Place 2,500 mcg under the tongue in the morning. Place 0.5 dropperful (2,500 mcg) sublingually daily     dapagliflozin propanediol (FARXIGA) 10 MG TABS tablet Take 1 tablet (10 mg total) by mouth daily. 90 tablet 3   ezetimibe (ZETIA) 10 MG tablet Take 1 tablet (10 mg total) by mouth daily. 90 tablet 0   gabapentin (NEURONTIN) 300 MG capsule Take 1 capsule (300 mg total) by mouth 2 (two) times daily. 180 capsule 3   glucose blood test strip      insulin aspart protamine- aspart (NOVOLOG MIX 70/30) (70-30) 100 UNIT/ML injection Inject 0.4-0.6 mLs (40-60 Units total) into the skin See admin instructions. Inject 60 units into the skin before breakfast and 40 units units before supper/evening meal 10 mL 0   Insulin Pen Needle (NOVOFINE) 30G X 8 MM MISC Inject 10 each into the skin as needed. E11.42 100 each 2   Lancets (ONETOUCH ULTRASOFT) lancets Use as instructed, Check blood  glucose twice daily. 100 each 12   Multiple Vitamins-Minerals (PRESERVISION AREDS PO) Take 1 capsule by mouth 2 (two) times daily.     ONETOUCH VERIO test strip CHECK BLOOD SUGAR TWICE DAILY. 100 each 11   Current Facility-Administered Medications  Medication Dose Route Frequency Provider Last Rate Last Admin   Insulin Pen Needle (NOVOFINE) 10 each  1 packet Subcutaneous PRN Cox, Kirsten, MD        Allergies  Allergen Reactions   Nitroglycerin Other (See Comments)    Blood pressure 'bottomed out'   Statins Other (See Comments)    Elevation of liver enzymes   Tetanus Toxoid Other (See Comments)    Severe flu symptoms fever and chills   Penicillins Rash    Social History   Socioeconomic History   Marital status: Married    Spouse name: Not on file   Number of children: Not on file   Years of education: Not on file   Highest education level: Not on file  Occupational History   Not on file  Tobacco Use   Smoking status: Former  Packs/day: 1.00    Years: 20.00    Total pack years: 20.00    Types: Cigarettes    Quit date: 10/19/1975    Years since quitting: 47.2   Smokeless tobacco: Never   Tobacco comments:    Former smoker 11/11/22  Substance and Sexual Activity   Alcohol use: No   Drug use: No   Sexual activity: Not on file  Other Topics Concern   Not on file  Social History Narrative   Not on file   Social Determinants of Health   Financial Resource Strain: High Risk (09/15/2022)   Overall Financial Resource Strain (CARDIA)    Difficulty of Paying Living Expenses: Hard  Food Insecurity: No Food Insecurity (09/10/2022)   Hunger Vital Sign    Worried About Running Out of Food in the Last Year: Never true    Ran Out of Food in the Last Year: Never true  Transportation Needs: No Transportation Needs (09/15/2022)   PRAPARE - Hydrologist (Medical): No    Lack of Transportation (Non-Medical): No  Physical Activity: Sufficiently  Active (09/10/2022)   Exercise Vital Sign    Days of Exercise per Week: 4 days    Minutes of Exercise per Session: 90 min  Stress: No Stress Concern Present (09/10/2022)   [place]    Feeling of Stress : Not at all  Social Connections: [place] (09/10/2022)   Social Connection and Isolation Panel [NHANES]    Frequency of Communication with Friends and Family: More than three times a week    Frequency of Social Gatherings with Friends and Family: More than three times a week    Attends Religious Services: More than 4 times per year    Active Member of [employer] or Organizations: Yes    Attends Archivist Meetings: 1 to 4 times per year    Marital Status: Married  Human resources officer Violence: Not At Risk (09/10/2022)   Humiliation, Afraid, Rape, and Kick questionnaire    Fear of Current or Ex-Partner: No    Emotionally Abused: No    Physically Abused: No    Sexually Abused: No     ROS- All systems are reviewed and negative except as per the HPI above.  Physical Exam: Vitals:   12/23/22 1018  BP: 110/72  Pulse: 83  Weight: 114 kg  Height: 6' 2"$  (1.88 m)     GEN- The patient is a well appearing elderly male, alert and oriented x 3 today.   HEENT-head normocephalic, atraumatic, sclera clear, conjunctiva pink, hearing intact, trachea midline. Lungs- Clear to ausculation bilaterally, normal work of breathing Heart- Regular rate and rhythm, no murmurs, rubs or gallops  GI- soft, NT, ND, + BS Extremities- no clubbing, cyanosis, or edema MS- no significant deformity or atrophy Skin- no rash or lesion Psych- euthymic mood, full affect Neuro- strength and sensation are intact   Wt Readings from Last 3 Encounters:  12/23/22 114 kg  12/02/22 114.3 kg  11/11/22 115.3 kg    EKG today demonstrates  SR, PAC Vent. rate 83 BPM PR interval 196 ms QRS duration 100 ms QT/QTcB 364/427 ms  Echo  08/13/22 demonstrated   1. Left ventricular ejection fraction, by estimation, is 55 to 60%. The  left ventricle has normal function. The left ventricle has no regional  wall motion abnormalities. Left ventricular diastolic parameters are  indeterminate.   2. Right ventricular systolic function was not well  visualized. The right  ventricular size is mildly enlarged. There is normal pulmonary artery  systolic pressure.   3. Left atrial size was moderately dilated.   4. Right atrial size was moderately dilated.   5. The mitral valve is normal in structure. Trivial mitral valve  regurgitation.   6. The aortic valve is tricuspid. Aortic valve regurgitation is not  visualized.   7. The inferior vena cava is normal in size with greater than 50%  respiratory variability, suggesting right atrial pressure of 3 mmHg.   Epic records are reviewed at length today  CHA2DS2-VASc Score = 5  The patient's score is based upon: CHF History: 0 HTN History: 1 Diabetes History: 1 Stroke History: 0 Vascular Disease History: 1 Age Score: 2 Gender Score: 0       ASSESSMENT AND PLAN: 1. Persistent Atrial Fibrillation (ICD10:  I48.19) The patient's CHA2DS2-VASc score is 5, indicating a 7.2% annual risk of stroke.   Patient appears to be maintaining SR.  Given his moderate biatrial enlargement he may eventually need AAD. We briefly discussed dofetilide and amiodarone. Would avoid class IC with CAD. Age is borderline for ablation.  Continue Eliquis 5 mg BID. Should be OK to hold anticoagulation for surgery now that he is > 4 weeks post DCCV.  BB discontinued due to orthostatic hypotension.  2. Secondary Hypercoagulable State (ICD10:  D68.69) The patient is at significant risk for stroke/thromboembolism based upon his CHA2DS2-VASc Score of 5.  Continue Apixaban (Eliquis).   3. Obesity Body mass index is 32.28 kg/m. Lifestyle modification was discussed and encouraged including regular physical activity  and weight reduction.  4. CAD/ischemic CM S/p CABG S/p NSTEMI 08/2022 with DES EF recovered On statin and Plavix. Defer timing of holding Plavix for surgery to Dr Johnsie Cancel.  No anginal symptoms.  5. HTN Stable, no changes today.   Follow up with Dr Johnsie Cancel or APP in 3 months. AF clinic in 6 months.    [facility] [address] [place], [place] [address] [phone] 12/23/2022 10:27 AM

## 2022-12-31 ENCOUNTER — Other Ambulatory Visit: Payer: Self-pay | Admitting: Family Medicine

## 2022-12-31 ENCOUNTER — Telehealth: Payer: Self-pay

## 2022-12-31 DIAGNOSIS — I252 Old myocardial infarction: Secondary | ICD-10-CM | POA: Diagnosis not present

## 2022-12-31 DIAGNOSIS — I10 Essential (primary) hypertension: Secondary | ICD-10-CM | POA: Diagnosis not present

## 2022-12-31 DIAGNOSIS — Z79899 Other long term (current) drug therapy: Secondary | ICD-10-CM | POA: Diagnosis not present

## 2022-12-31 DIAGNOSIS — I4729 Other ventricular tachycardia: Secondary | ICD-10-CM

## 2022-12-31 DIAGNOSIS — Z955 Presence of coronary angioplasty implant and graft: Secondary | ICD-10-CM | POA: Diagnosis not present

## 2022-12-31 DIAGNOSIS — E785 Hyperlipidemia, unspecified: Secondary | ICD-10-CM | POA: Diagnosis not present

## 2022-12-31 DIAGNOSIS — Z7901 Long term (current) use of anticoagulants: Secondary | ICD-10-CM | POA: Diagnosis not present

## 2022-12-31 DIAGNOSIS — Z7982 Long term (current) use of aspirin: Secondary | ICD-10-CM | POA: Diagnosis not present

## 2022-12-31 NOTE — Telephone Encounter (Signed)
Received fax from [facility] cardiac rehab. Patient having arrhythmia with exercise. Dr. Johnsie Cancel reviewed fax, and stated it looks like non-sustain VT and follow-up with EP. Will place referral.  Tried to call patient, phone number on chart is not a working number.

## 2023-01-07 ENCOUNTER — Telehealth: Payer: Self-pay

## 2023-01-07 NOTE — Progress Notes (Signed)
Care Management & Coordination Services Pharmacy Team  Reason for Encounter: Diabetes  Contacted patient to discuss diabetes disease state. Spoke with family on 01/07/2023   Current antihyperglycemic regimen:  Inslin Aspart 60 units am 40 units PM  Farxiga 10 mg daily   Patient verbally confirms he is taking the above medications as directed. Yes  What diet changes have been made to improve diabetes control? None  What recent interventions/DTPs have been made to improve glycemic control:  None  Have there been any recent hospitalizations or ED visits since last visit with PharmD? No  Patient denies hypoglycemic symptoms  Patient denies hyperglycemic symptoms  How often are you checking your blood sugar? once daily  What are your blood sugars ranging?  Fasting: 03-03 161, 03-04 138, 03-06 193, 03-07 164 Before meals: None After meals: None Bedtime: None  During the week, how often does your blood glucose drop below 70? Once a week Patient's wife stated sugars sometimes drops during cardiac rehab but not as much as it was.  Are you checking your feet daily/regularly? Yes  Adherence Review: Is the patient currently on a STATIN medication? Yes Is the patient currently on ACE/ARB medication? Yes Does the patient have >5 day gap between last estimated fill dates? No   Chart Updates: Recent office visits:  12-02-2022 CoxElnita Maxwell, MD. Glucose= 184, Creatinine= 1.33, eGFR= 53, CO2= 19, Alkaline Phosphatase= 128. A1C= 8.2. HDL= 7. STOP jardiance  Recent consult visits:  12-23-2022 Oliver Barre, [place] (Cardiology). Follow up visit  11-29-2022 Leonette Nutting [facility]). Annual diabetic foot exam.  Hospital visits:  None in previous 6 months  Medications: Outpatient Encounter Medications as of 01/07/2023  Medication Sig   acetaminophen (TYLENOL) 500 MG tablet Take 500-1,000 mg by mouth every 6 (six) hours as needed (pain.).   apixaban (ELIQUIS) 5 MG TABS tablet TAKE 1  TABLET BY MOUTH TWICE DAILY.   atorvastatin (LIPITOR) 20 MG tablet Take 1 tablet (20 mg total) by mouth daily.   Blood Glucose Monitoring Suppl (ONETOUCH VERIO) w/Device KIT Use as instructed. Check blood glucose twice daily. E11.42   clopidogrel (PLAVIX) 75 MG tablet TAKE 1 TABLET BY MOUTH EVERY DAY WITH BREAKFAST   Cyanocobalamin 1000 MCG/ML LIQD Place 2,500 mcg under the tongue in the morning. Place 0.5 dropperful (2,500 mcg) sublingually daily   dapagliflozin propanediol (FARXIGA) 10 MG TABS tablet Take 1 tablet (10 mg total) by mouth daily.   ezetimibe (ZETIA) 10 MG tablet Take 1 tablet (10 mg total) by mouth daily.   gabapentin (NEURONTIN) 300 MG capsule TAKE 1 CAPSULE BY MOUTH 2 TIMES DAILY.   glucose blood test strip    insulin aspart protamine- aspart (NOVOLOG MIX 70/30) (70-30) 100 UNIT/ML injection Inject 0.4-0.6 mLs (40-60 Units total) into the skin See admin instructions. Inject 60 units into the skin before breakfast and 40 units units before supper/evening meal   Insulin Pen Needle (NOVOFINE) 30G X 8 MM MISC Inject 10 each into the skin as needed. E11.42   Lancets (ONETOUCH ULTRASOFT) lancets Use as instructed, Check blood glucose twice daily.   Multiple Vitamins-Minerals (PRESERVISION AREDS PO) Take 1 capsule by mouth 2 (two) times daily.   ONETOUCH VERIO test strip CHECK BLOOD SUGAR TWICE DAILY.   Facility-Administered Encounter Medications as of 01/07/2023  Medication   Insulin Pen Needle (NOVOFINE) 10 each    Recent Relevant Labs: Lab Results  Component Value Date/Time   HGBA1C 8.2 (H) 12/14/2022 08:53 AM   HGBA1C 6.8 (H) 08/13/2022  03:25 AM   MICROALBUR 80 09/10/2021 11:44 AM   MICROALBUR 150 04/16/2021 02:11 PM    Kidney Function Lab Results  Component Value Date/Time   CREATININE 1.33 (H) 12/14/2022 08:53 AM   CREATININE 1.53 (H) 10/18/2022 11:15 AM   GFRNONAA >60 08/13/2022 03:25 AM   GFRAA 75 10/06/2020 01:46 PM    Star Rating Drugs:  Telmisartan 80 mg-  Last filled 11-02-2022 90 DS. Previous filled 08-02-2022 90 DS. Atorvastatin 20 mg- Last filled 11-08-2022 90 DS. Previous filled 08-02-2022 90 DS. Farxiga 10 mg- PAP  Care Gaps: Annual wellness visit in last year? Yes Last eye exam / retinopathy screening: 12-2021 Last diabetic foot exam: 08-30-2022   [facility] Pharmacist Assistant [phone]

## 2023-01-18 NOTE — Telephone Encounter (Cosign Needed)
Patient's wife was notified today

## 2023-01-20 NOTE — Progress Notes (Signed)
Office Visit    Patient Name: Andre Shaffer Date of Encounter: 01/20/2023  Primary Care Provider:  Rochel Brome, MD Primary Cardiologist:  Jenkins Rouge, MD Primary Electrophysiologist: None  Chief Complaint    Andre Shaffer is a 82 y.o. male with PMH of CAD s/p inferior wall MI, CABG x 3 in 1997, NSTEMI 08/2022 treated with PTCA/DES x 1 to SVG to OM, ICM, HTN, HLD, DM type II, PAF (on Eliquis), transaminitis with high intensity statins who presents today for 10-month follow-up of coronary artery disease.  Past Medical History    Past Medical History:  Diagnosis Date   Abnormal liver function    Arthritis    Atrial fibrillation ([place]) 08/12/2022   Eliquis   Coronary Artery Disease    s/p MI in 1997 followed by CABG // NSTEMI 08/2022 >> s/p DES to S-OM [L-LAD patent, S-Dx patent, S-RCA CTO] // Echo 2015: EF 45-50, inf HK // Echo 10/23: EF 55-60, no WMA   Diabetes mellitus without complication (HCC)    GERD (gastroesophageal reflux disease)    Hyperlipemia    Hypertension    MI (myocardial infarction) ([place])    SCC (squamous cell carcinoma), arm, left    SCC (squamous cell carcinoma), arm, 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   Past Surgical History:  Procedure Laterality Date   ANKLE SURGERY  93,03   rt and lt    CARDIOVERSION N/A 10/20/2022   Procedure: CARDIOVERSION;  Surgeon: Josue Hector, MD;  Location: [place] [facility];  Service: Cardiovascular;  Laterality: N/A;   CHOLECYSTECTOMY  10/19/2012   Procedure: LAPAROSCOPIC CHOLECYSTECTOMY WITH INTRAOPERATIVE CHOLANGIOGRAM;  Surgeon: Ralene Ok, MD;  Location: [place];  Service: General;  Laterality: N/A;   COLONOSCOPY     CORONARY ARTERY BYPASS GRAFT  1997   CORONARY STENT INTERVENTION N/A 08/12/2022   Procedure: CORONARY STENT INTERVENTION;  Surgeon: Burnell Blanks, MD;  Location: [place] [facility];  Service: Cardiovascular;  Laterality: N/A;   ENDOSCOPIC RETROGRADE CHOLANGIOPANCREATOGRAPHY (ERCP) WITH PROPOFOL  N/A 07/16/2021   Procedure: ENDOSCOPIC RETROGRADE CHOLANGIOPANCREATOGRAPHY (ERCP) WITH PROPOFOL;  Surgeon: Milus Banister, MD;  Location: [facility];  Service: Endoscopy;  Laterality: N/A;   LEFT HEART CATH AND CORS/GRAFTS ANGIOGRAPHY N/A 08/12/2022   Procedure: LEFT HEART CATH AND CORS/GRAFTS ANGIOGRAPHY;  Surgeon: Burnell Blanks, MD;  Location: [place] [facility];  Service: Cardiovascular;  Laterality: N/A;   REMOVAL OF STONES  07/16/2021   Procedure: REMOVAL OF STONES;  Surgeon: Milus Banister, MD;  Location: [facility];  Service: Endoscopy;;   SKIN CANCER EXCISION  12/2020   Squamous Cell Cancer   SPHINCTEROTOMY  07/16/2021   Procedure: SPHINCTEROTOMY;  Surgeon: Milus Banister, MD;  Location: [facility];  Service: Endoscopy;;   TIBIA FRACTURE SURGERY      Allergies  Allergies  Allergen Reactions   Nitroglycerin Other (See Comments)    Blood pressure 'bottomed out'   Statins Other (See Comments)    Elevation of liver enzymes   Tetanus Toxoid Other (See Comments)    Severe flu symptoms fever and chills   Penicillins Rash    History of Present Illness    Andre Shaffer  is a 82 year old male with the above mention past medical history who presents today for 94-month follow-up of coronary artery disease and atrial fibrillation.  Andre Shaffer has an extensive coronary artery disease history with inferior MI and CABG x 3 in 1997 by Dr. Pia Mau.  He had a Myoview  completed in 2015 that showed inferior lateral akinesis with fixed inferior lateral defect consistent with scar and overall low risk study.  He presented to the ED at [facility] 08/2022 with sudden onset of substernal chest tightness and mild nausea.  EKG was completed and showed atrial fibrillation with a rate of 73 and ST elevation concerning for possible inferior subendocardial injury.  Prior to ED visit patient took two 325 mg aspirins and had a significant rise in troponins once transferred to [facility]  for urgent heart cath. Patient had successful PTCA/DESx1, body of SVG to OM. He has remained chest pain free.  He was discharged with triple therapy of ASA/Plavix/Eliquis.  ASA was discontinued 1 month later and patient continued on Eliquis and Plavix.  He underwent DCCV on 10/20/2022 and converted back to atrial fibrillation 11/2022.  He was seen in the AF clinic by [facility] and was maintaining sinus rhythm with occasional PACs.  AAD therapy was discussed during visit and patient was seen most recently on 12/23/2022 for follow-up.  He completed cardiac rehab at [facility] and beta-blocker and ARB were discontinued due to symptomatic hypotension.  During visit patient was maintaining sinus rhythm and was pending Moh's surgery.  Andre Shaffer presents today with his wife for 40-month follow-up.  Since last being seen in the office patient reports that he has been doing well from a cardiac perspective with no complaints of chest pain since previous visit.  He is maintaining sinus rhythm and heart rate was controlled at 78 bpm.  His blood pressure today were also well-controlled at 120/68.  During our visit we discussed the pathophysiology of atrial fibrillation and also reviewed triggers and substances to avoid to decrease risk of atrial fibrillation recurring.  They had all of their questions answered to her satisfaction.  Patient denies chest pain, palpitations, dyspnea, PND, orthopnea, nausea, vomiting, dizziness, syncope, edema, weight gain, or early satiety.  Home Medications    Current Outpatient Medications  Medication Sig Dispense Refill   acetaminophen (TYLENOL) 500 MG tablet Take 500-1,000 mg by mouth every 6 (six) hours as needed (pain.).     apixaban (ELIQUIS) 5 MG TABS tablet TAKE 1 TABLET BY MOUTH TWICE DAILY. 60 tablet 5   atorvastatin (LIPITOR) 20 MG tablet Take 1 tablet (20 mg total) by mouth daily. 90 tablet 3   Blood Glucose Monitoring Suppl (ONETOUCH VERIO) w/Device KIT Use as  instructed. Check blood glucose twice daily. E11.42 1 kit 0   clopidogrel (PLAVIX) 75 MG tablet TAKE 1 TABLET BY MOUTH EVERY DAY WITH BREAKFAST 30 tablet 6   Cyanocobalamin 1000 MCG/ML LIQD Place 2,500 mcg under the tongue in the morning. Place 0.5 dropperful (2,500 mcg) sublingually daily     dapagliflozin propanediol (FARXIGA) 10 MG TABS tablet Take 1 tablet (10 mg total) by mouth daily. 90 tablet 3   ezetimibe (ZETIA) 10 MG tablet Take 1 tablet (10 mg total) by mouth daily. 90 tablet 0   gabapentin (NEURONTIN) 300 MG capsule TAKE 1 CAPSULE BY MOUTH 2 TIMES DAILY. 180 capsule 2   glucose blood test strip      insulin aspart protamine- aspart (NOVOLOG MIX 70/30) (70-30) 100 UNIT/ML injection Inject 0.4-0.6 mLs (40-60 Units total) into the skin See admin instructions. Inject 60 units into the skin before breakfast and 40 units units before supper/evening meal 10 mL 0   Insulin Pen Needle (NOVOFINE) 30G X 8 MM MISC Inject 10 each into the skin as needed. E11.42 100 each 2  Lancets (ONETOUCH ULTRASOFT) lancets Use as instructed, Check blood glucose twice daily. 100 each 12   Multiple Vitamins-Minerals (PRESERVISION AREDS PO) Take 1 capsule by mouth 2 (two) times daily.     ONETOUCH VERIO test strip CHECK BLOOD SUGAR TWICE DAILY. 100 each 11   Current Facility-Administered Medications  Medication Dose Route Frequency Provider Last Rate Last Admin   Insulin Pen Needle (NOVOFINE) 10 each  1 packet Subcutaneous PRN Cox, Kirsten, MD         Review of Systems  Please see the history of present illness.     All other systems reviewed and are otherwise negative except as noted above.  Physical Exam    Wt Readings from Last 3 Encounters:  12/23/22 251 lb 6.4 oz (114 kg)  12/02/22 252 lb (114.3 kg)  11/11/22 254 lb 3.2 oz (115.3 kg)   IR:JJOAC were no vitals filed for this visit.,There is no height or weight on file to calculate BMI.  Constitutional:      Appearance: Healthy appearance. Not in  distress.  Neck:     Vascular: JVD normal.  Pulmonary:     Effort: Pulmonary effort is normal.     Breath sounds: No wheezing. No rales. Diminished in the bases Cardiovascular:     Normal rate. Regular rhythm. Normal S1. Normal S2.      Murmurs: There is no murmur.  Edema:    Peripheral edema absent.  Abdominal:     Palpations: Abdomen is soft non tender. There is no hepatomegaly.  Skin:    General: Skin is warm and dry.  Neurological:     General: No focal deficit present.     Mental Status: Alert and oriented to person, place and time.     Cranial Nerves: Cranial nerves are intact.  EKG/LABS/ Recent Cardiac Studies    ECG personally reviewed by me today -sinus rhythm with PVCs and rate of 82 bpm with no acute changes consistent with previous EKG  Risk Assessment/Calculations:    CHA2DS2-VASc Score = 5   This indicates a 7.2% annual risk of stroke. The patient's score is based upon: CHF History: 0 HTN History: 1 Diabetes History: 1 Stroke History: 0 Vascular Disease History: 1 Age Score: 2 Gender Score: 0           Lab Results  Component Value Date   WBC 7.6 12/14/2022   HGB 16.3 12/14/2022   HCT 48.4 12/14/2022   MCV 96 12/14/2022   PLT 164 12/14/2022   Lab Results  Component Value Date   CREATININE 1.33 (H) 12/14/2022   BUN 18 12/14/2022   NA 140 12/14/2022   K 4.6 12/14/2022   CL 102 12/14/2022   CO2 19 (L) 12/14/2022   Lab Results  Component Value Date   ALT 18 12/14/2022   AST 21 12/14/2022   ALKPHOS 128 (H) 12/14/2022   BILITOT 0.6 12/14/2022   Lab Results  Component Value Date   CHOL 131 12/14/2022   HDL 37 (L) 12/14/2022   LDLCALC 69 12/14/2022   TRIG 144 12/14/2022   CHOLHDL 3.5 12/14/2022    Lab Results  Component Value Date   HGBA1C 8.2 (H) 12/14/2022    Cardiac Studies & Procedures   CARDIAC CATHETERIZATION  CARDIAC CATHETERIZATION 08/13/2022  Narrative   Prox RCA lesion is 100% stenosed.   Origin to Prox Graft lesion  is 100% stenosed.   Ost LAD to Mid LAD lesion is 60% stenosed.   Mid LAD lesion is 100%  stenosed.   Mid Cx to Dist Cx lesion is 100% stenosed.   Mid Graft lesion is 99% stenosed.   A drug-eluting stent was successfully placed using a SYNERGY XD 3.0X16.   Post intervention, there is a 0% residual stenosis.   SVG graft was not visualized.   LIMA graft was visualized by angiography.   SVG graft was visualized by angiography.  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 The mid and distal LAD fills from the patent LIMA graft. There is diffuse disease in the LAD beyond graft insertion. Chronic occlusion distal Circumflex. Patent SVG to OM with severe disease in distal body of SVG to OM Chronic occlusion of the proximal RCA. Occluded vein graft to RCA. The RCA fills from left to right collaterals. LVEDP=16-18 mmHg  Recommendations: Continue DAPT with ASA and Plavix for one month then stop ASA as he will likely be discharged on a DOAC with new atrial fib. Continue beta blocker and statin.  Findings Coronary Findings Diagnostic  Dominance: Right  Left Anterior Descending Vessel is large. Ost LAD to Mid LAD lesion is 60% stenosed. Mid LAD lesion is 100% stenosed. The lesion is chronically occluded.  Left Circumflex Vessel is large. Mid Cx to Dist Cx lesion is 100% stenosed. The lesion is chronically occluded.  Second Obtuse Marginal Branch  Right Coronary Artery Vessel is large. Prox RCA lesion is 100% stenosed. The lesion is chronically occluded.  Third Right Posterolateral Branch Collaterals 3rd RPL filled by collaterals from 2nd Mrg.  Saphenous Graft To Dist RCA SVG graft was not visualized. Origin to Prox Graft lesion is 100% stenosed. The lesion is chronically occluded.  LIMA LIMA Graft To Dist LAD LIMA graft was visualized by  angiography.  Graft To 3rd Mrg Mid Graft lesion is 99% stenosed.  Saphenous Graft To 2nd Diag SVG graft was visualized by angiography.  Intervention  Mid Graft lesion (Graft To 3rd Mrg) Stent CATHETER LAUNCHER 6FR AL1 guide catheter was inserted. Lesion crossed with guidewire using a WIRE COUGAR XT STRL 190CM. Pre-stent angioplasty was performed using a BALLN SAPPHIRE 2.5X12. A drug-eluting stent was successfully placed using a SYNERGY XD 3.0X16. Stent strut is well apposed. Post-stent angioplasty was not performed. Post-Intervention Lesion Assessment The intervention was successful. Pre-interventional TIMI flow is 3. Post-intervention TIMI flow is 3. No complications occurred at this lesion. There is a 0% residual stenosis post intervention.     ECHOCARDIOGRAM  ECHOCARDIOGRAM COMPLETE 08/23/2022  Narrative ECHOCARDIOGRAM REPORT    Patient Name:   Andre Shaffer Date of Exam: 08/13/2022 Medical Rec #:  [id number]      Height:       73.0 in Accession #:    [id number]     Weight:       265.0 lb Date of Birth:  01/31/1941      BSA:          2.425 m Patient Age:    53 years       BP:           149/75 mmHg Patient Gender: M              HR:           83 bpm. Exam Location:  Inpatient  Procedure: 2D Echo, Cardiac Doppler, Color Doppler and Intracardiac Opacification Agent  Indications:  Acute myocardial infarction, unspecified I21.9  History:         Patient has prior history of Echocardiogram examinations, most recent 05/15/2014. Previous Myocardial Infarction; Risk Factors:Diabetes, Dyslipidemia, GERD and Hypertension.  Sonographer:     Bernadene Person RDCS Referring Phys:  Lily Kocher Diagnosing Phys: [place]  IMPRESSIONS   1. Left ventricular ejection fraction, by estimation, is 55 to 60%. The left ventricle has normal function. The left ventricle has no regional wall motion abnormalities. Left ventricular diastolic parameters are indeterminate. 2. Right  ventricular systolic function was not well visualized. The right ventricular size is mildly enlarged. There is normal pulmonary artery systolic pressure. 3. Left atrial size was moderately dilated. 4. Right atrial size was moderately dilated. 5. The mitral valve is normal in structure. Trivial mitral valve regurgitation. 6. The aortic valve is tricuspid. Aortic valve regurgitation is not visualized. 7. The inferior vena cava is normal in size with greater than 50% respiratory variability, suggesting right atrial pressure of 3 mmHg.  FINDINGS Left Ventricle: Left ventricular ejection fraction, by estimation, is 55 to 60%. The left ventricle has normal function. The left ventricle has no regional wall motion abnormalities. Definity contrast agent was given IV to delineate the left ventricular endocardial borders. The left ventricular internal cavity size was normal in size. There is borderline left ventricular hypertrophy. Abnormal (paradoxical) septal motion consistent with post-operative status. Left ventricular diastolic parameters are indeterminate.  Right Ventricle: The right ventricular size is mildly enlarged. Right ventricular systolic function was not well visualized. There is normal pulmonary artery systolic pressure. The tricuspid regurgitant velocity is 2.51 m/s, and with an assumed right atrial pressure of 3 mmHg, the estimated right ventricular systolic pressure is [id number] mmHg.  Left Atrium: Left atrial size was moderately dilated.  Right Atrium: Right atrial size was moderately dilated.  Pericardium: There is no evidence of pericardial effusion.  Mitral Valve: The mitral valve is normal in structure. Trivial mitral valve regurgitation.  Tricuspid Valve: The tricuspid valve is grossly normal. Tricuspid valve regurgitation is trivial.  Aortic Valve: The aortic valve is tricuspid. Aortic valve regurgitation is not visualized.  Pulmonic Valve: The pulmonic valve was not well  visualized. Pulmonic valve regurgitation is not visualized.  Aorta: The aortic root and ascending aorta are structurally normal, with no evidence of dilitation.  Venous: The inferior vena cava is normal in size with greater than 50% respiratory variability, suggesting right atrial pressure of 3 mmHg.  IAS/Shunts: The interatrial septum was not well visualized.   LEFT VENTRICLE PLAX 2D LVIDd:         5.20 cm LVIDs:         3.80 cm LV PW:         1.00 cm LV IVS:        1.00 cm LVOT diam:     2.10 cm LV SV:         68 LV SV Index:   28 LVOT Area:     3.46 cm  LV Volumes (MOD) LV vol d, MOD A2C: 105.0 ml LV vol d, MOD A4C: 106.0 ml LV vol s, MOD A2C: 38.3 ml LV vol s, MOD A4C: 39.7 ml LV SV MOD A2C:     66.7 ml LV SV MOD A4C:     106.0 ml LV SV MOD BP:      67.1 ml  RIGHT VENTRICLE RV S prime:     9.83 cm/s TAPSE (M-mode): 1.1 cm  LEFT ATRIUM  Index        RIGHT ATRIUM           Index LA diam:        5.40 cm 2.23 cm/m   RA Area:     30.50 cm LA Vol (A2C):   81.2 ml 33.48 ml/m  RA Volume:   111.00 ml 45.77 ml/m LA Vol (A4C):   82.1 ml 33.86 ml/m LA Biplane Vol: 84.6 ml 34.89 ml/m AORTIC VALVE LVOT Vmax:   103.65 cm/s LVOT Vmean:  70.500 cm/s LVOT VTI:    0.198 m  AORTA Ao Root diam: 3.60 cm Ao Asc diam:  3.60 cm  TRICUSPID VALVE TR Peak grad:   25.2 mmHg TR Vmax:        251.00 cm/s  SHUNTS Systemic VTI:  0.20 m Systemic Diam: 2.10 cm  Phineas Inches Electronically signed by Phineas Inches Signature Date/Time: 08/13/2022/11:02:28 AM    Final (Updated)             Assessment & Plan    1.  Coronary artery disease: -s/p CABG x 3 in 1997 with NSTEMI 08/2022 with DES to vein graft -Today patient reports no recurrent chest pain or anginal equivalent.   -Continue GDMT withLipitor 20 mg daily, Plavix 75 mg daily, ezetimibe 10 mg daily -He was encouraged to increase physical activity as tolerated.  2.  Atrial fibrillation: -Patient recently seen  in the AF clinic and was maintaining sinus rhythm. -He is currently not on rate control with beta-blocker due to hypotension and orthostasis during cardiac rehab. -Today patient is maintaining sinus rhythm with occasional PVCs. -Most recent creatinine is 1.3 and hemoglobin is 5.1 -Continue Eliquis 5 mg twice daily -CHA2DS2-VASc score of 5  3.  Essential hypertension: -Patient's blood pressure today is well-controlled at 120/68  4.  Hyperlipidemia: -Patient's last LDL cholesterol was 69 at goal -Continue Zetia and atorvastatin  5.  DM type II: -Patient's last hemoglobin A1c was 8.2 -Continue glycemic control per PCP and endocrinology  Disposition: Follow-up with Jenkins Rouge, MD or APP in 6 months    Medication Adjustments/Labs and Tests Ordered: Current medicines are reviewed at length with the patient today.  Concerns regarding medicines are outlined above.   Signed, Mable Fill, Marissa Nestle, NP 01/20/2023, 1:21 PM [facility]  Note:  This document was prepared using Dragon voice recognition software and may include unintentional dictation errors.

## 2023-01-21 ENCOUNTER — Encounter: Payer: Self-pay | Admitting: Nurse Practitioner

## 2023-01-21 ENCOUNTER — Ambulatory Visit: Payer: PPO | Attending: Nurse Practitioner | Admitting: Nurse Practitioner

## 2023-01-21 VITALS — BP 120/68 | HR 78 | Ht 74.0 in | Wt 247.2 lb

## 2023-01-21 DIAGNOSIS — I2510 Atherosclerotic heart disease of native coronary artery without angina pectoris: Secondary | ICD-10-CM

## 2023-01-21 DIAGNOSIS — E782 Mixed hyperlipidemia: Secondary | ICD-10-CM

## 2023-01-21 DIAGNOSIS — I119 Hypertensive heart disease without heart failure: Secondary | ICD-10-CM | POA: Diagnosis not present

## 2023-01-21 DIAGNOSIS — Z794 Long term (current) use of insulin: Secondary | ICD-10-CM

## 2023-01-21 DIAGNOSIS — I4891 Unspecified atrial fibrillation: Secondary | ICD-10-CM

## 2023-01-21 DIAGNOSIS — E1165 Type 2 diabetes mellitus with hyperglycemia: Secondary | ICD-10-CM | POA: Diagnosis not present

## 2023-01-21 NOTE — Patient Instructions (Signed)
Medication Instructions:  Your physician recommends that you continue on your current medications as directed. Please refer to the Current Medication list given to you today. *If you need a refill on your cardiac medications before your next appointment, please call your pharmacy*   Lab Work: None Ordered   Testing/Procedures: None Ordered   Follow-Up: At [facility], you and your health needs are our priority.  As part of our continuing mission to provide you with exceptional heart care, we have created designated Provider Care Teams.  These Care Teams include your primary Cardiologist (physician) and Advanced Practice Providers (APPs -  Physician Assistants and Nurse Practitioners) who all work together to provide you with the care you need, when you need it.  We recommend signing up for the patient portal called "MyChart".  Sign up information is provided on this After Visit Summary.  MyChart is used to connect with patients for Virtual Visits (Telemedicine).  Patients are able to view lab/test results, encounter notes, upcoming appointments, etc.  Non-urgent messages can be sent to your provider as well.   To learn more about what you can do with MyChart, go to [email or web address].    Your next appointment:   6 month(s)  Provider:   Peter Nishan, MD     Other Instructions   

## 2023-01-25 DIAGNOSIS — D04121 Carcinoma in situ of skin of left upper eyelid, including canthus: Secondary | ICD-10-CM | POA: Diagnosis not present

## 2023-02-05 ENCOUNTER — Other Ambulatory Visit: Payer: Self-pay | Admitting: Family Medicine

## 2023-02-10 ENCOUNTER — Telehealth: Payer: Self-pay

## 2023-02-10 NOTE — Progress Notes (Signed)
Care Management & Coordination Services Pharmacy Team  Reason for Encounter: Diabetes  Contacted patient to discuss diabetes disease state. {US HC Outreach:28874}  Current antihyperglycemic regimen:  ***   Patient verbally confirms he is taking the above medications as directed. {yes/no:20286}  What diet changes have been made to improve diabetes control?  What recent interventions/DTPs have been made to improve glycemic control:  ***  Have there been any recent hospitalizations or ED visits since last visit with PharmD? {yes/no:20286}  Patient {reports/denies:24182} hypoglycemic symptoms, including {Hypoglycemic Symptoms:3049003}  Patient {reports/denies:24182} hyperglycemic symptoms, including {symptoms; hyperglycemia:17903}  How often are you checking your blood sugar? {BG Testing frequency:23922}  What are your blood sugars ranging?  Fasting: *** Before meals: *** After meals: *** Bedtime: ***  During the week, how often does your blood glucose drop below 70? {LowBGfrequency:24142}  Are you checking your feet daily/regularly? {yes/no:20286}  Adherence Review: Is the patient currently on a STATIN medication? {yes/no:20286} Is the patient currently on ACE/ARB medication? {yes/no:20286} Does the patient have >5 day gap between last estimated fill dates? {yes/no:20286}   Chart Updates: Recent office visits:  None  Recent consult visits:  01-21-2023 Gaston Islam., NP (Cardiology). EKG completed.  01-13-2023 [facility] Assessment for hearing aid  Hospital visits:  None in previous 6 months  Medications: Outpatient Encounter Medications as of 02/10/2023  Medication Sig   acetaminophen (TYLENOL) 500 MG tablet Take 500-1,000 mg by mouth every 6 (six) hours as needed (pain.).   apixaban (ELIQUIS) 5 MG TABS tablet TAKE 1 TABLET BY MOUTH TWICE DAILY.   atorvastatin (LIPITOR) 20 MG tablet TAKE 1 TABLET BY MOUTH DAILY.   Blood Glucose  Monitoring Suppl (ONETOUCH VERIO) w/Device KIT Use as instructed. Check blood glucose twice daily. E11.42   clopidogrel (PLAVIX) 75 MG tablet TAKE 1 TABLET BY MOUTH EVERY DAY WITH BREAKFAST   Cyanocobalamin 1000 MCG/ML LIQD Place 2,500 mcg under the tongue in the morning. Place 0.5 dropperful (2,500 mcg) sublingually daily   dapagliflozin propanediol (FARXIGA) 10 MG TABS tablet Take 1 tablet (10 mg total) by mouth daily.   ezetimibe (ZETIA) 10 MG tablet Take 1 tablet (10 mg total) by mouth daily.   gabapentin (NEURONTIN) 300 MG capsule TAKE 1 CAPSULE BY MOUTH 2 TIMES DAILY.   glucose blood test strip    insulin aspart protamine- aspart (NOVOLOG MIX 70/30) (70-30) 100 UNIT/ML injection Inject 0.4-0.6 mLs (40-60 Units total) into the skin See admin instructions. Inject 60 units into the skin before breakfast and 40 units units before supper/evening meal   Insulin Pen Needle (NOVOFINE) 30G X 8 MM MISC Inject 10 each into the skin as needed. E11.42   Lancets (ONETOUCH ULTRASOFT) lancets Use as instructed, Check blood glucose twice daily.   Multiple Vitamins-Minerals (PRESERVISION AREDS PO) Take 1 capsule by mouth 2 (two) times daily.   ONETOUCH VERIO test strip CHECK BLOOD SUGAR TWICE DAILY.   Facility-Administered Encounter Medications as of 02/10/2023  Medication   Insulin Pen Needle (NOVOFINE) 10 each    Recent Relevant Labs: Lab Results  Component Value Date/Time   HGBA1C 8.2 (H) 12/14/2022 08:53 AM   HGBA1C 6.8 (H) 08/13/2022 03:25 AM   MICROALBUR 80 09/10/2021 11:44 AM   MICROALBUR 150 04/16/2021 02:11 PM    Kidney Function Lab Results  Component Value Date/Time   CREATININE 1.33 (H) 12/14/2022 08:53 AM   CREATININE 1.53 (H) 10/18/2022 11:15 AM   GFRNONAA >60 08/13/2022 03:25 AM   GFRAA 75 10/06/2020 01:46 PM  02-10-2023: 1st attempt left VM 02-11-2023: 2nd attempt left VM  Star Rating Drugs: Atorvastatin 20 mg- Last filled 02-07-2023 90 DS. Previous filled 11-08-2021 90  DS. Farxiga 10 mg- PAP  Care Gaps: Annual wellness visit in last year? Yes Last eye exam / retinopathy screening:12-2021  Last diabetic foot exam:08-30-2022    Huey RomansMalecca Hicks [facility]MA Clinical Pharmacist Assistant [phone]

## 2023-03-06 NOTE — Assessment & Plan Note (Addendum)
Well controlled.  No medicines.  Continue to work on eating a healthy diet and exercise.

## 2023-03-06 NOTE — Assessment & Plan Note (Signed)
Control: not at goal Recommend check sugars fasting daily. Recommend check feet daily. Recommend annual eye exams. Medicines: Continue Novolog insulin injection 70/30 60 U in am and 40 U before supper, farxiga 10 mg daily.  Continue to work on eating a healthy diet and exercise.

## 2023-03-06 NOTE — Progress Notes (Unsigned)
Subjective:  Patient ID: Andre Shaffer, male    DOB: 1940/11/12  Age: 82 y.o. MRN: [id number]  Chief Complaint  Patient presents with   Medical Management of Chronic Issues    HPI Diabetes:  Complications: neuropathy.  Glucose checking: Checks sugars fasting daily Glucose logs: 100-150.  Hypoglycemia: once. Increased activity.  Most recent A1C: 8.2 Current medications:  Novolog insulin injection 70/30 60 U in am and 40 U before supper, Gabapentin 300 mg 1 capsule twice daily, farxiga 10 mg daily. Last Eye Exam: Patient sees Dr. Genia Del in [place].  Foot checks: daily. (Seen by podiatrist April 2024).    Hyperlipidemia: Current medications: Atorvastatin 20 mg daily, Zetia 10 mg daily.   Hypertension/CAD: On plavix, lipitor.  Current medications: Diet: healthy.  Exercise:  going to cardiopulmonary rehab  Atriawl fibrillation: rate controlled. On eliquis     12/02/2022   10:49 AM 09/10/2022   11:36 AM 05/18/2022    9:30 AM 09/10/2021   10:37 AM 05/12/2021    8:33 AM  Depression screen PHQ 2/9  Decreased Interest 0 0 0 0 0  Down, Depressed, Hopeless 0 0 0 0 0  PHQ - 2 Score 0 0 0 0 0        12/02/2022   10:49 AM  Fall Risk   Falls in the past year? 0  Number falls in past yr: 0  Injury with Fall? 0  Risk for fall due to : No Fall Risks  Follow up Falls evaluation completed    Patient Care Team: Blane Ohara, MD as PCP - General (Family Medicine) Wendall Stade, MD as PCP - Cardiology (Cardiology) Marlowe Sax, RN as Case Manager (General Practice) Zettie Pho, [facility] (Pharmacist)   Review of Systems  Constitutional:  Negative for chills and fever.  HENT:  Negative for congestion, rhinorrhea and sore throat.   Respiratory:  Negative for cough and shortness of breath.   Cardiovascular:  Negative for chest pain and palpitations.  Gastrointestinal:  Negative for abdominal pain, constipation, diarrhea, nausea and vomiting.  Genitourinary:  Negative for  dysuria and urgency.  Musculoskeletal:  Positive for arthralgias. Negative for back pain and myalgias.  Neurological:  Positive for numbness (neuropathy in feet L>R.). Negative for dizziness and headaches.  Psychiatric/Behavioral:  Negative for dysphoric mood. The patient is not nervous/anxious.     Current Outpatient Medications on File Prior to Visit  Medication Sig Dispense Refill   acetaminophen (TYLENOL) 500 MG tablet Take 500-1,000 mg by mouth every 6 (six) hours as needed (pain.).     apixaban (ELIQUIS) 5 MG TABS tablet TAKE 1 TABLET BY MOUTH TWICE DAILY. 60 tablet 5   atorvastatin (LIPITOR) 20 MG tablet TAKE 1 TABLET BY MOUTH DAILY. 90 tablet 2   Blood Glucose Monitoring Suppl (ONETOUCH VERIO) w/Device KIT Use as instructed. Check blood glucose twice daily. E11.42 1 kit 0   clopidogrel (PLAVIX) 75 MG tablet TAKE 1 TABLET BY MOUTH EVERY DAY WITH BREAKFAST 30 tablet 6   Cyanocobalamin 1000 MCG/ML LIQD Place 2,500 mcg under the tongue in the morning. Place 0.5 dropperful (2,500 mcg) sublingually daily     dapagliflozin propanediol (FARXIGA) 10 MG TABS tablet Take 1 tablet (10 mg total) by mouth daily. 90 tablet 3   gabapentin (NEURONTIN) 300 MG capsule TAKE 1 CAPSULE BY MOUTH 2 TIMES DAILY. 180 capsule 2   glucose blood test strip      insulin aspart protamine- aspart (NOVOLOG MIX 70/30) (70-30) 100 UNIT/ML injection  Inject 0.4-0.6 mLs (40-60 Units total) into the skin See admin instructions. Inject 60 units into the skin before breakfast and 40 units units before supper/evening meal 10 mL 0   Insulin Pen Needle (NOVOFINE) 30G X 8 MM MISC Inject 10 each into the skin as needed. E11.42 100 each 2   Lancets (ONETOUCH ULTRASOFT) lancets Use as instructed, Check blood glucose twice daily. 100 each 12   Multiple Vitamins-Minerals (PRESERVISION AREDS PO) Take 1 capsule by mouth 2 (two) times daily.     ONETOUCH VERIO test strip CHECK BLOOD SUGAR TWICE DAILY. 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 // NSTEMI 08/2022 >> s/p DES to S-OM [L-LAD patent, S-Dx patent, S-RCA CTO] // Echo 2015: EF 45-50, inf HK // Echo 10/23: EF 55-60, no WMA   Diabetes mellitus without complication (HCC)    GERD (gastroesophageal reflux disease)    Hyperlipemia    Hypertension    MI (myocardial infarction) (HCC)    SCC (squamous cell carcinoma), arm, left    SCC (squamous cell carcinoma), arm, 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   Past Surgical History:  Procedure Laterality Date   ANKLE SURGERY  93,03   rt and lt    CARDIOVERSION N/A 10/20/2022   Procedure: CARDIOVERSION;  Surgeon: Wendall Stade, MD;  Location: [facility];  Service: Cardiovascular;  Laterality: N/A;   CHOLECYSTECTOMY  10/19/2012   Procedure: LAPAROSCOPIC CHOLECYSTECTOMY WITH INTRAOPERATIVE CHOLANGIOGRAM;  Surgeon: Axel Filler, MD;  Location: [facility];  Service: General;  Laterality: N/A;   COLONOSCOPY     CORONARY ARTERY BYPASS GRAFT  1997   CORONARY STENT INTERVENTION N/A 08/12/2022   Procedure: CORONARY STENT INTERVENTION;  Surgeon: Kathleene Hazel, MD;  Location: [facility];  Service: Cardiovascular;  Laterality: N/A;   ENDOSCOPIC RETROGRADE CHOLANGIOPANCREATOGRAPHY (ERCP) WITH PROPOFOL N/A 07/16/2021   Procedure: ENDOSCOPIC RETROGRADE CHOLANGIOPANCREATOGRAPHY (ERCP) WITH PROPOFOL;  Surgeon: Rachael Fee, MD;  Location: [facility];  Service: Endoscopy;  Laterality: N/A;   LEFT HEART CATH AND CORS/GRAFTS ANGIOGRAPHY N/A 08/12/2022   Procedure: LEFT HEART CATH AND CORS/GRAFTS ANGIOGRAPHY;  Surgeon: Kathleene Hazel, MD;  Location: [facility];  Service: Cardiovascular;  Laterality: N/A;   REMOVAL OF STONES  07/16/2021   Procedure: REMOVAL OF STONES;  Surgeon: Rachael Fee, MD;  Location: [facility];  Service: Endoscopy;;   SKIN CANCER EXCISION  12/2020   Squamous Cell Cancer   SPHINCTEROTOMY  07/16/2021   Procedure: SPHINCTEROTOMY;  Surgeon: Rachael Fee, MD;  Location: [facility];  Service: Endoscopy;;   TIBIA FRACTURE SURGERY      History reviewed. No pertinent family history. Social History   Socioeconomic History   Marital status: Married    Spouse name: Not on file   Number of children: Not on file   Years of education: Not on file   Highest education level: Not on file  Occupational History   Not on file  Tobacco Use   Smoking status: Former    Packs/day: 1.00    Years: 20.00  Additional pack years: 0.00    Total pack years: 20.00    Types: Cigarettes    Quit date: 10/19/1975    Years since quitting: 47.4   Smokeless tobacco: Never   Tobacco comments:    Former smoker 11/11/22  Substance and Sexual Activity   Alcohol use: No   Drug use: No   Sexual activity: Not on file  Other Topics Concern   Not on file  Social History Narrative   Not on file   Social Determinants of Health   Financial Resource Strain: High Risk (09/15/2022)   Overall Financial Resource Strain (CARDIA)    Difficulty of Paying Living Expenses: Hard  Food Insecurity: No Food Insecurity (09/10/2022)   Hunger Vital Sign    Worried About Running Out of Food in the Last Year: Never true    Ran Out of Food in the Last Year: Never true  Transportation Needs: No Transportation Needs (09/15/2022)   PRAPARE - Administrator, Civil Service (Medical): No    Lack of Transportation (Non-Medical): No  Physical Activity: Sufficiently Active (09/10/2022)   Exercise Vital Sign    Days of Exercise per Week: 4 days    Minutes of Exercise per Session: 90 min  Stress: No  Stress Concern Present (09/10/2022)   [employer] of Occupational Health - Occupational Stress Questionnaire    Feeling of Stress : Not at all  Social Connections: Socially Integrated (09/10/2022)   Social Connection and Isolation Panel [NHANES]    Frequency of Communication with Friends and Family: More than three times a week    Frequency of Social Gatherings with Friends and Family: More than three times a week    Attends Religious Services: More than 4 times per year    Active Member of [employer] or Organizations: Yes    Attends Banker Meetings: 1 to 4 times per year    Marital Status: Married    Objective:  BP 110/60   Pulse 84   Temp (!) 97.1 F (36.2 C)   Resp 16   Ht 6\' 1"  (1.854 m)   Wt 242 lb 9.6 oz (110 kg)   BMI 32.01 kg/m      03/07/2023    8:38 AM 01/21/2023    9:35 AM 12/23/2022   10:18 AM  BP/Weight  Systolic BP 110 120 110  Diastolic BP 60 68 72  Wt. (Lbs) 242.6 247.2 251.4  BMI 32.01 kg/m2 31.74 kg/m2 32.28 kg/m2    Physical Exam Vitals reviewed.  Constitutional:      Appearance: Normal appearance.  Neck:     Vascular: No carotid bruit.  Cardiovascular:     Rate and Rhythm: Normal rate. Rhythm irregular.     Pulses: Normal pulses.     Heart sounds: Normal heart sounds.  Pulmonary:     Effort: Pulmonary effort is normal.     Breath sounds: Normal breath sounds. No wheezing, rhonchi or rales.  Abdominal:     General: Bowel sounds are normal.     Palpations: Abdomen is soft.     Tenderness: There is no abdominal tenderness.  Neurological:     Mental Status: He is alert and oriented to person, place, and time.  Psychiatric:        Mood and Affect: Mood normal.        Behavior: Behavior normal.     Diabetic Foot Exam - Simple   No data filed      Lab Results  Component  Value Date   WBC 7.6 12/14/2022   HGB 16.3 12/14/2022   HCT 48.4 12/14/2022   PLT 164 12/14/2022   GLUCOSE 184 (H) 12/14/2022   CHOL 131 12/14/2022    TRIG 144 12/14/2022   HDL 37 (L) 12/14/2022   LDLCALC 69 12/14/2022   ALT 18 12/14/2022   AST 21 12/14/2022   NA 140 12/14/2022   K 4.6 12/14/2022   CL 102 12/14/2022   CREATININE 1.33 (H) 12/14/2022   BUN 18 12/14/2022   CO2 19 (L) 12/14/2022   TSH 4.100 05/18/2022   HGBA1C 8.2 (H) 12/14/2022   MICROALBUR 80 09/10/2021      Assessment & Plan:    Hypertensive heart disease without congestive heart failure Assessment & Plan: Well controlled.  No medicines.  Continue to work on eating a healthy diet and exercise.    Orders: -     Comprehensive metabolic panel; Future -     CBC with Differential/Platelet; Future  Mixed hyperlipidemia Assessment & Plan: Well controlled.  No changes to medicines. Atorvastatin 20 mg daily, zetia 10 mg daily Continue to work on eating a healthy diet and exercise.    Orders: -     Lipid panel; Future  Atrial fibrillation, unspecified type [facility]) Assessment & Plan: Continue eliquis.    B12 deficiency Assessment & Plan: The current medical regimen is effective;  continue present plan and medications.    Diabetic polyneuropathy associated with type 2 diabetes mellitus (HCC) Assessment & Plan: Control: not at goal Recommend check sugars fasting daily. Recommend check feet daily. Recommend annual eye exams. Medicines: Continue Novolog insulin injection 70/30 60 U in am and 40 U before supper, farxiga 10 mg daily.  Continue to work on eating a healthy diet and exercise.  Return for labs.   Orders: -     Hemoglobin A1c; Future -     Microalbumin / creatinine urine ratio     No orders of the defined types were placed in this encounter.   Orders Placed This Encounter  Procedures   Comprehensive metabolic panel   CBC with Differential/Platelet   Hemoglobin A1c   Lipid panel   Microalbumin / creatinine urine ratio     Follow-up: Return in about 1 day (around 03/08/2023) for lab visit. follow up in 3 months.   I,Marla I  Leal-Borjas,acting as a scribe for Blane Ohara, MD.,have documented all relevant documentation on the behalf of Blane Ohara, MD,as directed by  Blane Ohara, MD while in the presence of Blane Ohara, MD.   An After Visit Summary was printed and given to the patient.  Blane Ohara, MD [facility] [phone]

## 2023-03-06 NOTE — Assessment & Plan Note (Addendum)
Well controlled.  No changes to medicines. Atorvastatin 20 mg daily, zetia 10 mg daily Continue to work on eating a healthy diet and exercise.

## 2023-03-07 ENCOUNTER — Encounter: Payer: Self-pay | Admitting: Family Medicine

## 2023-03-07 ENCOUNTER — Ambulatory Visit (INDEPENDENT_AMBULATORY_CARE_PROVIDER_SITE_OTHER): Payer: PPO | Admitting: Family Medicine

## 2023-03-07 VITALS — BP 110/60 | HR 84 | Temp 97.1°F | Resp 16 | Ht 73.0 in | Wt 242.6 lb

## 2023-03-07 DIAGNOSIS — I4891 Unspecified atrial fibrillation: Secondary | ICD-10-CM

## 2023-03-07 DIAGNOSIS — E6609 Other obesity due to excess calories: Secondary | ICD-10-CM | POA: Diagnosis not present

## 2023-03-07 DIAGNOSIS — E1142 Type 2 diabetes mellitus with diabetic polyneuropathy: Secondary | ICD-10-CM | POA: Diagnosis not present

## 2023-03-07 DIAGNOSIS — E538 Deficiency of other specified B group vitamins: Secondary | ICD-10-CM | POA: Diagnosis not present

## 2023-03-07 DIAGNOSIS — E1165 Type 2 diabetes mellitus with hyperglycemia: Secondary | ICD-10-CM

## 2023-03-07 DIAGNOSIS — E782 Mixed hyperlipidemia: Secondary | ICD-10-CM

## 2023-03-07 DIAGNOSIS — I119 Hypertensive heart disease without heart failure: Secondary | ICD-10-CM | POA: Diagnosis not present

## 2023-03-07 DIAGNOSIS — Z6832 Body mass index (BMI) 32.0-32.9, adult: Secondary | ICD-10-CM

## 2023-03-08 LAB — MICROALBUMIN / CREATININE URINE RATIO
Creatinine, Urine: 64.4 mg/dL
Microalb/Creat Ratio: 16 mg/g creat (ref 0–29)
Microalbumin, Urine: 10 ug/mL

## 2023-03-11 ENCOUNTER — Other Ambulatory Visit: Payer: Self-pay | Admitting: Family Medicine

## 2023-03-12 NOTE — Assessment & Plan Note (Signed)
The current medical regimen is effective;  continue present plan and medications. Continue SL b12 2500 mg daily.

## 2023-03-12 NOTE — Assessment & Plan Note (Signed)
Rate controlled. Continue eliquis

## 2023-03-12 NOTE — Assessment & Plan Note (Signed)
Control: not at goal Recommend check sugars fasting daily. Recommend check feet daily. Recommend annual eye exams. Medicines: Continue Novolog insulin injection 70/30 60 U in am and 40 U before supper, farxiga 10 mg daily.  Continue to work on eating a healthy diet and exercise.  Return for labs.

## 2023-03-13 NOTE — Assessment & Plan Note (Signed)
Encouraged to continue exercising at cardiopulmonary rehab and eat healthy.

## 2023-03-14 ENCOUNTER — Telehealth: Payer: Self-pay

## 2023-03-14 NOTE — Progress Notes (Cosign Needed)
Care Management & Coordination Services Pharmacy Team  Reason for Encounter: Appointment Reminder  Contacted patient to confirm telephone appointment with Artelia Laroche, PharmD on 03/16/23 at 2:00 pm.  Unsuccessful outreach. Left voicemail for patient to return call.  Chart review:  Recent office visits:  03/07/23 Blane Ohara MD. Seen for follow up. No med changes.   Recent consult visits:  03/11/23 CORRIHER,MICAH H.  ([facility]). No med changes.   02/25/23 MCCORKLE,KIM N.  ([facility]). No med changes.   Hospital visits:  None   Star Rating Drugs:  Medication:  Last Fill: Day Supply Atorvastatin   02/07/23-11/08/22 90ds Farxiga   Care Gaps: Annual wellness visit in last year? No  If Diabetic: Last eye exam / retinopathy screening: Overdue since 03/11/23 Last diabetic foot exam:05/18/22   Roxana Hires, [facility] Clinical Pharmacist Assistant  [phone]

## 2023-03-15 ENCOUNTER — Ambulatory Visit: Payer: PPO | Admitting: Cardiology

## 2023-03-16 ENCOUNTER — Ambulatory Visit: Payer: PPO

## 2023-03-16 NOTE — Patient Outreach (Signed)
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 -Spoke with wife, Kathie Rhodes. 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 Loves flowers.  Recommendations/Changes made from today's visit: -Patient wants to wait (See note)   Subjective: CEFERINO BLUMENSTOCK is an 82 y.o. year old male who is a primary patient of Cox, Kirsten, MD.  The care coordination team was consulted for assistance with disease management and care coordination needs.    Engaged with patient by telephone for follow up visit.  Recent office visits:  03/07/23 Blane Ohara MD. Seen for follow up. No med changes.    Recent consult visits:  03/11/23 CORRIHER,MICAH H.  ([facility]). No med changes.    02/25/23 MCCORKLE,KIM N.  ([facility]). No med changes.    Hospital visits:  None   Objective:  Lab Results  Component Value Date   CREATININE 1.33 (H) 12/14/2022   BUN 18 12/14/2022   EGFR 53 (L) 12/14/2022   GFRNONAA >60 08/13/2022   GFRAA 75 10/06/2020   NA 140 12/14/2022   K 4.6 12/14/2022   CALCIUM 9.9 12/14/2022   CO2 19 (L) 12/14/2022   GLUCOSE 184 (H) 12/14/2022    Lab Results  Component Value Date/Time   HGBA1C 8.2 (H) 12/14/2022 08:53 AM   HGBA1C 6.8 (H) 08/13/2022 03:25 AM   MICROALBUR 80 09/10/2021 11:44 AM   MICROALBUR 150 04/16/2021 02:11 PM    Last diabetic Eye exam:  Lab Results  Component Value Date/Time   HMDIABEYEEXA No Retinopathy 03/10/2022 12:00 AM    Last diabetic Foot exam: No results found for: "HMDIABFOOTEX"   Lab Results  Component Value Date   CHOL 131 12/14/2022   HDL 37 (L) 12/14/2022   LDLCALC 69 12/14/2022   TRIG 144 12/14/2022   CHOLHDL 3.5 12/14/2022        Latest Ref Rng & Units 12/14/2022    8:53 AM 05/18/2022   10:45 AM 01/12/2022   10:55 AM  Hepatic Function  Total Protein 6.0 - 8.5 g/dL 7.0  6.4  6.8   Albumin 3.7 - 4.7 g/dL 4.3  3.9  4.2   AST 0 - 40 IU/L 21  16  16    ALT 0 - 44 IU/L 18  10  13    Alk Phosphatase 44 - 121 IU/L 128  82  104   Total Bilirubin 0.0 - 1.2 mg/dL 0.6  0.7  0.5     Lab Results  Component Value Date/Time   TSH 4.100 05/18/2022 10:45 AM   TSH 3.590 10/06/2020 01:46 PM       Latest Ref Rng & Units 12/14/2022    8:53 AM 10/18/2022   11:14 AM 08/13/2022    3:25 AM  CBC  WBC 3.4 - 10.8 x10E3/uL 7.6  6.0  8.7   Hemoglobin 13.0 - 17.7 g/dL 40.9  81.1  91.4   Hematocrit 37.5 - 51.0 % 48.4  48.3  36.0   Platelets 150 - 450 x10E3/uL 164  165  114     Lab Results  Component Value Date/Time   VITAMINB12 300 05/18/2022 10:45 AM   VITAMINB12 920 10/06/2020 01:46 PM    Clinical ASCVD: Yes  The ASCVD Risk score (Arnett DK, et al., 2019) failed to calculate for the following reasons:   The 2019 ASCVD risk score is only valid for ages 84 to 81   The patient has a prior MI or stroke diagnosis    Other: (CHADS2VASc if Afib, MMRC or CAT for COPD, ACT, DEXA)     12/02/2022   10:49 AM 09/10/2022   11:36 AM 05/18/2022    9:30 AM  Depression screen PHQ 2/9  Decreased Interest 0 0 0  Down, Depressed, Hopeless 0 0 0  PHQ - 2 Score 0 0 0     Social History   Tobacco Use  Smoking Status Former   Packs/day: 1.00   Years: 20.00   Additional pack years: 0.00   Total pack years: 20.00   Types: Cigarettes   Quit date: 10/19/1975   Years since quitting: 47.4  Smokeless Tobacco Never  Tobacco Comments   Former smoker 11/11/22   BP Readings from Last 3 Encounters:  03/07/23 110/60  01/21/23 120/68  12/23/22 110/72   Pulse Readings from Last 3 Encounters:  03/07/23 84  01/21/23 78  12/23/22 83   Wt Readings from Last 3 Encounters:  03/07/23 242 lb 9.6 oz (110 kg)  01/21/23 247 lb 3.2 oz (112.1  kg)  12/23/22 251 lb 6.4 oz (114 kg)   BMI Readings from Last 3 Encounters:  03/07/23 32.01 kg/m  01/21/23 31.74 kg/m  12/23/22 32.28 kg/m    Allergies  Allergen Reactions   Nitroglycerin Other (See Comments)    Blood pressure 'bottomed out'   Statins Other (See Comments)    Elevation of liver enzymes   Tetanus Toxoid Other (See Comments)    Severe flu symptoms fever and chills   Penicillins Rash    Medications Reviewed Today     Reviewed by Blane Ohara, MD (Physician) on 03/07/23 at 0914  Med List Status: <None>   Medication Order Taking? Sig Documenting Provider Last Dose Status Informant  acetaminophen (TYLENOL) 500 MG tablet [id number] Yes Take 500-1,000 mg by mouth every 6 (six) hours as needed (pain.). [provider] Taking Active Spouse/Significant Other  apixaban (ELIQUIS) 5 MG TABS tablet [id number] Yes TAKE 1 TABLET BY MOUTH TWICE DAILY. Perlie Gold, PA-C Taking Active   atorvastatin (LIPITOR) 20 MG tablet [id number] Yes TAKE 1 TABLET BY MOUTH DAILY. Cox, Kirsten, MD Taking Active   Blood Glucose Monitoring Suppl [facility] VERIO) w/Device Andria Rhein [id number] Yes Use as instructed. Check blood glucose twice daily. E11.42 Blane Ohara, MD Taking Active Spouse/Significant Other  clopidogrel (PLAVIX) 75 MG tablet [id number] Yes TAKE 1 TABLET BY MOUTH EVERY DAY WITH BREAKFAST Wendall Stade, MD Taking Active   Cyanocobalamin 1000 MCG/ML LIQD [id number] Yes Place 2,500 mcg under the tongue in the morning. Place 0.5 dropperful (2,500 mcg) sublingually daily [provider] Taking Active Spouse/Significant Other  dapagliflozin propanediol (FARXIGA) 10 MG TABS tablet [id number] Yes Take 1 tablet (10 mg total) by mouth daily. Cox, Kirsten, MD Taking Active   ezetimibe (ZETIA) 10 MG tablet [id number] Yes Take 1 tablet (10 mg total) by mouth daily. Cox, Kirsten, MD Taking Active   gabapentin (NEURONTIN) 300 MG capsule [id number] Yes TAKE 1 CAPSULE BY MOUTH 2 TIMES DAILY.  Cox, Fritzi Mandes, MD Taking Active   glucose blood test strip [id number] Yes  [provider] Taking Active Spouse/Significant Other  insulin aspart protamine- aspart (NOVOLOG MIX 70/30) (70-30) 100 UNIT/ML injection [id number] Yes Inject 0.4-0.6 mLs (40-60 Units total) into the skin See  admin instructions. Inject 60 units into the skin before breakfast and 40 units units before supper/evening meal Cox, Kirsten, MD Taking Active Spouse/Significant Other  Insulin Pen Needle (NOVOFINE) 10 each [id number]   Blane Ohara, MD  Active   Insulin Pen Needle (NOVOFINE) 30G X 8 MM MISC [id number] Yes Inject 10 each into the skin as needed. E11.42 Blane Ohara, MD Taking Active Spouse/Significant Other  Lancets [facility] ULTRASOFT) lancets [id number] Yes Use as instructed, Check blood glucose twice daily. Cox, Kirsten, MD Taking Active Spouse/Significant Other  Multiple Vitamins-Minerals (PRESERVISION AREDS PO) [id number] Yes Take 1 capsule by mouth 2 (two) times daily. [provider] Taking Active Spouse/Significant Other  ONETOUCH VERIO test strip [id number] Yes CHECK BLOOD SUGAR TWICE DAILY. Cox, Fritzi Mandes, MD Taking Active             SDOH:  (Social Determinants of Health) assessments and interventions performed: Yes SDOH Interventions    Flowsheet Row Chronic Care Management from 09/15/2022 in [facility] Chronic Care Management from 09/10/2022 in [facility] Telephone from 08/16/2022 in [facility] Coordination Office Visit from 10/06/2020 in [facility]  SDOH Interventions      Food Insecurity Interventions -- Intervention Not Indicated Intervention Not Indicated --  Housing Interventions -- Intervention Not Indicated Intervention Not Indicated --  Transportation Interventions Intervention Not Indicated Intervention Not Indicated Intervention Not Indicated --  Utilities Interventions -- Intervention Not  Indicated -- --  Alcohol Usage Interventions -- Intervention Not Indicated (Score <7) -- --  Depression Interventions/Treatment  -- -- -- PHQ2-9 Score <4 Follow-up Not Indicated  Financial Strain Interventions Other (Comment)  Marcelline Deist PAP] Intervention Not Indicated Intervention Not Indicated --  Physical Activity Interventions -- Intervention Not Indicated, Cardiac Rehab -- --  Stress Interventions -- Intervention Not Indicated -- --  Social Connections Interventions -- Intervention Not Indicated -- --       Medication Assistance:   Farxiga  -2024: PAP Approved   Name and location of Current pharmacy:  [facility] - [place], [place] - [address] [place] [address] Phone: [phone] Fax: [phone]   Compliance/Adherence/Medication fill history: Star Rating Drugs:  Medication:                Last Fill:         Day Supply Atorvastatin                 02/07/23-11/08/22 90ds Farxiga     Care Gaps: Annual wellness visit in last year? No   If Diabetic: Last eye exam / retinopathy screening: Overdue since 03/11/23 Last diabetic foot exam:05/18/22   Assessment/Plan  CAD: (LDL goal < 70) -S/p Remote Inf MI and CABG x 4 in 1997 -NSTEMI 08/2022 s/p 3 x 16 mm Synergy DES to S-OM The ASCVD Risk score (Arnett DK, et al., 2019) failed to calculate for the following reasons:   The 2019 ASCVD risk score is only valid for ages 54 to 37   The patient has a prior MI or stroke diagnosis Lab Results  Component Value Date   CHOL 131 12/14/2022   CHOL 91 (L) 05/18/2022   CHOL 159 01/12/2022   Lab Results  Component Value Date   HDL 37 (L) 12/14/2022   HDL 31 (L) 05/18/2022   HDL 35 (L) 01/12/2022   Lab Results  Component Value Date   LDLCALC 69 12/14/2022   LDLCALC 46 05/18/2022  LDLCALC 106 (H) 01/12/2022   Lab Results  Component Value Date   TRIG 144 12/14/2022   TRIG 63 05/18/2022   TRIG 98 01/12/2022   Lab Results  Component  Value Date   CHOLHDL 3.5 12/14/2022   CHOLHDL 2.9 05/18/2022   CHOLHDL 4.5 01/12/2022   No results found for: "LDLDIRECT" Last vitamin D No results found for: "25OHVITD2", "25OHVITD3", "VD25OH" Lab Results  Component Value Date   TSH 4.100 05/18/2022  -Controlled -Current treatment: Atorvastatin 20mg  Appropriate, Effective, Safe, Accessible Clopidogrel 75mg  Appropriate, Effective, Safe, Accessible -Medications previously tried: Atorvastatin high intensity, ASA (Stopped DAPT on 09/12/22) -Current dietary patterns: "Tries to eat healthy" -Current exercise habits: Cardiac Rehab weekly -Educated on Cholesterol goals;  -Recommended to continue current medication   Diabetes (A1c goal <7%) Lab Results  Component Value Date   HGBA1C 8.2 (H) 12/14/2022   HGBA1C 6.8 (H) 08/13/2022   HGBA1C 7.5 (H) 05/18/2022   Lab Results  Component Value Date   MICROALBUR 80 09/10/2021   LDLCALC 69 12/14/2022   CREATININE 1.33 (H) 12/14/2022    Lab Results  Component Value Date   NA 140 12/14/2022   K 4.6 12/14/2022   CREATININE 1.33 (H) 12/14/2022   EGFR 53 (L) 12/14/2022   GFRNONAA >60 08/13/2022   GLUCOSE 184 (H) 12/14/2022    Lab Results  Component Value Date   WBC 7.6 12/14/2022   HGB 16.3 12/14/2022   HCT 48.4 12/14/2022   MCV 96 12/14/2022   PLT 164 12/14/2022    Lab Results  Component Value Date   LABMICR 10.0 03/07/2023   LABMICR 55.8 01/12/2022   MICROALBUR 80 09/10/2021   MICROALBUR 150 04/16/2021  -Controlled -Current medications: Farxiga 5mg  Appropriate, Effective, Safe, Accessible 2024: PAP Approved Inslin Aspart 60-40 units Appropriate, Effective, Safe, Accessible Gets from [place] -Medications previously tried: Metformin (Was on IR max dose for years but bothered stomach. Took with food)  -Current home glucose readings fasting glucose:  November 2023:  Went in 50's at Rehab on 09/12/22 09/10/22: 95 09/11/22: 86  09/12/22: 102 09/13/22: 77 09/14/22:  Forgot 09/15/22:76 May 2024: (morning) 144 117 129 154 142 No more lows -Reports hypoglycemic/hyperglycemic symptoms -Current exercise: Cardiac rehab twice a week -Educated on A1c and blood sugar goals; -Counseled to check feet daily and get yearly eye exams November 2023: Farxiga PAP. Counseled to call company to check on status 2 weeks after she brings in. Patient and wife make about $50,000/year May 2024: Sugars elevated but patient hasn't gotten A1c. They want to wait until A1c done to change anything   Atrial Fibrillation (Goal: prevent stroke and major bleeding) -S/p Remote Inf MI and CABG x 4 in 1997 -NSTEMI 08/2022 s/p 3 x 16 mm Synergy DES to S-OM -Echocardiogram in 2015: EF 45-50, inf HK -Echo 10/23: EF 55-60 -Controlled -CHADSVASC: 5 -Current treatment: Rate control:  N/A Anticoagulation:  Apixaban 5mg  Appropriate, Effective, Safe, Accessible Getting through [place] 82 y.o. Lab Results  Component Value Date   CREATININE 1.33 (H) 12/14/2022   Wt Readings from Last 3 Encounters:  03/07/23 242 lb 9.6 oz (110 kg)  01/21/23 247 lb 3.2 oz (112.1 kg)  12/23/22 251 lb 6.4 oz (114 kg)  -Medications previously tried: Metoprolol, Telmisartan -Home BP and HR readings:  May 2024: 123/84 115/73 127/72 126/72 -Counseled on increased risk of stroke due to Afib and benefits of anticoagulation for stroke prevention; November 2023: They are worried about cost of Apixaban. Explained DH to them and they'll try to  get through the [place]    Cp F/U depending on abs  Artelia Laroche, [place].D. - [phone]

## 2023-03-22 ENCOUNTER — Other Ambulatory Visit: Payer: Self-pay | Admitting: Family Medicine

## 2023-03-22 ENCOUNTER — Ambulatory Visit: Payer: PPO | Attending: Cardiology | Admitting: Cardiovascular Disease

## 2023-03-22 ENCOUNTER — Encounter: Payer: Self-pay | Admitting: Cardiovascular Disease

## 2023-03-22 VITALS — BP 124/76 | HR 88 | Ht 73.0 in | Wt 243.0 lb

## 2023-03-22 DIAGNOSIS — I4891 Unspecified atrial fibrillation: Secondary | ICD-10-CM | POA: Diagnosis not present

## 2023-03-22 DIAGNOSIS — E1142 Type 2 diabetes mellitus with diabetic polyneuropathy: Secondary | ICD-10-CM | POA: Diagnosis not present

## 2023-03-22 DIAGNOSIS — I119 Hypertensive heart disease without heart failure: Secondary | ICD-10-CM | POA: Diagnosis not present

## 2023-03-22 DIAGNOSIS — E782 Mixed hyperlipidemia: Secondary | ICD-10-CM | POA: Diagnosis not present

## 2023-03-22 NOTE — Progress Notes (Signed)
Electrophysiology Office Note:    Date:  03/22/2023   ID:  Andre Shaffer, DOB Feb 20, 1941, MRN [id number]  PCP:  Blane Ohara, MD   [facility] Providers Cardiologist:  Charlton Haws, MD     Referring MD: Wendall Stade, MD   History of Present Illness:    Andre Shaffer is a 82 y.o. male with a hx listed below, significant for CAD status post inferior wall MI, coronary artery bypass in 1997, stenting of vein graft, ischemic cardiomyopathy, hypertension, diabetes type 2, paroxysmal atrial fibrillation, referred for arrhythmia management.  He was diagnosed with atrial fibrillation in October 2023.  He underwent DC cardioversion in December 2023.  He followed up in A-fib clinic a month later due to subjective irregular heartbeat which turned out to be sinus rhythm with frequent PACs.  Rate control medications have been limited by hypotension and orthostasis during cardiac rehab.  He has been maintaining sinus rhythm since his recent cardioversion.  He reports that he has never been aware of his AF. He did not notice any change in energy levels after his cardioversion.  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 // NSTEMI 08/2022 >> s/p DES to S-OM [L-LAD patent, S-Dx patent, S-RCA CTO] // Echo 2015: EF 45-50, inf HK // Echo 10/23: EF 55-60, no WMA   Diabetes mellitus without complication (HCC)    GERD (gastroesophageal reflux disease)    Hyperlipemia    Hypertension    MI (myocardial infarction) (HCC)    SCC (squamous cell carcinoma), arm, left    SCC (squamous cell carcinoma), arm, 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    Past Surgical History:  Procedure Laterality Date   ANKLE SURGERY  93,03   rt and lt    CARDIOVERSION N/A 10/20/2022   Procedure: CARDIOVERSION;  Surgeon: Wendall Stade, MD;  Location: [facility];  Service: Cardiovascular;   Laterality: N/A;   CHOLECYSTECTOMY  10/19/2012   Procedure: LAPAROSCOPIC CHOLECYSTECTOMY WITH INTRAOPERATIVE CHOLANGIOGRAM;  Surgeon: Axel Filler, MD;  Location: [facility];  Service: General;  Laterality: N/A;   COLONOSCOPY     CORONARY ARTERY BYPASS GRAFT  1997   CORONARY STENT INTERVENTION N/A 08/12/2022   Procedure: CORONARY STENT INTERVENTION;  Surgeon: Kathleene Hazel, MD;  Location: [facility];  Service: Cardiovascular;  Laterality: N/A;   ENDOSCOPIC RETROGRADE CHOLANGIOPANCREATOGRAPHY (ERCP) WITH PROPOFOL N/A 07/16/2021   Procedure: ENDOSCOPIC RETROGRADE CHOLANGIOPANCREATOGRAPHY (ERCP) WITH PROPOFOL;  Surgeon: Rachael Fee, MD;  Location: [facility];  Service: Endoscopy;  Laterality: N/A;   LEFT HEART CATH AND CORS/GRAFTS ANGIOGRAPHY N/A 08/12/2022   Procedure: LEFT HEART CATH AND CORS/GRAFTS ANGIOGRAPHY;  Surgeon: Kathleene Hazel, MD;  Location: [facility];  Service: Cardiovascular;  Laterality: N/A;   REMOVAL OF STONES  07/16/2021   Procedure: REMOVAL OF STONES;  Surgeon: Rachael Fee, MD;  Location: [facility];  Service: Endoscopy;;   SKIN CANCER EXCISION  12/2020   Squamous Cell Cancer   SPHINCTEROTOMY  07/16/2021   Procedure: SPHINCTEROTOMY;  Surgeon: Rachael Fee, MD;  Location: [facility];  Service: Endoscopy;;   TIBIA FRACTURE SURGERY      Current Medications: Current Meds  Medication Sig   acetaminophen (TYLENOL) 500 MG tablet Take 500-1,000 mg by mouth every 6 (six) hours as needed (pain.).  apixaban (ELIQUIS) 5 MG TABS tablet TAKE 1 TABLET BY MOUTH TWICE DAILY.   atorvastatin (LIPITOR) 20 MG tablet TAKE 1 TABLET BY MOUTH DAILY.   Blood Glucose Monitoring Suppl (ONETOUCH VERIO) w/Device KIT Use as instructed. Check blood glucose twice daily. E11.42   clopidogrel (PLAVIX) 75 MG tablet TAKE 1 TABLET BY MOUTH EVERY DAY WITH BREAKFAST   Cyanocobalamin 1000 MCG/ML LIQD Place 2,500 mcg under the tongue in the  morning. Place 0.5 dropperful (2,500 mcg) sublingually daily   dapagliflozin propanediol (FARXIGA) 10 MG TABS tablet Take 1 tablet (10 mg total) by mouth daily.   ezetimibe (ZETIA) 10 MG tablet TAKE 1 TABLET BY MOUTH DAILY.   gabapentin (NEURONTIN) 300 MG capsule TAKE 1 CAPSULE BY MOUTH 2 TIMES DAILY.   glucose blood test strip    insulin aspart protamine- aspart (NOVOLOG MIX 70/30) (70-30) 100 UNIT/ML injection Inject 0.4-0.6 mLs (40-60 Units total) into the skin See admin instructions. Inject 60 units into the skin before breakfast and 40 units units before supper/evening meal   Insulin Pen Needle (NOVOFINE) 30G X 8 MM MISC Inject 10 each into the skin as needed. E11.42   Lancets (ONETOUCH ULTRASOFT) lancets Use as instructed, Check blood glucose twice daily.   Multiple Vitamins-Minerals (PRESERVISION AREDS PO) Take 1 capsule by mouth 2 (two) times daily.   ONETOUCH VERIO test strip CHECK BLOOD SUGAR TWICE DAILY.   Current Facility-Administered Medications for the 03/22/23 encounter (Office Visit) with Nailyn Dearinger, Roberts Gaudy, MD  Medication   Insulin Pen Needle (NOVOFINE) 10 each     Allergies:   Nitroglycerin, Statins, Tetanus toxoid, and Penicillins   Social and Family History: Reviewed in Epic  ROS:   Please see the history of present illness.    All other systems reviewed and are negative.  EKGs/Labs/Other Studies Reviewed Today:    Echocardiogram:  TTE 08/2022  EF 55-60%, LA moderately dilated  Monitors:   Stress testing:   Advanced imaging:   Cardiac catherization   EKG:  Last EKG results: today - sinus rhythm with frequent PACs   Recent Labs: 05/18/2022: TSH 4.100 12/14/2022: ALT 18; BUN 18; Creatinine, Ser 1.33; Hemoglobin 16.3; Platelets 164; Potassium 4.6; Sodium 140     Physical Exam:    VS:  BP 124/76   Pulse 88   Ht 6\' 1"  (1.854 m)   Wt 243 lb (110.2 kg)   SpO2 94%   BMI 32.06 kg/m     Wt Readings from Last 3 Encounters:  03/22/23 243 lb (110.2  kg)  03/07/23 242 lb 9.6 oz (110 kg)  01/21/23 247 lb 3.2 oz (112.1 kg)     GEN:  Well nourished, well developed in no acute distress CARDIAC: irregular rhythm, no murmurs, rubs, gallops RESPIRATORY:  Normal work of breathing MUSCULOSKELETAL: no edema    ASSESSMENT & PLAN:    Atrial fibrillation - persistent Asymptomatic, rates are controlled based on prior ECG and wife's home measurements I do not see a strong indication for rhythm control at this time and would favor reducing polypharmacy in advanced age  Secondary hypercoagulable state Continue apixaban 5mg  PO BID  Hypertension BP well controlled today Has had issues with orthostatic hypotension in the past No changes today  CAD On lipitor 20  Frequent PACs May cause an irregular rhythm on exam No intervention necessary.          Medication Adjustments/Labs and Tests Ordered: Current medicines are reviewed at length with the patient today.  Concerns regarding medicines are outlined  above.  No orders of the defined types were placed in this encounter.  No orders of the defined types were placed in this encounter.    Signed, Maurice Small, MD  03/22/2023 11:29 AM    [facility]

## 2023-03-22 NOTE — Patient Instructions (Signed)
Medication Instructions:  Your physician recommends that you continue on your current medications as directed. Please refer to the Current Medication list given to you today. *If you need a refill on your cardiac medications before your next appointment, please call your pharmacy*   Follow-Up: At [facility], you and your health needs are our priority.  As part of our continuing mission to provide you with exceptional heart care, we have created designated Provider Care Teams.  These Care Teams include your primary Cardiologist (physician) and Advanced Practice Providers (APPs -  Physician Assistants and Nurse Practitioners) who all work together to provide you with the care you need, when you need it.  We recommend signing up for the patient portal called "MyChart".  Sign up information is provided on this After Visit Summary.  MyChart is used to connect with patients for Virtual Visits (Telemedicine).  Patients are able to view lab/test results, encounter notes, upcoming appointments, etc.  Non-urgent messages can be sent to your provider as well.   To learn more about what you can do with MyChart, go to [email or web address].    Your next appointment:   6 month(s)  Provider:   You will follow up in the Atrial Fibrillation Clinic located at [facility]. Your provider will be: Nelva Bush or [employer]

## 2023-03-23 LAB — CBC WITH DIFFERENTIAL/PLATELET
Basophils Absolute: 0 10*3/uL (ref 0.0–0.2)
Basos: 1 %
EOS (ABSOLUTE): 0.3 10*3/uL (ref 0.0–0.4)
Eos: 4 %
Hematocrit: 50.4 % (ref 37.5–51.0)
Hemoglobin: 16.9 g/dL (ref 13.0–17.7)
Immature Grans (Abs): 0 10*3/uL (ref 0.0–0.1)
Immature Granulocytes: 0 %
Lymphocytes Absolute: 1.2 10*3/uL (ref 0.7–3.1)
Lymphs: 16 %
MCH: 32 pg (ref 26.6–33.0)
MCHC: 33.5 g/dL (ref 31.5–35.7)
MCV: 96 fL (ref 79–97)
Monocytes Absolute: 0.5 10*3/uL (ref 0.1–0.9)
Monocytes: 6 %
Neutrophils Absolute: 5.3 10*3/uL (ref 1.4–7.0)
Neutrophils: 73 %
Platelets: 146 10*3/uL — ABNORMAL LOW (ref 150–450)
RBC: 5.28 x10E6/uL (ref 4.14–5.80)
RDW: 12.7 % (ref 11.6–15.4)
WBC: 7.3 10*3/uL (ref 3.4–10.8)

## 2023-03-23 LAB — COMPREHENSIVE METABOLIC PANEL
ALT: 17 IU/L (ref 0–44)
AST: 19 IU/L (ref 0–40)
Albumin/Globulin Ratio: 1.6 (ref 1.2–2.2)
Albumin: 4.2 g/dL (ref 3.7–4.7)
Alkaline Phosphatase: 122 IU/L — ABNORMAL HIGH (ref 44–121)
BUN/Creatinine Ratio: 15 (ref 10–24)
BUN: 20 mg/dL (ref 8–27)
Bilirubin Total: 0.9 mg/dL (ref 0.0–1.2)
CO2: 22 mmol/L (ref 20–29)
Calcium: 9.9 mg/dL (ref 8.6–10.2)
Chloride: 104 mmol/L (ref 96–106)
Creatinine, Ser: 1.31 mg/dL — ABNORMAL HIGH (ref 0.76–1.27)
Globulin, Total: 2.6 g/dL (ref 1.5–4.5)
Glucose: 172 mg/dL — ABNORMAL HIGH (ref 70–99)
Potassium: 5.5 mmol/L — ABNORMAL HIGH (ref 3.5–5.2)
Sodium: 140 mmol/L (ref 134–144)
Total Protein: 6.8 g/dL (ref 6.0–8.5)
eGFR: 54 mL/min/{1.73_m2} — ABNORMAL LOW (ref >59)

## 2023-03-23 LAB — LIPID PANEL
Chol/HDL Ratio: 2.5 ratio (ref 0.0–5.0)
Cholesterol, Total: 84 mg/dL — ABNORMAL LOW (ref 100–199)
HDL: 33 mg/dL — ABNORMAL LOW (ref >39)
LDL Chol Calc (NIH): 29 mg/dL (ref 0–99)
Triglycerides: 119 mg/dL (ref 0–149)
VLDL Cholesterol Cal: 22 mg/dL (ref 5–40)

## 2023-03-23 LAB — CARDIOVASCULAR RISK ASSESSMENT

## 2023-03-23 LAB — HEMOGLOBIN A1C
Est. average glucose Bld gHb Est-mCnc: 189 mg/dL
Hgb A1c MFr Bld: 8.2 % — ABNORMAL HIGH (ref 4.8–5.6)

## 2023-03-29 NOTE — Patient Outreach (Signed)
Tried calling patient, he was driving. Will call later

## 2023-03-30 ENCOUNTER — Telehealth: Payer: Self-pay

## 2023-03-30 NOTE — Telephone Encounter (Signed)
Patient stated he was not in the mood to talk today. Stated he doesn't want to waste [facility]'s money. Stated he didn't want to get ugly then hung up on me  Will defer monthly call for a month

## 2023-05-18 LAB — HM DIABETES EYE EXAM

## 2023-06-06 ENCOUNTER — Other Ambulatory Visit: Payer: Self-pay | Admitting: Family Medicine

## 2023-06-08 ENCOUNTER — Telehealth: Payer: Self-pay | Admitting: Family Medicine

## 2023-06-08 NOTE — Telephone Encounter (Signed)
numbness in his left hand - ongoing weeks - no chest pains, no shob    -- appt made for tomorrow at 11:40 I told pt wife that nurse may call her with more questions due to his sxs.   - please advise, ty.

## 2023-06-09 ENCOUNTER — Ambulatory Visit: Payer: PPO | Admitting: Family Medicine

## 2023-06-09 VITALS — BP 110/60 | HR 72 | Temp 97.0°F | Resp 16 | Ht 73.0 in | Wt 242.0 lb

## 2023-06-09 DIAGNOSIS — M5412 Radiculopathy, cervical region: Secondary | ICD-10-CM

## 2023-06-09 DIAGNOSIS — G609 Hereditary and idiopathic neuropathy, unspecified: Secondary | ICD-10-CM | POA: Diagnosis not present

## 2023-06-09 NOTE — Patient Instructions (Signed)
Increase gabapentin to 300 mg three times a day.   Order mri of cervical spine.

## 2023-06-09 NOTE — Progress Notes (Unsigned)
Acute Office Visit  Subjective:    Patient ID: Andre Shaffer, male    DOB: Apr 25, 1941, 82 y.o.   MRN: [id number]  Chief Complaint  Patient presents with   Numbness   HPI: Patient is in today with numbness of his left hand which started about 3 weeks ago.  He was seen at the [facility] and had xrays of the cervical spine and a CT scan has been scheduled for September.  It is very painful.   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 // NSTEMI 08/2022 >> s/p DES to S-OM [L-LAD patent, S-Dx patent, S-RCA CTO] // Echo 2015: EF 45-50, inf HK // Echo 10/23: EF 55-60, no WMA   Diabetes mellitus without complication (HCC)    GERD (gastroesophageal reflux disease)    Hyperlipemia    Hypertension    MI (myocardial infarction) (HCC)    SCC (squamous cell carcinoma), arm, left    SCC (squamous cell carcinoma), arm, 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    Past Surgical History:  Procedure Laterality Date   ANKLE SURGERY  93,03   rt and lt    CARDIOVERSION N/A 10/20/2022   Procedure: CARDIOVERSION;  Surgeon: Wendall Stade, MD;  [facility];  Service: Cardiovascular;  Laterality: N/A;   CHOLECYSTECTOMY  10/19/2012   Procedure: LAPAROSCOPIC CHOLECYSTECTOMY WITH INTRAOPERATIVE CHOLANGIOGRAM;  Surgeon: Axel Filler, MD;  Location: [facility];  Service: General;  Laterality: N/A;   COLONOSCOPY     CORONARY ARTERY BYPASS GRAFT  1997   CORONARY STENT INTERVENTION N/A 08/12/2022   Procedure: CORONARY STENT INTERVENTION;  Surgeon: Kathleene Hazel, MD;  [facility];  Service: Cardiovascular;  Laterality: N/A;   ENDOSCOPIC RETROGRADE CHOLANGIOPANCREATOGRAPHY (ERCP) WITH PROPOFOL N/A 07/16/2021   Procedure: ENDOSCOPIC RETROGRADE CHOLANGIOPANCREATOGRAPHY (ERCP) WITH PROPOFOL;  Surgeon: Rachael Fee, MD;  [facility];   Service: Endoscopy;  Laterality: N/A;   LEFT HEART CATH AND CORS/GRAFTS ANGIOGRAPHY N/A 08/12/2022   Procedure: LEFT HEART CATH AND CORS/GRAFTS ANGIOGRAPHY;  Surgeon: Kathleene Hazel, MD;  [facility];  Service: Cardiovascular;  Laterality: N/A;   REMOVAL OF STONES  07/16/2021   Procedure: REMOVAL OF STONES;  Surgeon: Rachael Fee, MD;  [facility]: [facility];  Service: Endoscopy;;   SKIN CANCER EXCISION  12/2020   Squamous Cell Cancer   SPHINCTEROTOMY  07/16/2021   Procedure: SPHINCTEROTOMY;  Surgeon: Rachael Fee, MD;  [facility];  Service: Endoscopy;;   TIBIA FRACTURE SURGERY      History reviewed. No pertinent family history.  Social History   Socioeconomic History   Marital status: Married    Spouse name: Not on file   Number of children: Not on file   Years of education: Not on file   Highest education level: Not on file  Occupational History   Not on file  Tobacco Use   Smoking status: Former    Current packs/day: 0.00    Average packs/day: 1 pack/day for 20.0 years (20.0 ttl pk-yrs)    Types: Cigarettes    Start date: 10/19/1955    Quit date: 10/19/1975    Years since quitting: 47.6   Smokeless tobacco: Never   Tobacco comments:    Former smoker 11/11/22  Substance and Sexual Activity   Alcohol use: No  Drug use: No   Sexual activity: Not on file  Other Topics Concern   Not on file  Social History Narrative   Not on file   Social Determinants of Health   Financial Resource Strain: High Risk (09/15/2022)   Overall Financial Resource Strain (CARDIA)    Difficulty of Paying Living Expenses: Hard  Food Insecurity: No Food Insecurity (09/10/2022)   Hunger Vital Sign    Worried About Running Out of Food in the Last Year: Never true    Ran Out of Food in the Last Year: Never true  Transportation Needs: No Transportation Needs (09/15/2022)   PRAPARE - Administrator, Civil Service (Medical): No    Lack of  Transportation (Non-Medical): No  Physical Activity: Sufficiently Active (09/10/2022)   Exercise Vital Sign    Days of Exercise per Week: 4 days    Minutes of Exercise per Session: 90 min  Stress: No Stress Concern Present (09/10/2022)   [employer] of Occupational Health - Occupational Stress Questionnaire    Feeling of Stress : Not at all  Social Connections: Socially Integrated (09/10/2022)   Social Connection and Isolation Panel [NHANES]    Frequency of Communication with Friends and Family: More than three times a week    Frequency of Social Gatherings with Friends and Family: More than three times a week    Attends Religious Services: More than 4 times per year    Active Member of [employer] or Organizations: Yes    Attends Banker Meetings: 1 to 4 times per year    Marital Status: Married  Catering manager Violence: Not At Risk (09/10/2022)   Humiliation, Afraid, Rape, and Kick questionnaire    Fear of Current or Ex-Partner: No    Emotionally Abused: No    Physically Abused: No    Sexually Abused: No    Outpatient Medications Prior to Visit  Medication Sig Dispense Refill   acetaminophen (TYLENOL) 500 MG tablet Take 500-1,000 mg by mouth every 6 (six) hours as needed (pain.).     apixaban (ELIQUIS) 5 MG TABS tablet TAKE 1 TABLET BY MOUTH TWICE DAILY. 60 tablet 5   atorvastatin (LIPITOR) 20 MG tablet TAKE 1 TABLET BY MOUTH DAILY. 90 tablet 2   Blood Glucose Monitoring Suppl (ONETOUCH VERIO) w/Device KIT Use as instructed. Check blood glucose twice daily. E11.42 1 kit 0   clopidogrel (PLAVIX) 75 MG tablet TAKE 1 TABLET BY MOUTH EVERY DAY WITH BREAKFAST 30 tablet 6   Cyanocobalamin 1000 MCG/ML LIQD Place 2,500 mcg under the tongue in the morning. Place 0.5 dropperful (2,500 mcg) sublingually daily     dapagliflozin propanediol (FARXIGA) 10 MG TABS tablet Take 1 tablet (10 mg total) by mouth daily. 90 tablet 3   ezetimibe (ZETIA) 10 MG tablet TAKE 1 TABLET BY MOUTH  DAILY. 90 tablet 0   gabapentin (NEURONTIN) 300 MG capsule TAKE 1 CAPSULE BY MOUTH 2 TIMES DAILY. 180 capsule 2   glucose blood test strip      insulin aspart protamine- aspart (NOVOLOG MIX 70/30) (70-30) 100 UNIT/ML injection Inject 0.4-0.6 mLs (40-60 Units total) into the skin See admin instructions. Inject 60 units into the skin before breakfast and 40 units units before supper/evening meal 10 mL 0   Insulin Pen Needle (NOVOFINE) 30G X 8 MM MISC Inject 10 each into the skin as needed. E11.42 100 each 2   Lancets (ONETOUCH ULTRASOFT) lancets Use as instructed, Check blood glucose twice daily. 100 each 12  Multiple Vitamins-Minerals (PRESERVISION AREDS PO) Take 1 capsule by mouth 2 (two) times daily.     ONETOUCH VERIO test strip CHECK BLOOD SUGAR TWICE DAILY. 100 each 11   Facility-Administered Medications Prior to Visit  Medication Dose Route Frequency Provider Last Rate Last Admin   Insulin Pen Needle (NOVOFINE) 10 each  1 packet Subcutaneous PRN , , MD        Allergies  Allergen Reactions   Nitroglycerin Other (See Comments)    Blood pressure 'bottomed out'   Statins Other (See Comments)    Elevation of liver enzymes   Tetanus Toxoid Other (See Comments)    Severe flu symptoms fever and chills   Penicillins Rash    Review of Systems  Constitutional:  Positive for fatigue. Negative for chills and fever.  HENT:  Negative for congestion, rhinorrhea and sore throat.   Respiratory:  Negative for cough and shortness of breath.   Cardiovascular:  Negative for chest pain and palpitations.  Gastrointestinal:  Negative for abdominal pain, constipation, diarrhea, nausea and vomiting.  Genitourinary:  Negative for dysuria and urgency.  Musculoskeletal:  Negative for arthralgias, back pain and myalgias.  Neurological:  Positive for numbness (left hand). Negative for dizziness and headaches.  Psychiatric/Behavioral:  Negative for dysphoric mood. The patient is not nervous/anxious.         Objective:        06/09/2023   11:42 AM 03/22/2023   11:13 AM 03/07/2023    8:38 AM  Vitals with BMI  Height 6\' 1"  6\' 1"  6\' 1"   Weight 242 lbs 243 lbs 242 lbs 10 oz  BMI 31.93 32.07 32.01  Systolic 110 124 161  Diastolic 60 76 60  Pulse 72 88 84    No data found.   Physical Exam Vitals reviewed.  Constitutional:      Appearance: Normal appearance. He is obese.  Neck:     Vascular: No carotid bruit.  Cardiovascular:     Rate and Rhythm: Normal rate and regular rhythm.     Pulses: Normal pulses.     Heart sounds: Normal heart sounds.  Pulmonary:     Effort: Pulmonary effort is normal.     Breath sounds: Normal breath sounds. No wheezing, rhonchi or rales.  Musculoskeletal:        General: Tenderness (left trapezius muscle. discomfort in hands specifically 3rd, 4th, and 5th left hand. rt hand normal.) present.  Neurological:     Mental Status: He is alert and oriented to person, place, and time.  Psychiatric:        Mood and Affect: Mood normal.        Behavior: Behavior normal.     Health Maintenance Due  Topic Date Due   Zoster Vaccines- Shingrix (1 of 2) Never done   Pneumonia Vaccine 73+ Years old (2 of 2 - PCV) 08/02/2015   Medicare Annual Wellness (AWV)  06/01/2017   COVID-19 Vaccine (5 - 2023-24 season) 07/02/2022   OPHTHALMOLOGY EXAM  03/11/2023   FOOT EXAM  05/19/2023   INFLUENZA VACCINE  06/02/2023    There are no preventive care reminders to display for this patient.   Lab Results  Component Value Date   TSH 4.100 05/18/2022   Lab Results  Component Value Date   WBC 7.3 03/22/2023   HGB 16.9 03/22/2023   HCT 50.4 03/22/2023   MCV 96 03/22/2023   PLT 146 (L) 03/22/2023   Lab Results  Component Value Date   NA 140 03/22/2023  K 5.5 (H) 03/22/2023   CO2 22 03/22/2023   GLUCOSE 172 (H) 03/22/2023   BUN 20 03/22/2023   CREATININE 1.31 (H) 03/22/2023   BILITOT 0.9 03/22/2023   ALKPHOS 122 (H) 03/22/2023   AST 19 03/22/2023   ALT  17 03/22/2023   PROT 6.8 03/22/2023   ALBUMIN 4.2 03/22/2023   CALCIUM 9.9 03/22/2023   ANIONGAP 8 08/13/2022   EGFR 54 (L) 03/22/2023   Lab Results  Component Value Date   CHOL 84 (L) 03/22/2023   Lab Results  Component Value Date   HDL 33 (L) 03/22/2023   Lab Results  Component Value Date   LDLCALC 29 03/22/2023   Lab Results  Component Value Date   TRIG 119 03/22/2023   Lab Results  Component Value Date   CHOLHDL 2.5 03/22/2023   Lab Results  Component Value Date   HGBA1C 8.2 (H) 03/22/2023       Assessment & Plan:  Cervical radiculopathy Assessment & Plan: Increase gabapentin to 300 mg three times a day.  Order mri of cervical spine.   Orders: -     MR CERVICAL SPINE WO CONTRAST; Future  Hereditary and idiopathic peripheral neuropathy Assessment & Plan: Increase gabapentin to 300 mg three times a day.        No orders of the defined types were placed in this encounter.   Orders Placed This Encounter  Procedures   MR Cervical Spine Wo Contrast     Follow-up: Return in about 3 weeks (around 06/30/2023) for chronic fasting.  An After Visit Summary was printed and given to the patient.   Clayborn Bigness I Leal-Borjas,acting as a scribe for Blane Ohara, MD.,have documented all relevant documentation on the behalf of Blane Ohara, MD,as directed by  Blane Ohara, MD while in the presence of Blane Ohara, MD.    Blane Ohara, MD  Family Practice [phone]

## 2023-06-10 DIAGNOSIS — M5412 Radiculopathy, cervical region: Secondary | ICD-10-CM | POA: Insufficient documentation

## 2023-06-10 NOTE — Assessment & Plan Note (Signed)
 Increase gabapentin to 300 mg three times a day.   Order mri of cervical spine.

## 2023-06-10 NOTE — Assessment & Plan Note (Signed)
Increase gabapentin to 300 mg three times a day.  

## 2023-06-12 ENCOUNTER — Encounter: Payer: Self-pay | Admitting: Family Medicine

## 2023-06-15 ENCOUNTER — Ambulatory Visit (HOSPITAL_COMMUNITY): Payer: PPO | Admitting: Physician Assistant

## 2023-06-16 ENCOUNTER — Ambulatory Visit (HOSPITAL_COMMUNITY)
Admission: RE | Admit: 2023-06-16 | Discharge: 2023-06-16 | Disposition: A | Discharge status: Home / Self Care | Payer: PPO | Source: Ambulatory Visit | Attending: Family Medicine | Admitting: Family Medicine

## 2023-06-16 DIAGNOSIS — R609 Edema, unspecified: Secondary | ICD-10-CM | POA: Diagnosis not present

## 2023-06-16 DIAGNOSIS — M5412 Radiculopathy, cervical region: Secondary | ICD-10-CM | POA: Diagnosis not present

## 2023-06-16 DIAGNOSIS — M47813 Spondylosis without myelopathy or radiculopathy, cervicothoracic region: Secondary | ICD-10-CM | POA: Diagnosis not present

## 2023-06-16 DIAGNOSIS — M47812 Spondylosis without myelopathy or radiculopathy, cervical region: Secondary | ICD-10-CM | POA: Diagnosis not present

## 2023-06-16 DIAGNOSIS — M4802 Spinal stenosis, cervical region: Secondary | ICD-10-CM | POA: Diagnosis not present

## 2023-06-22 ENCOUNTER — Ambulatory Visit: Payer: PPO | Admitting: Family Medicine

## 2023-06-22 VITALS — BP 120/70 | HR 72 | Temp 97.0°F | Resp 18 | Ht 73.0 in | Wt 244.0 lb

## 2023-06-22 DIAGNOSIS — D696 Thrombocytopenia, unspecified: Secondary | ICD-10-CM

## 2023-06-22 DIAGNOSIS — E1142 Type 2 diabetes mellitus with diabetic polyneuropathy: Secondary | ICD-10-CM

## 2023-06-22 DIAGNOSIS — I4891 Unspecified atrial fibrillation: Secondary | ICD-10-CM | POA: Diagnosis not present

## 2023-06-22 DIAGNOSIS — E538 Deficiency of other specified B group vitamins: Secondary | ICD-10-CM | POA: Diagnosis not present

## 2023-06-22 DIAGNOSIS — E782 Mixed hyperlipidemia: Secondary | ICD-10-CM

## 2023-06-22 DIAGNOSIS — I119 Hypertensive heart disease without heart failure: Secondary | ICD-10-CM

## 2023-06-22 DIAGNOSIS — Z951 Presence of aortocoronary bypass graft: Secondary | ICD-10-CM | POA: Diagnosis not present

## 2023-06-22 NOTE — Progress Notes (Unsigned)
Subjective:  Patient ID: Andre Shaffer, male    DOB: 1941-07-29  Age: 82 y.o. MRN: [id number]  Chief Complaint  Patient presents with   Medical Management of Chronic Issues    HPI Diabetes:  Complications: neuropathy.  Glucose checking: Checks sugars fasting daily Glucose logs: 90-150. Hypoglycemia: none  Most recent A1C: 8.2 Current medications:  Novolog insulin injection 70/30 60 U in am and 40 U before supper, Gabapentin 300 mg 1 capsule twice daily, farxiga 10 mg daily Last Eye Exam: had at the [place] one month ago.  Foot checks: daily.    Hyperlipidemia: Current medications: Atorvastatin 20 mg daily.    Hypertension/CAD: Current medications: none. Had discontinued due to bp and pulse being low.  Diet: healthy.  Exercise: cardiopulmonary rehab.      06/22/2023    9:50 AM 06/09/2023   11:47 AM 12/02/2022   10:49 AM 09/10/2022   11:36 AM 05/18/2022    9:30 AM  Depression screen PHQ 2/9  Decreased Interest 0 0 0 0 0  Down, Depressed, Hopeless 0 0 0 0 0  PHQ - 2 Score 0 0 0 0 0  Altered sleeping 0      Tired, decreased energy 0      Change in appetite 0      Feeling bad or failure about yourself  0      Trouble concentrating 0      Moving slowly or fidgety/restless 0      Suicidal thoughts 0      PHQ-9 Score 0      Difficult doing work/chores Not difficult at all            06/22/2023    9:49 AM  Fall Risk   Falls in the past year? 0  Number falls in past yr: 0  Injury with Fall? 0  Risk for fall due to : History of fall(s)  Follow up Falls evaluation completed;Follow up appointment    Patient Care Team: Blane Ohara, MD as PCP - General (Family Medicine) Wendall Stade, MD as PCP - Cardiology (Cardiology) Marlowe Sax, RN as Case Manager (General Practice) Zettie Pho, [facility] (Inactive) (Pharmacist)   Review of Systems  Constitutional:  Negative for chills and fever.  HENT:  Negative for congestion, rhinorrhea and sore throat.   Respiratory:   Negative for cough and shortness of breath.   Cardiovascular:  Negative for chest pain and palpitations.  Gastrointestinal:  Negative for abdominal pain, constipation, diarrhea, nausea and vomiting.  Genitourinary:  Negative for dysuria and urgency.  Musculoskeletal:  Positive for arthralgias (left shoulder). Negative for back pain and myalgias.  Neurological:  Positive for numbness (4th and 5th fingers left hand). Negative for dizziness and headaches.  Psychiatric/Behavioral:  Negative for dysphoric mood. The patient is not nervous/anxious.     Current Outpatient Medications on File Prior to Visit  Medication Sig Dispense Refill   acetaminophen (TYLENOL) 500 MG tablet Take 500-1,000 mg by mouth every 6 (six) hours as needed (pain.).     apixaban (ELIQUIS) 5 MG TABS tablet TAKE 1 TABLET BY MOUTH TWICE DAILY. 60 tablet 5   atorvastatin (LIPITOR) 20 MG tablet TAKE 1 TABLET BY MOUTH DAILY. 90 tablet 2   Blood Glucose Monitoring Suppl (ONETOUCH VERIO) w/Device KIT Use as instructed. Check blood glucose twice daily. E11.42 1 kit 0   clopidogrel (PLAVIX) 75 MG tablet TAKE 1 TABLET BY MOUTH EVERY DAY WITH BREAKFAST 30 tablet 6   Cyanocobalamin 1000  MCG/ML LIQD Place 2,500 mcg under the tongue in the morning. Place 0.5 dropperful (2,500 mcg) sublingually daily     dapagliflozin propanediol (FARXIGA) 10 MG TABS tablet Take 1 tablet (10 mg total) by mouth daily. 90 tablet 3   ezetimibe (ZETIA) 10 MG tablet TAKE 1 TABLET BY MOUTH DAILY. 90 tablet 0   gabapentin (NEURONTIN) 300 MG capsule TAKE 1 CAPSULE BY MOUTH 2 TIMES DAILY. 180 capsule 2   glucose blood test strip      insulin aspart protamine- aspart (NOVOLOG MIX 70/30) (70-30) 100 UNIT/ML injection Inject 0.4-0.6 mLs (40-60 Units total) into the skin See admin instructions. Inject 60 units into the skin before breakfast and 40 units units before supper/evening meal 10 mL 0   Insulin Pen Needle (NOVOFINE) 30G X 8 MM MISC Inject 10 each into the skin as  needed. E11.42 100 each 2   Lancets (ONETOUCH ULTRASOFT) lancets Use as instructed, Check blood glucose twice daily. 100 each 12   Multiple Vitamins-Minerals (PRESERVISION AREDS PO) Take 1 capsule by mouth 2 (two) times daily.     ONETOUCH VERIO test strip CHECK BLOOD SUGAR TWICE DAILY. 100 each 11   Current Facility-Administered Medications on File Prior to Visit  Medication Dose Route Frequency Provider Last Rate Last Admin   Insulin Pen Needle (NOVOFINE) 10 each  1 packet Subcutaneous PRN Renarda Mullinix, MD       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 // NSTEMI 08/2022 >> s/p DES to S-OM [L-LAD patent, S-Dx patent, S-RCA CTO] // Echo 2015: EF 45-50, inf HK // Echo 10/23: EF 55-60, no WMA   Diabetes mellitus without complication (HCC)    GERD (gastroesophageal reflux disease)    Hyperlipemia    Hypertension    MI (myocardial infarction) (HCC)    SCC (squamous cell carcinoma), arm, left    SCC (squamous cell carcinoma), arm, 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   Past Surgical History:  Procedure Laterality Date   ANKLE SURGERY  93,03   rt and lt    CARDIOVERSION N/A 10/20/2022   Procedure: CARDIOVERSION;  Surgeon: Wendall Stade, MD;  [facility];  Service: Cardiovascular;  Laterality: N/A;   CHOLECYSTECTOMY  10/19/2012   Procedure: LAPAROSCOPIC CHOLECYSTECTOMY WITH INTRAOPERATIVE CHOLANGIOGRAM;  Surgeon: Axel Filler, MD;  Location: [facility];  Service: General;  Laterality: N/A;   COLONOSCOPY     CORONARY ARTERY BYPASS GRAFT  1997   CORONARY STENT INTERVENTION N/A 08/12/2022   Procedure: CORONARY STENT INTERVENTION;  Surgeon: Kathleene Hazel, MD;  [facility];  Service: Cardiovascular;  Laterality: N/A;   ENDOSCOPIC RETROGRADE CHOLANGIOPANCREATOGRAPHY (ERCP) WITH PROPOFOL N/A 07/16/2021   Procedure:  ENDOSCOPIC RETROGRADE CHOLANGIOPANCREATOGRAPHY (ERCP) WITH PROPOFOL;  Surgeon: Rachael Fee, MD;  [facility];  Service: Endoscopy;  Laterality: N/A;   LEFT HEART CATH AND CORS/GRAFTS ANGIOGRAPHY N/A 08/12/2022   Procedure: LEFT HEART CATH AND CORS/GRAFTS ANGIOGRAPHY;  Surgeon: Kathleene Hazel, MD;  [facility];  Service: Cardiovascular;  Laterality: N/A;   REMOVAL OF STONES  07/16/2021   Procedure: REMOVAL OF STONES;  Surgeon: Rachael Fee, MD;  [facility];  Service: Endoscopy;;   SKIN CANCER EXCISION  12/2020   Squamous Cell Cancer   SPHINCTEROTOMY  07/16/2021   Procedure: SPHINCTEROTOMY;  Surgeon:  Rachael Fee, MD;  Location: [facility];  Service: Endoscopy;;   TIBIA FRACTURE SURGERY      History reviewed. No pertinent family history. Social History   Socioeconomic History   Marital status: Married    Spouse name: Not on file   Number of children: Not on file   Years of education: Not on file   Highest education level: Not on file  Occupational History   Not on file  Tobacco Use   Smoking status: Former    Current packs/day: 0.00    Average packs/day: 1 pack/day for 20.0 years (20.0 ttl pk-yrs)    Types: Cigarettes    Start date: 10/19/1955    Quit date: 10/19/1975    Years since quitting: 47.7   Smokeless tobacco: Never   Tobacco comments:    Former smoker 11/11/22  Substance and Sexual Activity   Alcohol use: No   Drug use: No   Sexual activity: Not on file  Other Topics Concern   Not on file  Social History Narrative   Not on file   Social Determinants of Health   Financial Resource Strain: High Risk (09/15/2022)   Overall Financial Resource Strain (CARDIA)    Difficulty of Paying Living Expenses: Hard  Food Insecurity: No Food Insecurity (09/10/2022)   Hunger Vital Sign    Worried About Running Out of Food in the Last Year: Never true    Ran Out of Food in the Last Year: Never true  Transportation  Needs: No Transportation Needs (09/15/2022)   PRAPARE - Administrator, Civil Service (Medical): No    Lack of Transportation (Non-Medical): No  Physical Activity: Sufficiently Active (09/10/2022)   Exercise Vital Sign    Days of Exercise per Week: 4 days    Minutes of Exercise per Session: 90 min  Stress: No Stress Concern Present (09/10/2022)   [employer] of Occupational Health - Occupational Stress Questionnaire    Feeling of Stress : Not at all  Social Connections: Socially Integrated (09/10/2022)   Social Connection and Isolation Panel [NHANES]    Frequency of Communication with Friends and Family: More than three times a week    Frequency of Social Gatherings with Friends and Family: More than three times a week    Attends Religious Services: More than 4 times per year    Active Member of [employer] or Organizations: Yes    Attends Banker Meetings: 1 to 4 times per year    Marital Status: Married    Objective:  BP 120/70   Pulse 72   Temp (!) 97 F (36.1 C)   Resp 18   Ht 6\' 1"  (1.854 m)   Wt 244 lb (110.7 kg)   BMI 32.19 kg/m      06/22/2023    9:37 AM 06/09/2023   11:42 AM 03/22/2023   11:13 AM  BP/Weight  Systolic BP 120 110 124  Diastolic BP 70 60 76  Wt. (Lbs) 244 242 243  BMI 32.19 kg/m2 31.93 kg/m2 32.06 kg/m2    Physical Exam Vitals reviewed.  Constitutional:      Appearance: Normal appearance. He is obese.  Neck:     Vascular: No carotid bruit.  Cardiovascular:     Rate and Rhythm: Normal rate and regular rhythm.     Heart sounds: Normal heart sounds.  Pulmonary:     Effort: Pulmonary effort is normal.     Breath sounds: Normal breath sounds. No wheezing, rhonchi or rales.  Abdominal:     General: Bowel sounds are normal.     Palpations: Abdomen is soft.     Tenderness: There is no abdominal tenderness.  Neurological:     Mental Status: He is alert.     Comments: Paresthesia of 4th and 5th fingers   Psychiatric:         Mood and Affect: Mood normal.        Behavior: Behavior normal.     Diabetic Foot Exam - Simple   Simple Foot Form  06/22/2023 10:32 AM  Visual Inspection No deformities, no ulcerations, no other skin breakdown bilaterally: Yes Sensation Testing Intact to touch and monofilament testing bilaterally: Yes Pulse Check Posterior Tibialis and Dorsalis pulse intact bilaterally: Yes Comments      Lab Results  Component Value Date   WBC 8.1 06/22/2023   HGB 16.0 06/22/2023   HCT 48.6 06/22/2023   PLT 149 (L) 06/22/2023   GLUCOSE 198 (H) 06/22/2023   CHOL 103 06/22/2023   TRIG 159 (H) 06/22/2023   HDL 29 (L) 06/22/2023   LDLCALC 47 06/22/2023   ALT 17 06/22/2023   AST 19 06/22/2023   NA 140 06/22/2023   K 5.3 (H) 06/22/2023   CL 104 06/22/2023   CREATININE 1.22 06/22/2023   BUN 19 06/22/2023   CO2 23 06/22/2023   TSH 4.100 05/18/2022   HGBA1C 8.1 (H) 06/22/2023   MICROALBUR 80 09/10/2021      Assessment & Plan:    Mixed hyperlipidemia Assessment & Plan: Well controlled.  No changes to medicines. Atorvastatin 20 mg daily, zetia 10 mg daily Continue to work on eating a healthy diet and exercise.    Orders: -     Lipid panel  Hypertensive heart disease without congestive heart failure Assessment & Plan: Stable  Orders: -     Comprehensive metabolic panel -     CBC with Differential/Platelet  Postsurgical aortocoronary bypass status Assessment & Plan: Management per specialist   Diabetic polyneuropathy associated with type 2 diabetes mellitus (HCC) Assessment & Plan: Control: not at goal Recommend check sugars fasting daily. Recommend check feet daily. Recommend annual eye exams. Medicines: Continue Novolog insulin injection 70/30 60 U in am and 40 U before supper, farxiga 10 mg daily, and gabapentin. Continue to work on eating a healthy diet and exercise.  Labs drawn today  Orders: -     Hemoglobin A1c  Thrombocytopenia (HCC) Assessment &  Plan: Stable.   B12 deficiency Assessment & Plan: The current medical regimen is effective;  continue present plan and medications.  Continue SL b12 2500 mg daily.    Atrial fibrillation, unspecified type [facility]) Assessment & Plan: Rate controlled.  Continue eliquis.    Other orders -     [facility] CKD Program     No orders of the defined types were placed in this encounter.   Orders Placed This Encounter  Procedures   Comprehensive metabolic panel   CBC with Differential/Platelet   Lipid panel   Hemoglobin A1c   [facility] CKD Program   [facility] DIABETES EYE EXAM     Follow-up: No follow-ups on file.   I,Carolyn M Morrison,acting as a Neurosurgeon for Blane Ohara, MD.,have documented all relevant documentation on the behalf of Blane Ohara, MD,as directed by  Blane Ohara, MD while in the presence of Blane Ohara, MD.   Clayborn Bigness I Leal-Borjas,acting as a scribe for Blane Ohara, MD.,have documented all relevant documentation on the behalf of Blane Ohara, MD,as directed by  Blane Ohara, MD while in the presence of Blane Ohara, MD.    An After Visit Summary was printed and given to the patient.  Blane Ohara, MD [facility] [phone]

## 2023-06-23 LAB — COMPREHENSIVE METABOLIC PANEL WITH GFR
ALT: 17 IU/L (ref 0–44)
AST: 19 IU/L (ref 0–40)
Albumin: 4.1 g/dL (ref 3.7–4.7)
Alkaline Phosphatase: 125 IU/L — ABNORMAL HIGH (ref 44–121)
BUN/Creatinine Ratio: 16 (ref 10–24)
BUN: 19 mg/dL (ref 8–27)
Bilirubin Total: 0.6 mg/dL (ref 0.0–1.2)
CO2: 23 mmol/L (ref 20–29)
Calcium: 9.4 mg/dL (ref 8.6–10.2)
Chloride: 104 mmol/L (ref 96–106)
Creatinine, Ser: 1.22 mg/dL (ref 0.76–1.27)
Globulin, Total: 2.4 g/dL (ref 1.5–4.5)
Glucose: 198 mg/dL — ABNORMAL HIGH (ref 70–99)
Potassium: 5.3 mmol/L — ABNORMAL HIGH (ref 3.5–5.2)
Sodium: 140 mmol/L (ref 134–144)
Total Protein: 6.5 g/dL (ref 6.0–8.5)
eGFR: 59 mL/min/1.73 — ABNORMAL LOW (ref >59)

## 2023-06-23 LAB — CBC WITH DIFFERENTIAL/PLATELET
Basophils Absolute: 0 x10E3/uL (ref 0.0–0.2)
Basos: 0 %
EOS (ABSOLUTE): 0.2 x10E3/uL (ref 0.0–0.4)
Eos: 2 %
Hematocrit: 48.6 % (ref 37.5–51.0)
Hemoglobin: 16 g/dL (ref 13.0–17.7)
Immature Grans (Abs): 0 x10E3/uL (ref 0.0–0.1)
Immature Granulocytes: 0 %
Lymphocytes Absolute: 0.9 x10E3/uL (ref 0.7–3.1)
Lymphs: 11 %
MCH: 31.8 pg (ref 26.6–33.0)
MCHC: 32.9 g/dL (ref 31.5–35.7)
MCV: 97 fL (ref 79–97)
Monocytes Absolute: 0.5 x10E3/uL (ref 0.1–0.9)
Monocytes: 6 %
Neutrophils Absolute: 6.5 x10E3/uL (ref 1.4–7.0)
Neutrophils: 81 %
Platelets: 149 x10E3/uL — ABNORMAL LOW (ref 150–450)
RBC: 5.03 x10E6/uL (ref 4.14–5.80)
RDW: 13.3 % (ref 11.6–15.4)
WBC: 8.1 x10E3/uL (ref 3.4–10.8)

## 2023-06-23 LAB — HEMOGLOBIN A1C
Est. average glucose Bld gHb Est-mCnc: 186 mg/dL
Hgb A1c MFr Bld: 8.1 % — ABNORMAL HIGH (ref 4.8–5.6)

## 2023-06-23 LAB — LIPID PANEL
Chol/HDL Ratio: 3.6 ratio (ref 0.0–5.0)
Cholesterol, Total: 103 mg/dL (ref 100–199)
HDL: 29 mg/dL — ABNORMAL LOW (ref >39)
LDL Chol Calc (NIH): 47 mg/dL (ref 0–99)
Triglycerides: 159 mg/dL — ABNORMAL HIGH (ref 0–149)
VLDL Cholesterol Cal: 27 mg/dL (ref 5–40)

## 2023-06-25 NOTE — Assessment & Plan Note (Signed)
Management per specialist. 

## 2023-06-25 NOTE — Assessment & Plan Note (Signed)
Well controlled.  No changes to medicines. Atorvastatin 20 mg daily, zetia 10 mg daily Continue to work on eating a healthy diet and exercise.

## 2023-06-25 NOTE — Assessment & Plan Note (Signed)
The current medical regimen is effective;  continue present plan and medications. Continue SL b12 2500 mg daily.

## 2023-06-25 NOTE — Assessment & Plan Note (Signed)
Stable

## 2023-06-25 NOTE — Assessment & Plan Note (Signed)
Rate controlled. Continue eliquis

## 2023-06-25 NOTE — Assessment & Plan Note (Signed)
Control: not at goal Recommend check sugars fasting daily. Recommend check feet daily. Recommend annual eye exams. Medicines: Continue Novolog insulin injection 70/30 60 U in am and 40 U before supper, farxiga 10 mg daily, and gabapentin. Continue to work on eating a healthy diet and exercise.  Labs drawn today

## 2023-06-26 ENCOUNTER — Encounter: Payer: Self-pay | Admitting: Family Medicine

## 2023-06-27 ENCOUNTER — Other Ambulatory Visit: Payer: Self-pay

## 2023-06-27 MED ORDER — PREDNISONE 50 MG PO TABS: 50.0000 mg | ORAL_TABLET | Freq: Every day | ORAL | 0 refills | Status: DC

## 2023-07-01 ENCOUNTER — Telehealth: Payer: Self-pay

## 2023-07-01 NOTE — Telephone Encounter (Signed)
Patient's wife called and left message stating that the patient started the prednisone yesterday and stated that his sugar before supper last night was 266 and she stated that she increased his insulin herself and that his sugar read 151 this morning fasting. She is wanting to know what you advise on increasing his insulin with his readings while taking the prednisone?

## 2023-07-02 NOTE — Telephone Encounter (Signed)
Done. Dr. Cox  

## 2023-07-12 DIAGNOSIS — D485 Neoplasm of uncertain behavior of skin: Secondary | ICD-10-CM | POA: Diagnosis not present

## 2023-07-14 ENCOUNTER — Other Ambulatory Visit: Payer: Self-pay

## 2023-07-14 DIAGNOSIS — Z794 Long term (current) use of insulin: Secondary | ICD-10-CM

## 2023-07-14 MED ORDER — DAPAGLIFLOZIN PROPANEDIOL 10 MG PO TABS: 10.0000 mg | ORAL_TABLET | Freq: Every day | ORAL | 3 refills | Status: DC

## 2023-07-15 ENCOUNTER — Ambulatory Visit (INDEPENDENT_AMBULATORY_CARE_PROVIDER_SITE_OTHER): Payer: PPO | Admitting: Physician Assistant

## 2023-07-15 ENCOUNTER — Encounter: Payer: Self-pay | Admitting: Physician Assistant

## 2023-07-15 VITALS — BP 130/70 | HR 84 | Temp 97.2°F | Ht 73.0 in | Wt 241.8 lb

## 2023-07-15 DIAGNOSIS — L309 Dermatitis, unspecified: Secondary | ICD-10-CM | POA: Diagnosis not present

## 2023-07-15 MED ORDER — PREDNISONE 20 MG PO TABS
20.0000 mg | ORAL_TABLET | Freq: Two times a day (BID) | ORAL | 0 refills | Status: DC
Start: 2023-07-15 — End: 2023-10-03

## 2023-07-15 MED ORDER — DOXYCYCLINE HYCLATE 100 MG PO TABS: 100.0000 mg | ORAL_TABLET | Freq: Two times a day (BID) | ORAL | 0 refills | Status: DC

## 2023-07-15 NOTE — Progress Notes (Signed)
Acute Office Visit  Subjective:    Patient ID: Andre Shaffer, male    DOB: [birth year], 82 y.o.   MRN: [id number]  Chief Complaint  Patient presents with   Rash    HPI: Patient is in today for complaints of rash on face.  He states that he had MOHS surgery on his left cheek in March and then just had follow up with dermatologist on Tuesday and had biopsy of that area done.  He says that he already had some redness of cheeks and nose present before the biopsy and then after the biopsy it worsened.  Now with small pustules around site of biopsy and nose   Current Outpatient Medications:    acetaminophen (TYLENOL) 500 MG tablet, Take 500-1,000 mg by mouth every 6 (six) hours as needed (pain.)., Disp: , Rfl:    apixaban (ELIQUIS) 5 MG TABS tablet, TAKE 1 TABLET BY MOUTH TWICE DAILY., Disp: 60 tablet, Rfl: 5   atorvastatin (LIPITOR) 20 MG tablet, TAKE 1 TABLET BY MOUTH DAILY., Disp: 90 tablet, Rfl: 2   Blood Glucose Monitoring Suppl (ONETOUCH VERIO) w/Device KIT, Use as instructed. Check blood glucose twice daily. E11.42, Disp: 1 kit, Rfl: 0   clopidogrel (PLAVIX) 75 MG tablet, TAKE 1 TABLET BY MOUTH EVERY DAY WITH BREAKFAST, Disp: 30 tablet, Rfl: 6   Cyanocobalamin 1000 MCG/ML LIQD, Place 2,500 mcg under the tongue in the morning. Place 0.5 dropperful (2,500 mcg) sublingually daily, Disp: , Rfl:    dapagliflozin propanediol (FARXIGA) 10 MG TABS tablet, Take 1 tablet (10 mg total) by mouth daily., Disp: 90 tablet, Rfl: 3   doxycycline (VIBRA-TABS) 100 MG tablet, Take 1 tablet (100 mg total) by mouth 2 (two) times daily., Disp: 20 tablet, Rfl: 0   ezetimibe (ZETIA) 10 MG tablet, TAKE 1 TABLET BY MOUTH DAILY., Disp: 90 tablet, Rfl: 0   gabapentin (NEURONTIN) 300 MG capsule, TAKE 1 CAPSULE BY MOUTH 2 TIMES DAILY., Disp: 180 capsule, Rfl: 2   glucose blood test strip, , Disp: , Rfl:    insulin aspart protamine- aspart (NOVOLOG MIX 70/30) (70-30) 100 UNIT/ML injection, Inject 0.4-0.6 mLs (40-60  Units total) into the skin See admin instructions. Inject 60 units into the skin before breakfast and 40 units units before supper/evening meal, Disp: 10 mL, Rfl: 0   Insulin Pen Needle (NOVOFINE) 30G X 8 MM MISC, Inject 10 each into the skin as needed. E11.42, Disp: 100 each, Rfl: 2   Lancets (ONETOUCH ULTRASOFT) lancets, Use as instructed, Check blood glucose twice daily., Disp: 100 each, Rfl: 12   Multiple Vitamins-Minerals (PRESERVISION AREDS PO), Take 1 capsule by mouth 2 (two) times daily., Disp: , Rfl:    ONETOUCH VERIO test strip, CHECK BLOOD SUGAR TWICE DAILY., Disp: 100 each, Rfl: 11   predniSONE (DELTASONE) 20 MG tablet, Take 1 tablet (20 mg total) by mouth 2 (two) times daily with a meal., Disp: 10 tablet, Rfl: 0  Current Facility-Administered Medications:    Insulin Pen Needle (NOVOFINE) 10 each, 1 packet, Subcutaneous, PRN, Cox, Kirsten, MD  Allergies  Allergen Reactions   Nitroglycerin Other (See Comments)    Blood pressure 'bottomed out'   Statins Other (See Comments)    Elevation of liver enzymes   Tetanus Toxoid Other (See Comments)    Severe flu symptoms fever and chills   Penicillins Rash    ROS CONSTITUTIONAL: Negative for chills, fatigue, fever,   CARDIOVASCULAR: Negative for chest pain,  RESPIRATORY: Negative for recent cough and dyspnea.  INTEGUMENTARY:see HPI      Objective:    PHYSICAL EXAM:   BP 130/70 (BP Location: Left Arm, Patient Position: Sitting, Cuff Size: Large)   Pulse 84   Temp (!) 97.2 F (36.2 C) (Temporal)   Ht 6\' 1"  (1.854 m)   Wt 241 lb 12.8 oz (109.7 kg)   SpO2 96%   BMI 31.90 kg/m    GEN: Well nourished, well developed, in no acute distress   Cardiac: RRR; no murmurs,  Respiratory:  normal respiratory rate and pattern with no distress - normal breath sounds with no rales, rhonchi, wheezes or rubs  Skin: what appears to be inflamed rosacea but also more of irritant dermatitis around incision site - small pustlules noted on  face also      Assessment & Plan:    Dermatitis -     Doxycycline Hyclate; Take 1 tablet (100 mg total) by mouth 2 (two) times daily.  Dispense: 20 tablet; Refill: 0 -     predniSONE; Take 1 tablet (20 mg total) by mouth 2 (two) times daily with a meal.  Dispense: 10 tablet; Refill: 0   Recommend for this rash to calm down and consider metrogel if rosacea symptoms persist or recur  Follow-up: Return if symptoms worsen or fail to improve.  An After Visit Summary was printed and given to the patient.  [facility]ettie [facility]agan [facility] [phone]

## 2023-07-22 ENCOUNTER — Telehealth: Payer: Self-pay

## 2023-07-22 NOTE — Telephone Encounter (Addendum)
Patient called asking for an appointment for knee pain, but he does not want to see another provider only you. He has an appointment until Thursday. I recommended to go to urgent care, but he does not want to go to urgent care. Can you see him before Thursday? Please advice.

## 2023-07-25 ENCOUNTER — Encounter: Payer: Self-pay | Admitting: Family Medicine

## 2023-07-25 ENCOUNTER — Ambulatory Visit (INDEPENDENT_AMBULATORY_CARE_PROVIDER_SITE_OTHER): Payer: PPO | Admitting: Family Medicine

## 2023-07-25 VITALS — BP 124/70 | HR 68 | Temp 97.9°F | Resp 16 | Ht 73.0 in | Wt 239.0 lb

## 2023-07-25 DIAGNOSIS — M25461 Effusion, right knee: Secondary | ICD-10-CM | POA: Diagnosis not present

## 2023-07-25 DIAGNOSIS — Z794 Long term (current) use of insulin: Secondary | ICD-10-CM | POA: Diagnosis not present

## 2023-07-25 DIAGNOSIS — M1711 Unilateral primary osteoarthritis, right knee: Secondary | ICD-10-CM | POA: Diagnosis not present

## 2023-07-25 DIAGNOSIS — E1165 Type 2 diabetes mellitus with hyperglycemia: Secondary | ICD-10-CM | POA: Diagnosis not present

## 2023-07-25 DIAGNOSIS — M25561 Pain in right knee: Secondary | ICD-10-CM | POA: Diagnosis not present

## 2023-07-25 NOTE — Progress Notes (Signed)
Acute Office Visit  Subjective:    Patient ID: Andre Shaffer, male    DOB: 08/27/1941, 82 y.o.   MRN: [id number]  Chief Complaint  Patient presents with   Knee Pain    HPI: Patient is in today for right knee pain which started about 10 days ago.  Patient had no injuries.  Patient has tried Tylenol as well as several over-the-counter rubs which have not helped.  The knee does not lock or give way.  Diabetes has been poorly controlled due to recent course of prednisone which was completed 5 days ago. Has been taking extra insulin to cover elevated sugars.   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 // NSTEMI 08/2022 >> s/p DES to S-OM [L-LAD patent, S-Dx patent, S-RCA CTO] // Echo 2015: EF 45-50, inf HK // Echo 10/23: EF 55-60, no WMA   Diabetes mellitus without complication (HCC)    GERD (gastroesophageal reflux disease)    Hyperlipemia    Hypertension    MI (myocardial infarction) (HCC)    SCC (squamous cell carcinoma), arm, left    SCC (squamous cell carcinoma), arm, 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    Past Surgical History:  Procedure Laterality Date   ANKLE SURGERY  93,03   rt and lt    CARDIOVERSION N/A 10/20/2022   Procedure: CARDIOVERSION;  Surgeon: Wendall Stade, MD;  Location: [facility];  Service: Cardiovascular;  Laterality: N/A;   CHOLECYSTECTOMY  10/19/2012   Procedure: LAPAROSCOPIC CHOLECYSTECTOMY WITH INTRAOPERATIVE CHOLANGIOGRAM;  Surgeon: Axel Filler, MD;  Location: [facility];  Service: General;  Laterality: N/A;   COLONOSCOPY     CORONARY ARTERY BYPASS GRAFT  1997   CORONARY STENT INTERVENTION N/A 08/12/2022   Procedure: CORONARY STENT INTERVENTION;  Surgeon: Kathleene Hazel, MD;  Location: [facility];  Service: Cardiovascular;  Laterality: N/A;   ENDOSCOPIC RETROGRADE  CHOLANGIOPANCREATOGRAPHY (ERCP) WITH PROPOFOL N/A 07/16/2021   Procedure: ENDOSCOPIC RETROGRADE CHOLANGIOPANCREATOGRAPHY (ERCP) WITH PROPOFOL;  Surgeon: Rachael Fee, MD;  Location: [facility];  Service: Endoscopy;  Laterality: N/A;   LEFT HEART CATH AND CORS/GRAFTS ANGIOGRAPHY N/A 08/12/2022   Procedure: LEFT HEART CATH AND CORS/GRAFTS ANGIOGRAPHY;  Surgeon: Kathleene Hazel, MD;  Location: [facility];  Service: Cardiovascular;  Laterality: N/A;   REMOVAL OF STONES  07/16/2021   Procedure: REMOVAL OF STONES;  Surgeon: Rachael Fee, MD;  Location: [facility];  Service: Endoscopy;;   SKIN CANCER EXCISION  12/2020   Squamous Cell Cancer   SPHINCTEROTOMY  07/16/2021   Procedure: SPHINCTEROTOMY;  Surgeon: Rachael Fee, MD;  Location: [facility];  Service: Endoscopy;;   TIBIA FRACTURE SURGERY      History reviewed. No pertinent family history.  Social History   Socioeconomic History   Marital status: Married    Spouse name: Not on file   Number of children: Not on file   Years of education: Not on file   Highest education level: Not on file  Occupational History   Not on file  Tobacco Use   Smoking status: Former    Current packs/day: 0.00    Average packs/day: 1 pack/day for 20.0 years (20.0 ttl pk-yrs)    Types: Cigarettes    Start date: 10/19/1955    Quit date: 10/19/1975    Years since  quitting: 47.7   Smokeless tobacco: Never   Tobacco comments:    Former smoker 11/11/22  Substance and Sexual Activity   Alcohol use: No   Drug use: No   Sexual activity: Not on file  Other Topics Concern   Not on file  Social History Narrative   Not on file   Social Determinants of Health   Financial Resource Strain: High Risk (09/15/2022)   Overall Financial Resource Strain (CARDIA)    Difficulty of Paying Living Expenses: Hard  Food Insecurity: No Food Insecurity (09/10/2022)   Hunger Vital Sign    Worried About Running Out of Food in the Last Year:  Never true    Ran Out of Food in the Last Year: Never true  Transportation Needs: No Transportation Needs (09/15/2022)   PRAPARE - Administrator, Civil Service (Medical): No    Lack of Transportation (Non-Medical): No  Physical Activity: Sufficiently Active (09/10/2022)   Exercise Vital Sign    Days of Exercise per Week: 4 days    Minutes of Exercise per Session: 90 min  Stress: No Stress Concern Present (09/10/2022)   [employer] of Occupational Health - Occupational Stress Questionnaire    Feeling of Stress : Not at all  Social Connections: Socially Integrated (09/10/2022)   Social Connection and Isolation Panel [NHANES]    Frequency of Communication with Friends and Family: More than three times a week    Frequency of Social Gatherings with Friends and Family: More than three times a week    Attends Religious Services: More than 4 times per year    Active Member of [employer] or Organizations: Yes    Attends Banker Meetings: 1 to 4 times per year    Marital Status: Married  Catering manager Violence: Not At Risk (09/10/2022)   Humiliation, Afraid, Rape, and Kick questionnaire    Fear of Current or Ex-Partner: No    Emotionally Abused: No    Physically Abused: No    Sexually Abused: No    Outpatient Medications Prior to Visit  Medication Sig Dispense Refill   acetaminophen (TYLENOL) 500 MG tablet Take 500-1,000 mg by mouth every 6 (six) hours as needed (pain.).     apixaban (ELIQUIS) 5 MG TABS tablet TAKE 1 TABLET BY MOUTH TWICE DAILY. 60 tablet 5   atorvastatin (LIPITOR) 20 MG tablet TAKE 1 TABLET BY MOUTH DAILY. 90 tablet 2   Blood Glucose Monitoring Suppl (ONETOUCH VERIO) w/Device KIT Use as instructed. Check blood glucose twice daily. E11.42 1 kit 0   clopidogrel (PLAVIX) 75 MG tablet TAKE 1 TABLET BY MOUTH EVERY DAY WITH BREAKFAST 30 tablet 6   Cyanocobalamin 1000 MCG/ML LIQD Place 2,500 mcg under the tongue in the morning. Place 0.5 dropperful  (2,500 mcg) sublingually daily     dapagliflozin propanediol (FARXIGA) 10 MG TABS tablet Take 1 tablet (10 mg total) by mouth daily. 90 tablet 3   doxycycline (VIBRA-TABS) 100 MG tablet Take 1 tablet (100 mg total) by mouth 2 (two) times daily. 20 tablet 0   ezetimibe (ZETIA) 10 MG tablet TAKE 1 TABLET BY MOUTH DAILY. 90 tablet 0   gabapentin (NEURONTIN) 300 MG capsule TAKE 1 CAPSULE BY MOUTH 2 TIMES DAILY. 180 capsule 2   glucose blood test strip      insulin aspart protamine- aspart (NOVOLOG MIX 70/30) (70-30) 100 UNIT/ML injection Inject 0.4-0.6 mLs (40-60 Units total) into the skin See admin instructions. Inject 60 units into the skin before breakfast  and 40 units units before supper/evening meal 10 mL 0   Insulin Pen Needle (NOVOFINE) 30G X 8 MM MISC Inject 10 each into the skin as needed. E11.42 100 each 2   Lancets (ONETOUCH ULTRASOFT) lancets Use as instructed, Check blood glucose twice daily. 100 each 12   Multiple Vitamins-Minerals (PRESERVISION AREDS PO) Take 1 capsule by mouth 2 (two) times daily.     ONETOUCH VERIO test strip CHECK BLOOD SUGAR TWICE DAILY. 100 each 11   predniSONE (DELTASONE) 20 MG tablet Take 1 tablet (20 mg total) by mouth 2 (two) times daily with a meal. 10 tablet 0   Facility-Administered Medications Prior to Visit  Medication Dose Route Frequency Provider Last Rate Last Admin   Insulin Pen Needle (NOVOFINE) 10 each  1 packet Subcutaneous PRN Lonney Revak, MD        Allergies  Allergen Reactions   Nitroglycerin Other (See Comments)    Blood pressure 'bottomed out'   Statins Other (See Comments)    Elevation of liver enzymes   Tetanus Toxoid Other (See Comments)    Severe flu symptoms fever and chills   Penicillins Rash    Review of Systems  Constitutional:  Negative for chills and fever.  HENT:  Negative for congestion, rhinorrhea and sore throat.   Respiratory:  Negative for cough and shortness of breath.   Cardiovascular:  Negative for chest pain  and palpitations.  Gastrointestinal:  Negative for constipation, diarrhea, nausea and vomiting.  Genitourinary:  Negative for dysuria.  Musculoskeletal:  Positive for arthralgias (right knee pain).  Neurological:  Negative for dizziness and headaches.  Psychiatric/Behavioral:  Negative for dysphoric mood. The patient is not nervous/anxious.        Objective:        07/25/2023    9:47 AM 07/15/2023    9:34 AM 06/22/2023    9:37 AM  Vitals with BMI  Height 6\' 1"  6\' 1"  6\' 1"   Weight 239 lbs 241 lbs 13 oz 244 lbs  BMI 31.54 31.91 32.2  Systolic 124 130 638  Diastolic 70 70 70  Pulse 68 84 72    No data found.   Physical Exam Vitals reviewed.  Constitutional:      Appearance: He is obese.  Musculoskeletal:        General: Tenderness (right medial ligament line.) present.     Comments: RIGHT KNEE EXAM Tender: medially Patellar apprehension: positive Latera ligament laxity: negative McMurray's signs: unable to assess due to pain with minimal flexion. Anterior drawer movement/Lachmans: negative Knee: warm to touch.   LEFT KNEE EXAM Tender: negative Patellar apprehension: negative.   Neurological:     Mental Status: He is alert.   Joint Injection/Arthrocentesis  Date/Time: 07/25/2023 10:14 AM  Performed by: Blane Ohara, MD Authorized by: Blane Ohara, MD  Indications: pain and joint swelling  Body area: knee Joint: right knee Local anesthesia used: yes  Anesthesia: Local anesthesia used: yes Local Anesthetic: topical anesthetic  Sedation: Patient sedated: no  Preparation: Patient was prepped and draped in the usual sterile fashion. Needle size: 22 G Ultrasound guidance: no Approach: medial (and inferior) Aspirate amount: 0 mL Triamcinolone amount: 40 mg Lidocaine 1% amount: 5 mL Patient tolerance: patient tolerated the procedure well with no immediate complications Comments: Risks were discussed including bleeding, infection, increase in sugars if  diabetic, atrophy at site of injection, and increased pain.        Health Maintenance Due  Topic Date Due   Zoster Vaccines- Shingrix (1  of 2) Never done   Pneumonia Vaccine 13+ Years old (2 of 2 - PCV) 08/02/2015   Medicare Annual Wellness (AWV)  06/01/2017   FOOT EXAM  05/19/2023   INFLUENZA VACCINE  06/02/2023   COVID-19 Vaccine (5 - 2023-24 season) 07/03/2023    There are no preventive care reminders to display for this patient.   Lab Results  Component Value Date   TSH 4.100 05/18/2022   Lab Results  Component Value Date   WBC 8.1 06/22/2023   HGB 16.0 06/22/2023   HCT 48.6 06/22/2023   MCV 97 06/22/2023   PLT 149 (L) 06/22/2023   Lab Results  Component Value Date   NA 140 06/22/2023   K 5.3 (H) 06/22/2023   CO2 23 06/22/2023   GLUCOSE 198 (H) 06/22/2023   BUN 19 06/22/2023   CREATININE 1.22 06/22/2023   BILITOT 0.6 06/22/2023   ALKPHOS 125 (H) 06/22/2023   AST 19 06/22/2023   ALT 17 06/22/2023   PROT 6.5 06/22/2023   ALBUMIN 4.1 06/22/2023   CALCIUM 9.4 06/22/2023   ANIONGAP 8 08/13/2022   EGFR 59 (L) 06/22/2023   Lab Results  Component Value Date   CHOL 103 06/22/2023   Lab Results  Component Value Date   HDL 29 (L) 06/22/2023   Lab Results  Component Value Date   LDLCALC 47 06/22/2023   Lab Results  Component Value Date   TRIG 159 (H) 06/22/2023   Lab Results  Component Value Date   CHOLHDL 3.6 06/22/2023   Lab Results  Component Value Date   HGBA1C 8.1 (H) 06/22/2023       Assessment & Plan:  Acute pain of right knee Assessment & Plan: Injection given. Continue Tylenol. Check CBC and uric acid. Discussed increase in sugars and him having to take some increased insulin  Orders: -     DG Knee Complete 4 Views Right; Future -     CBC with Differential/Platelet -     Uric acid -     Arthrocentesis  Type 2 diabetes mellitus with hyperglycemia, with long-term current use of insulin (HCC) Assessment & Plan: Adjust insulin if  needed. Recommend 5-10 U if over 200.      No orders of the defined types were placed in this encounter.   Orders Placed This Encounter  Procedures   Joint Injection/Arthrocentesis   DG Knee Complete 4 Views Right   CBC with Differential/Platelet   Uric Acid     Follow-up: No follow-ups on file.  An After Visit Summary was printed and given to the patient.  Blane Ohara, MD [facility] [phone]

## 2023-07-25 NOTE — Assessment & Plan Note (Signed)
Injection given. Continue Tylenol. Check CBC and uric acid. Discussed increase in sugars and him having to take some increased insulin

## 2023-07-25 NOTE — Assessment & Plan Note (Signed)
Adjust insulin if needed. Recommend 5-10 U if over 200.

## 2023-07-25 NOTE — Telephone Encounter (Signed)
scheduled on Monday

## 2023-07-26 LAB — CBC WITH DIFFERENTIAL/PLATELET
Basophils Absolute: 0 10*3/uL (ref 0.0–0.2)
Basos: 0 %
EOS (ABSOLUTE): 0.3 10*3/uL (ref 0.0–0.4)
Eos: 3 %
Hematocrit: 51.9 % — ABNORMAL HIGH (ref 37.5–51.0)
Hemoglobin: 17.2 g/dL (ref 13.0–17.7)
Immature Grans (Abs): 0 10*3/uL (ref 0.0–0.1)
Immature Granulocytes: 0 %
Lymphocytes Absolute: 1.2 10*3/uL (ref 0.7–3.1)
Lymphs: 12 %
MCH: 33 pg (ref 26.6–33.0)
MCHC: 33.1 g/dL (ref 31.5–35.7)
MCV: 99 fL — ABNORMAL HIGH (ref 79–97)
Monocytes Absolute: 0.6 10*3/uL (ref 0.1–0.9)
Monocytes: 6 %
Neutrophils Absolute: 7.8 10*3/uL — ABNORMAL HIGH (ref 1.4–7.0)
Neutrophils: 79 %
Platelets: 146 10*3/uL — ABNORMAL LOW (ref 150–450)
RBC: 5.22 x10E6/uL (ref 4.14–5.80)
RDW: 13.3 % (ref 11.6–15.4)
WBC: 10 10*3/uL (ref 3.4–10.8)

## 2023-07-26 LAB — URIC ACID: Uric Acid: 4.4 mg/dL (ref 3.8–8.4)

## 2023-07-28 ENCOUNTER — Ambulatory Visit: Payer: PPO | Admitting: Family Medicine

## 2023-09-08 ENCOUNTER — Other Ambulatory Visit: Payer: Self-pay | Admitting: Family Medicine

## 2023-09-09 DIAGNOSIS — L570 Actinic keratosis: Secondary | ICD-10-CM | POA: Diagnosis not present

## 2023-09-17 ENCOUNTER — Other Ambulatory Visit: Payer: Self-pay | Admitting: Family Medicine

## 2023-09-28 ENCOUNTER — Telehealth: Payer: Self-pay

## 2023-09-28 NOTE — Telephone Encounter (Signed)
Phone call made to Andre Shaffer in regards to 2025 PAP Re-enrollment for Farxiga.  Patient handed phone to wife, HIPAA identifiers verified.  Patient's wife stated they received a letter in the mail from [employer] stating he does not qualify for pt. Assistance for 2025.  She said he has new insurance that will cover it 100%, and will no longer need patient assistance.

## 2023-09-28 NOTE — Progress Notes (Signed)
Pharmacy Medication Assistance Program Note    09/28/2023  Patient ID: JAIRON ARDELEAN, male   DOB: 1941-09-26, 82 y.o.   MRN: [id number]     09/28/2023  Outreach [facility]  Manufacturer [facility]ication [facility] Nurse, adult Drugs Farxiga  Dose of Farxiga 10mg   Type of Sport and exercise psychologist    Per patient he received a letter that he does not qaulify for 2025 and did not want to pursue PAP.  Signature Tresea Mall, CPHT/Patient Advocate [place] Direct Line: [phone] Fax: [phone]

## 2023-10-03 ENCOUNTER — Ambulatory Visit: Payer: PPO | Admitting: Family Medicine

## 2023-10-03 ENCOUNTER — Telehealth: Payer: Self-pay

## 2023-10-03 ENCOUNTER — Encounter: Payer: Self-pay | Admitting: Family Medicine

## 2023-10-03 VITALS — BP 130/74 | HR 84 | Temp 97.6°F | Ht 73.0 in | Wt 247.0 lb

## 2023-10-03 DIAGNOSIS — I480 Paroxysmal atrial fibrillation: Secondary | ICD-10-CM

## 2023-10-03 DIAGNOSIS — Z794 Long term (current) use of insulin: Secondary | ICD-10-CM

## 2023-10-03 DIAGNOSIS — E782 Mixed hyperlipidemia: Secondary | ICD-10-CM | POA: Diagnosis not present

## 2023-10-03 DIAGNOSIS — Z23 Encounter for immunization: Secondary | ICD-10-CM

## 2023-10-03 DIAGNOSIS — I119 Hypertensive heart disease without heart failure: Secondary | ICD-10-CM | POA: Diagnosis not present

## 2023-10-03 DIAGNOSIS — E1165 Type 2 diabetes mellitus with hyperglycemia: Secondary | ICD-10-CM

## 2023-10-03 NOTE — Progress Notes (Addendum)
Subjective:  Patient ID: Andre Shaffer, male    DOB: [birth year]  Age: 82 y.o. MRN: [id number]  Chief Complaint  Patient presents with   Medical Management of Chronic Issues    HPI The patient, with a history of diabetes, presents for a routine follow-up. He reports a period of elevated blood sugars, ranging from 150-200, approximately 3-4 weeks ago. However, since then, his blood sugars have improved, ranging from 100-150, and in the last couple of weeks, consistently below 125. He did not bring his blood glucose log to the appointment, but reports no current problems. His last hemoglobin A1c was 8.1. He is currently on Farxiga 10mg  once daily and NovoLog 70/30, 60 units in the morning and 45 units before supper. He also takes gabapentin 300mg  twice daily for neuropathy.  The patient also reports a cracking of the skin on his right heel and discomfort from his little toe to halfway to his heel, which improved after changing his shoe inserts. He has been applying lotion to his dry feet.  He has completed cardiopulmonary rehab and is not maintaining his exercise at home.  He has been experiencing a lot of gas, but denies any changes in his diet.  Diabetes:  Complications: neuropathy.  Glucose checking: Checks sugars fasting daily Glucose logs: 100-150. Hypoglycemia: none  Most recent A1C: 8.1% Current medications:  Novolog insulin injection 70/30 60 U in am and 45 U before supper, Gabapentin 300 mg 1 capsule twice daily, farxiga 10 mg daily Last Eye Exam: had at the [place] one month ago.  Foot checks: daily.    Hyperlipidemia: Current medications: Atorvastatin 20 mg daily.    Hypertension/CAD: Current medications: none. Had discontinued due to bp and pulse being low.  Diet: healthy.      07/25/2023    9:53 AM 06/22/2023    9:50 AM 06/09/2023   11:47 AM 12/02/2022   10:49 AM 09/10/2022   11:36 AM  Depression screen PHQ 2/9  Decreased Interest 0 0 0 0 0  Down, Depressed, Hopeless 0 0 0  0 0  PHQ - 2 Score 0 0 0 0 0  Altered sleeping 0 0     Tired, decreased energy 0 0     Change in appetite 0 0     Feeling bad or failure about yourself  0 0     Trouble concentrating 0 0     Moving slowly or fidgety/restless 0 0     Suicidal thoughts 0 0     PHQ-9 Score 0 0     Difficult doing work/chores Not difficult at all Not difficult at all           07/25/2023    9:53 AM  Fall Risk   Falls in the past year? 0  Number falls in past yr: 0  Injury with Fall? 0  Risk for fall due to : No Fall Risks;Impaired mobility    Patient Care Team: Blane Ohara, MD as PCP - General (Family Medicine) Wendall Stade, MD as PCP - Cardiology (Cardiology) Marlowe Sax, RN as Case Manager (General Practice) Zettie Pho, [facility] (Inactive) (Pharmacist)   Review of Systems  Constitutional:  Negative for chills, diaphoresis, fatigue and fever.  HENT:  Negative for congestion, ear pain and sore throat.   Respiratory:  Negative for cough and shortness of breath.   Cardiovascular:  Negative for chest pain and leg swelling.  Gastrointestinal:  Negative for abdominal pain, constipation, diarrhea, nausea and vomiting.  Genitourinary:  Negative for dysuria and urgency.  Musculoskeletal:  Negative for arthralgias and myalgias.  Neurological:  Negative for dizziness and headaches.  Psychiatric/Behavioral:  Negative for dysphoric mood.     Current Outpatient Medications on File Prior to Visit  Medication Sig Dispense Refill   apixaban (ELIQUIS) 5 MG TABS tablet TAKE 1 TABLET BY MOUTH TWICE DAILY. 60 tablet 5   atorvastatin (LIPITOR) 20 MG tablet TAKE 1 TABLET BY MOUTH DAILY. 90 tablet 2   clopidogrel (PLAVIX) 75 MG tablet TAKE 1 TABLET BY MOUTH EVERY DAY WITH BREAKFAST 30 tablet 6   dapagliflozin propanediol (FARXIGA) 10 MG TABS tablet Take 1 tablet (10 mg total) by mouth daily. 90 tablet 3   ezetimibe (ZETIA) 10 MG tablet TAKE 1 TABLET BY MOUTH DAILY. 90 tablet 0   gabapentin (NEURONTIN) 300  MG capsule TAKE 1 CAPSULE BY MOUTH 2 TIMES DAILY. 180 capsule 2   insulin aspart protamine- aspart (NOVOLOG MIX 70/30) (70-30) 100 UNIT/ML injection Inject 0.4-0.6 mLs (40-60 Units total) into the skin See admin instructions. Inject 60 units into the skin before breakfast and 40 units units before supper/evening meal 10 mL 0   Multiple Vitamins-Minerals (PRESERVISION AREDS PO) Take 1 capsule by mouth 2 (two) times daily.     acetaminophen (TYLENOL) 500 MG tablet Take 500-1,000 mg by mouth every 6 (six) hours as needed (pain.).     Blood Glucose Monitoring Suppl (ONETOUCH VERIO) w/Device KIT Use as instructed. Check blood glucose twice daily. E11.42 1 kit 0   Cyanocobalamin 1000 MCG/ML LIQD Place 2,500 mcg under the tongue in the morning. Place 0.5 dropperful (2,500 mcg) sublingually daily     GLOBAL EASE INJECT PEN NEEDLES 31G X 8 MM MISC USE TO INJECT INTO THE SKIN TWICE A DAY 100 each 1   glucose blood test strip      Insulin Pen Needle (NOVOFINE) 30G X 8 MM MISC Inject 10 each into the skin as needed. E11.42 100 each 2   Lancets (ONETOUCH ULTRASOFT) lancets Use as instructed, Check blood glucose twice daily. 100 each 12   ONETOUCH VERIO test strip CHECK BLOOD SUGAR TWICE DAILY. 100 each 11   Current Facility-Administered Medications on File Prior to Visit  Medication Dose Route Frequency Provider Last Rate Last Admin   Insulin Pen Needle (NOVOFINE) 10 each  1 packet Subcutaneous PRN Zan Orlick, MD       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 // NSTEMI 08/2022 >> s/p DES to S-OM [L-LAD patent, S-Dx patent, S-RCA CTO] // Echo 2015: EF 45-50, inf HK // Echo 10/23: EF 55-60, no WMA   Diabetes mellitus without complication (HCC)    GERD (gastroesophageal reflux disease)    Hyperlipemia    Hypertension    MI (myocardial infarction) (HCC)     SCC (squamous cell carcinoma), arm, left    SCC (squamous cell carcinoma), arm, 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   Past Surgical History:  Procedure Laterality Date   ANKLE SURGERY  93,03   rt and lt    CARDIOVERSION N/A 10/20/2022   Procedure: CARDIOVERSION;  Surgeon: Wendall Stade, MD;  Location: [facility] ENDOSCOPY;  Service: Cardiovascular;  Laterality: N/A;   CHOLECYSTECTOMY  10/19/2012   Procedure: LAPAROSCOPIC CHOLECYSTECTOMY WITH INTRAOPERATIVE CHOLANGIOGRAM;  Surgeon: Axel Filler, MD;  Location: [facility];  Service: General;  Laterality: N/A;   COLONOSCOPY     CORONARY ARTERY BYPASS GRAFT  1997   CORONARY STENT INTERVENTION N/A 08/12/2022   Procedure: CORONARY STENT INTERVENTION;  Surgeon: Kathleene Hazel, MD;  Location: [facility] INVASIVE [facility] LAB;  Service: Cardiovascular;  Laterality: N/A;   ENDOSCOPIC RETROGRADE CHOLANGIOPANCREATOGRAPHY (ERCP) WITH PROPOFOL N/A 07/16/2021   Procedure: ENDOSCOPIC RETROGRADE CHOLANGIOPANCREATOGRAPHY (ERCP) WITH PROPOFOL;  Surgeon: Rachael Fee, MD;  Location: [facility] ENDOSCOPY;  Service: Endoscopy;  Laterality: N/A;   LEFT HEART CATH AND CORS/GRAFTS ANGIOGRAPHY N/A 08/12/2022   Procedure: LEFT HEART CATH AND CORS/GRAFTS ANGIOGRAPHY;  Surgeon: Kathleene Hazel, MD;  Location: [facility]VASIVE CV LAB;  Service: Cardiovascular;  Laterality: N/A;   REMOVAL OF STONES  07/16/2021   Procedure: REMOVAL OF STONES;  Surgeon: Rachael Fee, MD;  Location: [facility] ENDOSCOPY;  Service: Endoscopy;;   SKIN CANCER EXCISION  12/2020   Squamous Cell Cancer   SPHINCTEROTOMY  07/16/2021   Procedure: SPHINCTEROTOMY;  Surgeon: Rachael Fee, MD;  Location: [facility] ENDOSCOPY;  Service: Endoscopy;;   TIBIA FRACTURE SURGERY      History reviewed. No pertinent family history. Social History   Socioeconomic History   Marital status: Married    Spouse name: Not on file   Number of children: Not on file   Years of education: Not on file   Highest education level: Not on file   Occupational History   Not on file  Tobacco Use   Smoking status: Former    Current packs/day: 0.00    Average packs/day: 1 pack/day for 20.0 years (20.0 ttl pk-yrs)    Types: Cigarettes    Start date: 10/19/1955    Quit date: 10/19/1975    Years since quitting: 48.0   Smokeless tobacco: Never   Tobacco comments:    Former smoker 11/11/22  Substance and Sexual Activity   Alcohol use: No   Drug use: No   Sexual activity: Not on file  Other Topics Concern   Not on file  Social History Narrative   Not on file   Social Drivers of Health   Financial Resource Strain: High Risk (09/15/2022)   Overall Financial Resource Strain (CARDIA)    Difficulty of Paying Living Expenses: Hard  Food Insecurity: No Food Insecurity (10/03/2023)   Hunger Vital Sign    Worried About Running Out of Food in the Last Year: Never true    Ran Out of Food in the Last Year: Never true  Transportation Needs: No Transportation Needs (10/03/2023)   PRAPARE - Administrator, Civil Service (Medical): No    Lack of Transportation (Non-Medical): No  Physical Activity: Sufficiently Active (10/03/2023)   Exercise Vital Sign    Days of Exercise per Week: 4 days    Minutes of Exercise per Session: 90 min  Stress: No Stress Concern Present (10/03/2023)   [employer] of Occupational Health - Occupational Stress Questionnaire    Feeling of Stress : Not at all  Social Connections: Socially Integrated (10/03/2023)   Social Connection and Isolation Panel [NHANES]    Frequency of Communication with Friends and Family: More than three times a week    Frequency of Social Gatherings with Friends and Family: More than three times a week    Attends Religious Services: More than 4 times per year    Active Member of [employer] or Organizations: Yes    Attends Banker Meetings: 1 to 4 times per year    Marital Status:  Married    Objective:  BP 130/74   Pulse 84   Temp 97.6 F (36.4 C)   Ht 6\' 1"   (1.854 m)   Wt 247 lb (112 kg)   SpO2 95%   BMI 32.59 kg/m      10/03/2023   11:44 AM 07/25/2023    9:47 AM 07/15/2023    9:34 AM  BP/Weight  Systolic BP 130 124 130  Diastolic BP 74 70 70  Wt. (Lbs) 247 239 241.8  BMI 32.59 kg/m2 31.53 kg/m2 31.9 kg/m2    Physical Exam Vitals reviewed.  Constitutional:      Appearance: Normal appearance. He is obese.  Cardiovascular:     Rate and Rhythm: Normal rate and regular rhythm.     Heart sounds: No murmur heard. Pulmonary:     Effort: Pulmonary effort is normal.     Breath sounds: Normal breath sounds.  Abdominal:     General: Abdomen is flat. Bowel sounds are normal.     Palpations: Abdomen is soft.     Tenderness: There is no abdominal tenderness.  Neurological:     Mental Status: He is alert and oriented to person, place, and time.  Psychiatric:        Mood and Affect: Mood normal.        Behavior: Behavior normal.     Diabetic Foot Exam - Simple   Simple Foot Form  10/03/2023  9:49 PM  Visual Inspection See comments: Yes Sensation Testing See comments: Yes Pulse Check Posterior Tibialis and Dorsalis pulse intact bilaterally: Yes Comments Thickened nails. Dry skin. Cracking of right heel.  Left foot no cracks. No sores.  Non tender.  Decreased sensation.        Lab Results  Component Value Date   WBC 10.2 10/03/2023   HGB 17.0 10/03/2023   HCT 51.9 (H) 10/03/2023   PLT 158 10/03/2023   GLUCOSE 154 (H) 10/03/2023   CHOL 104 10/03/2023   TRIG 91 10/03/2023   HDL 38 (L) 10/03/2023   LDLCALC 48 10/03/2023   ALT 16 10/03/2023   AST 23 10/03/2023   NA 145 (H) 10/03/2023   K 4.8 10/03/2023   CL 105 10/03/2023   CREATININE 1.11 10/03/2023   BUN 22 10/03/2023   CO2 23 10/03/2023   TSH 4.100 05/18/2022   HGBA1C 7.7 (H) 10/03/2023   MICROALBUR 80 09/10/2021      Assessment & Plan:    Hypertensive heart disease without congestive heart failure Assessment & Plan: Stable. No antihypertensives.   Continue farxiga 10 mg daily.  Continue plavix.  Orders: -     CBC with Differential/Platelet -     Comprehensive metabolic panel  Type 2 diabetes mellitus with hyperglycemia, with long-term current use of insulin (HCC) Assessment & Plan: Blood glucose levels have been between 100-150 mg/dL, with some instances of 150-200 mg/dL a few weeks ago. Last HbA1c was 8.1. Patient is on [place] 10mg  daily and NovoLog 70/30 (60 units in the morning and 45 units before supper). Patient also takes Gabapentin 300mg  twice daily for neuropathy. Recommend start on ozempic.  -Continue current medication regimen. -Check HbA1c today.  Orders: -     Hemoglobin A1c  Mixed hyperlipidemia Assessment & Plan: Well controlled.  No changes to medicines. Atorvastatin 20 mg daily, zetia 10 mg daily Continue to work on eating a healthy diet and exercise.    Orders: -     Lipid panel  Encounter for immunization -  Flu Vaccine Trivalent High Dose (Fluad)  Paroxysmal atrial fibrillation (HCC) Assessment & Plan: Well controlled. Continue eliquis.      No orders of the defined types were placed in this encounter.   Orders Placed This Encounter  Procedures   Flu Vaccine Trivalent High Dose (Fluad)   CBC with Differential/Platelet   Comprehensive metabolic panel   Hemoglobin A1c   Lipid panel     Follow-up: Return in about 3 months (around 01/01/2024) for chronic follow up.   I,Marla I Leal-Borjas,acting as a scribe for Blane Ohara, MD.,have documented all relevant documentation on the behalf of Blane Ohara, MD,as directed by  Blane Ohara, MD while in the presence of Blane Ohara, MD.   An After Visit Summary was printed and given to the patient.  I attest that I have reviewed this visit and agree with the plan scribed by my staff.   Blane Ohara, MD [facility] [phone]

## 2023-10-03 NOTE — Telephone Encounter (Signed)
Contacted Andre Shaffer to schedule their annual wellness visit. Patient declined to schedule AWV at this time.

## 2023-10-04 LAB — CBC WITH DIFFERENTIAL/PLATELET
Basophils Absolute: 0.1 10*3/uL (ref 0.0–0.2)
Basos: 1 %
EOS (ABSOLUTE): 0.3 10*3/uL (ref 0.0–0.4)
Eos: 3 %
Hematocrit: 51.9 % — ABNORMAL HIGH (ref 37.5–51.0)
Hemoglobin: 17 g/dL (ref 13.0–17.7)
Immature Grans (Abs): 0 10*3/uL (ref 0.0–0.1)
Immature Granulocytes: 0 %
Lymphocytes Absolute: 1.4 10*3/uL (ref 0.7–3.1)
Lymphs: 13 %
MCH: 32.8 pg (ref 26.6–33.0)
MCHC: 32.8 g/dL (ref 31.5–35.7)
MCV: 100 fL — ABNORMAL HIGH (ref 79–97)
Monocytes Absolute: 0.6 10*3/uL (ref 0.1–0.9)
Monocytes: 6 %
Neutrophils Absolute: 7.9 10*3/uL — ABNORMAL HIGH (ref 1.4–7.0)
Neutrophils: 77 %
Platelets: 158 10*3/uL (ref 150–450)
RBC: 5.18 x10E6/uL (ref 4.14–5.80)
RDW: 13.4 % (ref 11.6–15.4)
WBC: 10.2 10*3/uL (ref 3.4–10.8)

## 2023-10-04 LAB — LIPID PANEL
Chol/HDL Ratio: 2.7 {ratio} (ref 0.0–5.0)
Cholesterol, Total: 104 mg/dL (ref 100–199)
HDL: 38 mg/dL — ABNORMAL LOW (ref >39)
LDL Chol Calc (NIH): 48 mg/dL (ref 0–99)
Triglycerides: 91 mg/dL (ref 0–149)
VLDL Cholesterol Cal: 18 mg/dL (ref 5–40)

## 2023-10-04 LAB — COMPREHENSIVE METABOLIC PANEL
ALT: 16 [IU]/L (ref 0–44)
AST: 23 [IU]/L (ref 0–40)
Albumin: 4 g/dL (ref 3.7–4.7)
Alkaline Phosphatase: 120 [IU]/L (ref 44–121)
BUN/Creatinine Ratio: 20 (ref 10–24)
BUN: 22 mg/dL (ref 8–27)
Bilirubin Total: 0.7 mg/dL (ref 0.0–1.2)
CO2: 23 mmol/L (ref 20–29)
Calcium: 9.4 mg/dL (ref 8.6–10.2)
Chloride: 105 mmol/L (ref 96–106)
Creatinine, Ser: 1.11 mg/dL (ref 0.76–1.27)
Globulin, Total: 2.4 g/dL (ref 1.5–4.5)
Glucose: 154 mg/dL — ABNORMAL HIGH (ref 70–99)
Potassium: 4.8 mmol/L (ref 3.5–5.2)
Sodium: 145 mmol/L — ABNORMAL HIGH (ref 134–144)
Total Protein: 6.4 g/dL (ref 6.0–8.5)
eGFR: 65 mL/min/{1.73_m2} (ref >59)

## 2023-10-04 LAB — HEMOGLOBIN A1C
Est. average glucose Bld gHb Est-mCnc: 174 mg/dL
Hgb A1c MFr Bld: 7.7 % — ABNORMAL HIGH (ref 4.8–5.6)

## 2023-10-04 NOTE — Assessment & Plan Note (Addendum)
Stable. No antihypertensives.  Continue farxiga 10 mg daily.  Continue plavix.

## 2023-10-04 NOTE — Assessment & Plan Note (Signed)
Well controlled. Continue eliquis.

## 2023-10-04 NOTE — Assessment & Plan Note (Signed)
Well controlled.  No changes to medicines. Atorvastatin 20 mg daily, zetia 10 mg daily Continue to work on eating a healthy diet and exercise.

## 2023-10-04 NOTE — Assessment & Plan Note (Signed)
Blood glucose levels have been between 100-150 mg/dL, with some instances of 150-200 mg/dL a few weeks ago. Last HbA1c was 8.1. Patient is on Farxiga 10mg  daily and NovoLog 70/30 (60 units in the morning and 45 units before supper). Patient also takes Gabapentin 300mg  twice daily for neuropathy. Recommend start on ozempic.  -Continue current medication regimen. -Check HbA1c today.

## 2023-10-07 ENCOUNTER — Telehealth: Payer: Self-pay

## 2023-10-07 NOTE — Telephone Encounter (Signed)
Received denial fax from [employer] regarding 2025 re enrollment, pt aware and no longer requires PAP

## 2023-11-05 ENCOUNTER — Other Ambulatory Visit: Payer: Self-pay | Admitting: Family Medicine

## 2024-01-02 NOTE — Progress Notes (Deleted)
 Subjective:  Patient ID: Andre Shaffer, male    DOB: Apr 21, 1941  Age: 83 y.o. MRN: [id number]  Chief Complaint  Patient presents with   Medical Management of Chronic Issues    HPI    Diabetes:  Complications: neuropathy.  Glucose checking: Checks sugars fasting daily Glucose logs: 100-150. Hypoglycemia: none  Most recent A1C: 7.7% Current medications:  Novolog insulin injection 70/30 60 U in am and 45 U before supper, Gabapentin 300 mg 1 capsule twice daily, farxiga 10 mg daily Last Eye Exam: had at the [place] one month ago.  Foot checks: daily.    Hyperlipidemia: Current medications: Atorvastatin 20 mg daily.    Hypertension/CAD: Current medications: none. Had discontinued due to bp and pulse being low.  Diet: healthy.      07/25/2023    9:53 AM 06/22/2023    9:50 AM 06/09/2023   11:47 AM 12/02/2022   10:49 AM 09/10/2022   11:36 AM  Depression screen PHQ 2/9  Decreased Interest 0 0 0 0 0  Down, Depressed, Hopeless 0 0 0 0 0  PHQ - 2 Score 0 0 0 0 0  Altered sleeping 0 0     Tired, decreased energy 0 0     Change in appetite 0 0     Feeling bad or failure about yourself  0 0     Trouble concentrating 0 0     Moving slowly or fidgety/restless 0 0     Suicidal thoughts 0 0     PHQ-9 Score 0 0     Difficult doing work/chores Not difficult at all Not difficult at all           07/25/2023    9:53 AM  Fall Risk   Falls in the past year? 0  Number falls in past yr: 0  Injury with Fall? 0  Risk for fall due to : No Fall Risks;Impaired mobility    Patient Care Team: Blane Ohara, MD as PCP - General (Family Medicine) Wendall Stade, MD as PCP - Cardiology (Cardiology) Marlowe Sax, RN as Case Manager (General Practice) Zettie Pho, [facility] (Inactive) (Pharmacist)   Review of Systems  Current Outpatient Medications on File Prior to Visit  Medication Sig Dispense Refill   acetaminophen (TYLENOL) 500 MG tablet Take 500-1,000 mg by mouth every 6 (six) hours as  needed (pain.).     apixaban (ELIQUIS) 5 MG TABS tablet TAKE 1 TABLET BY MOUTH TWICE DAILY. 60 tablet 5   atorvastatin (LIPITOR) 20 MG tablet TAKE 1 TABLET BY MOUTH DAILY. 90 tablet 1   Blood Glucose Monitoring Suppl (ONETOUCH VERIO) w/Device KIT Use as instructed. Check blood glucose twice daily. E11.42 1 kit 0   clopidogrel (PLAVIX) 75 MG tablet TAKE 1 TABLET BY MOUTH EVERY DAY WITH BREAKFAST 30 tablet 6   Cyanocobalamin 1000 MCG/ML LIQD Place 2,500 mcg under the tongue in the morning. Place 0.5 dropperful (2,500 mcg) sublingually daily     dapagliflozin propanediol (FARXIGA) 10 MG TABS tablet Take 1 tablet (10 mg total) by mouth daily. 90 tablet 3   ezetimibe (ZETIA) 10 MG tablet TAKE 1 TABLET BY MOUTH DAILY. 90 tablet 0   gabapentin (NEURONTIN) 300 MG capsule TAKE 1 CAPSULE BY MOUTH 2 TIMES DAILY. 180 capsule 2   GLOBAL EASE INJECT PEN NEEDLES 31G X 8 MM MISC USE TO INJECT INTO THE SKIN TWICE A DAY 100 each 1   glucose blood test strip  insulin aspart protamine- aspart (NOVOLOG MIX 70/30) (70-30) 100 UNIT/ML injection Inject 0.4-0.6 mLs (40-60 Units total) into the skin See admin instructions. Inject 60 units into the skin before breakfast and 40 units units before supper/evening meal 10 mL 0   Insulin Pen Needle (NOVOFINE) 30G X 8 MM MISC Inject 10 each into the skin as needed. E11.42 100 each 2   Lancets (ONETOUCH ULTRASOFT) lancets Use as instructed, Check blood glucose twice daily. 100 each 12   Multiple Vitamins-Minerals (PRESERVISION AREDS PO) Take 1 capsule by mouth 2 (two) times daily.     ONETOUCH VERIO test strip CHECK BLOOD SUGAR TWICE DAILY. 100 each 11   Current Facility-Administered Medications on File Prior to Visit  Medication Dose Route Frequency Provider Last Rate Last Admin   Insulin Pen Needle (NOVOFINE) 10 each  1 packet Subcutaneous PRN Cox, Kirsten, MD       Past Medical History:  Diagnosis Date   Abnormal liver function    Arthritis    Atrial fibrillation  (HCC) 08/12/2022   Eliquis   Coronary Artery Disease    s/p MI in 1997 followed by CABG // NSTEMI 08/2022 >> s/p DES to S-OM [L-LAD patent, S-Dx patent, S-RCA CTO] // Echo 2015: EF 45-50, inf HK // Echo 10/23: EF 55-60, no WMA   Diabetes mellitus without complication (HCC)    GERD (gastroesophageal reflux disease)    Hyperlipemia    Hypertension    MI (myocardial infarction) (HCC)    SCC (squamous cell carcinoma), arm, left    SCC (squamous cell carcinoma), arm, 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   Past Surgical History:  Procedure Laterality Date   ANKLE SURGERY  93,03   rt and lt    CARDIOVERSION N/A 10/20/2022   Procedure: CARDIOVERSION;  Surgeon: Wendall Stade, MD;  Location: [facility] ENDOSCOPY;  Service: Cardiovascular;  Laterality: N/A;   CHOLECYSTECTOMY  10/19/2012   Procedure: LAPAROSCOPIC CHOLECYSTECTOMY WITH INTRAOPERATIVE CHOLANGIOGRAM;  Surgeon: Axel Filler, MD;  Location: [facility];  Service: General;  Laterality: N/A;   COLONOSCOPY     CORONARY ARTERY BYPASS GRAFT  1997   CORONARY STENT INTERVENTION N/A 08/12/2022   Procedure: CORONARY STENT INTERVENTION;  Surgeon: Kathleene Hazel, MD;  Location: [facility];  Service: Cardiovascular;  Laterality: N/A;   ENDOSCOPIC RETROGRADE CHOLANGIOPANCREATOGRAPHY (ERCP) WITH PROPOFOL N/A 07/16/2021   Procedure: ENDOSCOPIC RETROGRADE CHOLANGIOPANCREATOGRAPHY (ERCP) WITH PROPOFOL;  Surgeon: Rachael Fee, MD;  Location: [facility] ENDOSCOPY;  Service: Endoscopy;  Laterality: N/A;   LEFT HEART CATH AND CORS/GRAFTS ANGIOGRAPHY N/A 08/12/2022   Procedure: LEFT HEART CATH AND CORS/GRAFTS ANGIOGRAPHY;  Surgeon: Kathleene Hazel, MD;  [facility]: [facility];  Service: Cardiovascular;  Laterality: N/A;   REMOVAL OF STONES  07/16/2021   Procedure: REMOVAL OF STONES;  Surgeon: Rachael Fee, MD;  Location: [facility]OS[facility];  Service: Endoscopy;;   SKIN CANCER EXCISION  12/2020   Squamous Cell Cancer   SPHINCTEROTOMY  07/16/2021    Procedure: SPHINCTEROTOMY;  Surgeon: Rachael Fee, MD;  Location: [facility];  Service: Endoscopy;;   TIBIA FRACTURE SURGERY      No family history on file. Social History   Socioeconomic History   Marital status: Married    Spouse name: Not on file   Number of children: Not on file   Years of education: Not on file   Highest education level: Not on file  Occupational History   Not on file  Tobacco Use   Smoking status: Former  Current packs/day: 0.00    Average packs/day: 1 pack/day for 20.0 years (20.0 ttl pk-yrs)    Types: Cigarettes    Start date: 10/19/1955    Quit date: 10/19/1975    Years since quitting: 48.2   Smokeless tobacco: Never   Tobacco comments:    Former smoker 11/11/22  Substance and Sexual Activity   Alcohol use: No   Drug use: No   Sexual activity: Not on file  Other Topics Concern   Not on file  Social History Narrative   Not on file   Social Drivers of Health   Financial Resource Strain: High Risk (09/15/2022)   Overall Financial Resource Strain (CARDIA)    Difficulty of Paying Living Expenses: Hard  Food Insecurity: No Food Insecurity (10/03/2023)   Hunger Vital Sign    Worried About Running Out of Food in the Last Year: Never true    Ran Out of Food in the Last Year: Never true  Transportation Needs: No Transportation Needs (10/03/2023)   PRAPARE - Administrator, Civil Service (Medical): No    Lack of Transportation (Non-Medical): No  Physical Activity: Sufficiently Active (10/03/2023)   Exercise Vital Sign    Days of Exercise per Week: 4 days    Minutes of Exercise per Session: 90 min  Stress: No Stress Concern Present (10/03/2023)   [employer] of Occupational Health - Occupational Stress Questionnaire    Feeling of Stress : Not at all  Social Connections: Socially Integrated (10/03/2023)   Social Connection and Isolation Panel [NHANES]    Frequency of Communication with Friends and Family: More than three times a  week    Frequency of Social Gatherings with Friends and Family: More than three times a week    Attends Religious Services: More than 4 times per year    Active Member of [employer] or Organizations: Yes    Attends Banker Meetings: 1 to 4 times per year    Marital Status: Married    Objective:  There were no vitals taken for this visit.     10/03/2023   11:44 AM 07/25/2023    9:47 AM 07/15/2023    9:34 AM  BP/Weight  Systolic BP 130 124 130  Diastolic BP 74 70 70  Wt. (Lbs) 247 239 241.8  BMI 32.59 kg/m2 31.53 kg/m2 31.9 kg/m2    Physical Exam  Diabetic Foot Exam - Simple   No data filed      Lab Results  Component Value Date   WBC 10.2 10/03/2023   HGB 17.0 10/03/2023   HCT 51.9 (H) 10/03/2023   PLT 158 10/03/2023   GLUCOSE 154 (H) 10/03/2023   CHOL 104 10/03/2023   TRIG 91 10/03/2023   HDL 38 (L) 10/03/2023   LDLCALC 48 10/03/2023   ALT 16 10/03/2023   AST 23 10/03/2023   NA 145 (H) 10/03/2023   K 4.8 10/03/2023   CL 105 10/03/2023   CREATININE 1.11 10/03/2023   BUN 22 10/03/2023   CO2 23 10/03/2023   TSH 4.100 05/18/2022   HGBA1C 7.7 (H) 10/03/2023   MICROALBUR 80 09/10/2021      Assessment & Plan:    Hypertensive heart disease without congestive heart failure  Diabetic polyneuropathy associated with type 2 diabetes mellitus (HCC)  Mixed hyperlipidemia     No orders of the defined types were placed in this encounter.   No orders of the defined types were placed in this encounter.    Follow-up: No  follow-ups on file.   I,Lenny Fiumara I Leal-Borjas,acting as a scribe for Blane Ohara, MD.,have documented all relevant documentation on the behalf of Blane Ohara, MD,as directed by  Blane Ohara, MD while in the presence of Blane Ohara, MD.   An After Visit Summary was printed and given to the patient.  Blane Ohara, MD [facility] [phone]

## 2024-01-03 ENCOUNTER — Ambulatory Visit: Payer: PPO | Admitting: Family Medicine

## 2024-01-03 DIAGNOSIS — E1142 Type 2 diabetes mellitus with diabetic polyneuropathy: Secondary | ICD-10-CM

## 2024-01-03 DIAGNOSIS — E782 Mixed hyperlipidemia: Secondary | ICD-10-CM

## 2024-01-03 DIAGNOSIS — I119 Hypertensive heart disease without heart failure: Secondary | ICD-10-CM

## 2024-01-07 NOTE — Progress Notes (Unsigned)
 This encounter was created in error - please disregard.

## 2024-01-18 DIAGNOSIS — R233 Spontaneous ecchymoses: Secondary | ICD-10-CM | POA: Diagnosis not present

## 2024-01-18 DIAGNOSIS — L570 Actinic keratosis: Secondary | ICD-10-CM | POA: Diagnosis not present

## 2024-01-24 ENCOUNTER — Telehealth: Payer: Self-pay | Admitting: Family Medicine

## 2024-01-24 ENCOUNTER — Other Ambulatory Visit: Payer: Self-pay

## 2024-01-24 DIAGNOSIS — E1165 Type 2 diabetes mellitus with hyperglycemia: Secondary | ICD-10-CM

## 2024-01-24 MED ORDER — DAPAGLIFLOZIN PROPANEDIOL 10 MG PO TABS
10.0000 mg | ORAL_TABLET | Freq: Every day | ORAL | 0 refills | Status: DC
Start: 2024-01-24 — End: 2024-01-24

## 2024-01-24 MED ORDER — DAPAGLIFLOZIN PROPANEDIOL 10 MG PO TABS: 10.0000 mg | ORAL_TABLET | Freq: Every day | ORAL | 0 refills | Status: DC

## 2024-01-24 NOTE — Telephone Encounter (Signed)
 Requested a 90 day supply refill on Farxiga to be sent to [facility].

## 2024-01-27 ENCOUNTER — Other Ambulatory Visit: Payer: Self-pay | Admitting: Family Medicine

## 2024-01-27 MED ORDER — EZETIMIBE 10 MG PO TABS: 10.0000 mg | ORAL_TABLET | Freq: Every day | ORAL | 0 refills | Status: DC

## 2024-01-27 MED ORDER — GABAPENTIN 300 MG PO CAPS: 300.0000 mg | ORAL_CAPSULE | Freq: Two times a day (BID) | ORAL | 2 refills | Status: AC

## 2024-01-27 NOTE — Telephone Encounter (Signed)
 Copied from CRM [id number]. Topic: Clinical - Medication Refill >> Jan 27, 2024 10:51 AM Carlatta H wrote: Most Recent Primary Care Visit:  Provider: [facility]  Department: [facility]  Visit Type: OFFICE VISIT  Date: 10/03/2023  Medication: ezetimibe (ZETIA) 10 MG tablet [[id number]] gabapentin (NEURONTIN) 300 MG capsule [[id number]]   Has the patient contacted their pharmacy? No (Agent: If no, request that the patient contact the pharmacy for the refill. If patient does not wish to contact the pharmacy document the reason why and proceed with request.) (Agent: If yes, when and what did the pharmacy advise?)  Is this the correct pharmacy for this prescription? Yes If no, delete pharmacy and type the correct one.  This is the patient's preferred pharmacy:    [facility] [address] [facility] - [place], [place] - [address] [place] [address] Phone: [phone] Fax: [phone]   Has the prescription been filled recently? No  Is the patient out of the medication? Yes  Has the patient been seen for an appointment in the last year OR does the patient have an upcoming appointment? No  Can we respond through MyChart? Yes  Agent: Please be advised that Rx refills may take up to 3 business days. We ask that you follow-up with your pharmacy.

## 2024-01-30 ENCOUNTER — Telehealth: Payer: Self-pay

## 2024-01-30 NOTE — Telephone Encounter (Signed)
 Called patient and left voicemail to call office back, needed more information about his leg pain. Wanted to make sure he didn't need to be seen before 4/9

## 2024-01-30 NOTE — Telephone Encounter (Signed)
 Patient called back and spoke with his wife offer him an appointment for this week with another provider. Patient denied and stated that he will wait to see Dr. Sedalia Shaffer appointment made for 02/06/24

## 2024-02-06 ENCOUNTER — Encounter: Payer: Self-pay | Admitting: Family Medicine

## 2024-02-06 ENCOUNTER — Ambulatory Visit (INDEPENDENT_AMBULATORY_CARE_PROVIDER_SITE_OTHER)
Admission: RE | Admit: 2024-02-06 | Discharge: 2024-02-06 | Disposition: A | Discharge status: Home / Self Care | Source: Ambulatory Visit | Attending: Family Medicine | Admitting: Family Medicine

## 2024-02-06 ENCOUNTER — Ambulatory Visit (INDEPENDENT_AMBULATORY_CARE_PROVIDER_SITE_OTHER): Admitting: Family Medicine

## 2024-02-06 VITALS — BP 130/82 | HR 88 | Temp 98.1°F | Ht 73.0 in | Wt 249.0 lb

## 2024-02-06 DIAGNOSIS — M79662 Pain in left lower leg: Secondary | ICD-10-CM | POA: Diagnosis not present

## 2024-02-06 DIAGNOSIS — M1712 Unilateral primary osteoarthritis, left knee: Secondary | ICD-10-CM | POA: Diagnosis not present

## 2024-02-06 DIAGNOSIS — S82202A Unspecified fracture of shaft of left tibia, initial encounter for closed fracture: Secondary | ICD-10-CM | POA: Diagnosis not present

## 2024-02-06 NOTE — Progress Notes (Signed)
 Acute Office Visit  Subjective:    Patient ID: Andre Shaffer, male    DOB: 1941/01/30, 83 y.o.   MRN: [id number]  Chief Complaint  Patient presents with   Leg Pain   Discussed the use of AI scribe software for clinical note transcription with the patient, who gave verbal consent to proceed.  HPI: Patient is in today for left leg pain x 1 month gradually worsening, pain starts a knee and goes down to his foot, pain comes and goes-"Sometimes I can get up and take a couple of steps and not needs a cane". Pain does not last long, no pain with sitting, some pain with standing and talking. The patient, with a history of left leg surgery in 2003 during which he had a rod placed, presents with increasing pain and instability over the past month. The discomfort is variable, sometimes presenting in the muscle behind the knee, sometimes in the middle of the leg, and often at the ankle. The patient describes the sensation as if there is a 'screw loose' and the leg occasionally 'pops.' The patient denies any recent falls.         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 // NSTEMI 08/2022 >> s/p DES to S-OM [L-LAD patent, S-Dx patent, S-RCA CTO] // Echo 2015: EF 45-50, inf HK // Echo 10/23: EF 55-60, no WMA   Diabetes mellitus without complication (HCC)    GERD (gastroesophageal reflux disease)    Hyperlipemia    Hypertension    MI (myocardial infarction) (HCC)    SCC (squamous cell carcinoma), arm, left    SCC (squamous cell carcinoma), arm, 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    Past Surgical History:  Procedure Laterality Date   ANKLE SURGERY  93,03   rt and lt    CARDIOVERSION N/A 10/20/2022   Procedure: CARDIOVERSION;  Surgeon: Wendall Stade, MD;  Location: [facility];  Service: Cardiovascular;  Laterality: N/A;   CHOLECYSTECTOMY  10/19/2012    Procedure: LAPAROSCOPIC CHOLECYSTECTOMY WITH INTRAOPERATIVE CHOLANGIOGRAM;  Surgeon: Axel Filler, MD;  Location: [facility];  Service: General;  Laterality: N/A;   COLONOSCOPY     CORONARY ARTERY BYPASS GRAFT  1997   CORONARY STENT INTERVENTION N/A 08/12/2022   Procedure: CORONARY STENT INTERVENTION;  Surgeon: Kathleene Hazel, MD;  Location: [facility];  Service: Cardiovascular;  Laterality: N/A;   ENDOSCOPIC RETROGRADE CHOLANGIOPANCREATOGRAPHY (ERCP) WITH PROPOFOL N/A 07/16/2021   Procedure: ENDOSCOPIC RETROGRADE CHOLANGIOPANCREATOGRAPHY (ERCP) WITH PROPOFOL;  Surgeon: Rachael Fee, MD;  Location: [facility];  Service: Endoscopy;  Laterality: N/A;   LEFT HEART CATH AND CORS/GRAFTS ANGIOGRAPHY N/A 08/12/2022   Procedure: LEFT HEART CATH AND CORS/GRAFTS ANGIOGRAPHY;  Surgeon: Kathleene Hazel, MD;  Location: [facility];  Service: Cardiovascular;  Laterality: N/A;   REMOVAL OF STONES  07/16/2021   Procedure: REMOVAL OF STONES;  Surgeon: Rachael Fee, MD;  Location: [facility];  Service: Endoscopy;;   SKIN CANCER EXCISION  12/2020   Squamous Cell Cancer   SPHINCTEROTOMY  07/16/2021   Procedure: SPHINCTEROTOMY;  Surgeon: Rachael Fee, MD;  Location: [facility];  Service: Endoscopy;;   TIBIA FRACTURE SURGERY      History reviewed. No pertinent family history.  Social History   Socioeconomic History   Marital status: Married  Spouse name: Not on file   Number of children: Not on file   Years of education: Not on file   Highest education level: Not on file  Occupational History   Not on file  Tobacco Use   Smoking status: Former    Current packs/day: 0.00    Average packs/day: 1 pack/day for 20.0 years (20.0 ttl pk-yrs)    Types: Cigarettes    Start date: 10/19/1955    Quit date: 10/19/1975    Years since quitting: 48.3   Smokeless tobacco: Never   Tobacco comments:    Former smoker 11/11/22  Substance and Sexual  Activity   Alcohol use: No   Drug use: No   Sexual activity: Not on file  Other Topics Concern   Not on file  Social History Narrative   Not on file   Social Drivers of Health   Financial Resource Strain: High Risk (09/15/2022)   Overall Financial Resource Strain (CARDIA)    Difficulty of Paying Living Expenses: Hard  Food Insecurity: No Food Insecurity (10/03/2023)   Hunger Vital Sign    Worried About Running Out of Food in the Last Year: Never true    Ran Out of Food in the Last Year: Never true  Transportation Needs: No Transportation Needs (10/03/2023)   PRAPARE - Administrator, Civil Service (Medical): No    Lack of Transportation (Non-Medical): No  Physical Activity: Sufficiently Active (10/03/2023)   Exercise Vital Sign    Days of Exercise per Week: 4 days    Minutes of Exercise per Session: 90 min  Stress: No Stress Concern Present (10/03/2023)   [employer] of Occupational Health - Occupational Stress Questionnaire    Feeling of Stress : Not at all  Social Connections: Socially Integrated (10/03/2023)   Social Connection and Isolation Panel [NHANES]    Frequency of Communication with Friends and Family: More than three times a week    Frequency of Social Gatherings with Friends and Family: More than three times a week    Attends Religious Services: More than 4 times per year    Active Member of [employer] or Organizations: Yes    Attends Banker Meetings: 1 to 4 times per year    Marital Status: Married  Catering manager Violence: Not At Risk (10/03/2023)   Humiliation, Afraid, Rape, and Kick questionnaire    Fear of Current or Ex-Partner: No    Emotionally Abused: No    Physically Abused: No    Sexually Abused: No    Outpatient Medications Prior to Visit  Medication Sig Dispense Refill   acetaminophen (TYLENOL) 500 MG tablet Take 500-1,000 mg by mouth every 6 (six) hours as needed (pain.).     apixaban (ELIQUIS) 5 MG TABS tablet TAKE 1  TABLET BY MOUTH TWICE DAILY. 60 tablet 5   atorvastatin (LIPITOR) 20 MG tablet TAKE 1 TABLET BY MOUTH DAILY. 90 tablet 1   Blood Glucose Monitoring Suppl (ONETOUCH VERIO) w/Device KIT Use as instructed. Check blood glucose twice daily. E11.42 1 kit 0   clopidogrel (PLAVIX) 75 MG tablet TAKE 1 TABLET BY MOUTH EVERY DAY WITH BREAKFAST 30 tablet 6   Cyanocobalamin 1000 MCG/ML LIQD Place 2,500 mcg under the tongue in the morning. Place 0.5 dropperful (2,500 mcg) sublingually daily     dapagliflozin propanediol (FARXIGA) 10 MG TABS tablet Take 1 tablet (10 mg total) by mouth daily. 90 tablet 0   ezetimibe (ZETIA) 10 MG tablet Take 1 tablet (10 mg  total) by mouth daily. 90 tablet 0   gabapentin (NEURONTIN) 300 MG capsule Take 1 capsule (300 mg total) by mouth 2 (two) times daily. 180 capsule 2   GLOBAL EASE INJECT PEN NEEDLES 31G X 8 MM MISC USE TO INJECT INTO THE SKIN TWICE A DAY 100 each 1   glucose blood test strip      insulin aspart protamine- aspart (NOVOLOG MIX 70/30) (70-30) 100 UNIT/ML injection Inject 0.4-0.6 mLs (40-60 Units total) into the skin See admin instructions. Inject 60 units into the skin before breakfast and 40 units units before supper/evening meal 10 mL 0   Insulin Pen Needle (NOVOFINE) 30G X 8 MM MISC Inject 10 each into the skin as needed. E11.42 100 each 2   Lancets (ONETOUCH ULTRASOFT) lancets Use as instructed, Check blood glucose twice daily. 100 each 12   Multiple Vitamins-Minerals (PRESERVISION AREDS PO) Take 1 capsule by mouth 2 (two) times daily.     ONETOUCH VERIO test strip CHECK BLOOD SUGAR TWICE DAILY. 100 each 11   Facility-Administered Medications Prior to Visit  Medication Dose Route Frequency Provider Last Rate Last Admin   Insulin Pen Needle (NOVOFINE) 10 each  1 packet Subcutaneous PRN Karynn Deblasi, MD        Allergies  Allergen Reactions   Nitroglycerin Other (See Comments)    Blood pressure 'bottomed out'   Statins Other (See Comments)    Elevation of  liver enzymes   Tetanus Toxoid Other (See Comments)    Severe flu symptoms fever and chills   Penicillins Rash    Review of Systems  Constitutional:  Negative for chills and fever.  HENT:  Negative for congestion and sore throat.   Musculoskeletal:        Left leg pain       Objective:        02/06/2024    9:38 AM 10/03/2023   11:44 AM 07/25/2023    9:47 AM  Vitals with BMI  Height 6\' 1"  6\' 1"  6\' 1"   Weight 249 lbs 247 lbs 239 lbs  BMI 32.86 32.59 31.54  Systolic 130 130 098  Diastolic 82 74 70  Pulse 88 84 68    No data found.   Physical Exam Vitals reviewed.  Constitutional:      Appearance: Normal appearance.  Musculoskeletal:        General: Tenderness (mid lateral lower leg.) present. No swelling or deformity.  Neurological:     Mental Status: He is alert.     Health Maintenance Due  Topic Date Due   Zoster Vaccines- Shingrix (1 of 2) Never done   Medicare Annual Wellness (AWV)  06/01/2017   FOOT EXAM  05/19/2023   COVID-19 Vaccine (5 - 2024-25 season) 07/03/2023   Diabetic kidney evaluation - Urine ACR  03/06/2024    There are no preventive care reminders to display for this patient.   Lab Results  Component Value Date   TSH 4.100 05/18/2022   Lab Results  Component Value Date   WBC 10.2 10/03/2023   HGB 17.0 10/03/2023   HCT 51.9 (H) 10/03/2023   MCV 100 (H) 10/03/2023   PLT 158 10/03/2023   Lab Results  Component Value Date   NA 145 (H) 10/03/2023   K 4.8 10/03/2023   CO2 23 10/03/2023   GLUCOSE 154 (H) 10/03/2023   BUN 22 10/03/2023   CREATININE 1.11 10/03/2023   BILITOT 0.7 10/03/2023   ALKPHOS 120 10/03/2023   AST 23 10/03/2023  ALT 16 10/03/2023   PROT 6.4 10/03/2023   ALBUMIN 4.0 10/03/2023   CALCIUM 9.4 10/03/2023   ANIONGAP 8 08/13/2022   EGFR 65 10/03/2023   Lab Results  Component Value Date   CHOL 104 10/03/2023   Lab Results  Component Value Date   HDL 38 (L) 10/03/2023   Lab Results  Component Value Date    LDLCALC 48 10/03/2023   Lab Results  Component Value Date   TRIG 91 10/03/2023   Lab Results  Component Value Date   CHOLHDL 2.7 10/03/2023   Lab Results  Component Value Date   HGBA1C 7.7 (H) 10/03/2023       Assessment & Plan:  Pain of left lower leg -     DG Tibia/Fibula Left; Future   Order stat X-ray of the left leg to assess for hardware issues or other abnormalities. - He can go to the new med center on [address] for imaging. No appointment needed, open currently.       No orders of the defined types were placed in this encounter.   Orders Placed This Encounter  Procedures   DG Tibia/Fibula Left     Follow-up: Return if symptoms worsen or fail to improve and based on xray results..  An After Visit Summary was printed and given to the patient.  I attest that I have reviewed this visit and agree with the plan scribed by my staff.   Blane Ohara, MD [facility] [phone]

## 2024-02-08 ENCOUNTER — Ambulatory Visit: Admitting: Family Medicine

## 2024-02-09 ENCOUNTER — Other Ambulatory Visit: Payer: Self-pay

## 2024-02-09 DIAGNOSIS — M79662 Pain in left lower leg: Secondary | ICD-10-CM

## 2024-02-22 NOTE — Progress Notes (Unsigned)
 Subjective:  Patient ID: Andre Shaffer, male    DOB: 1941-04-25  Age: 83 y.o. MRN: [id number]  Chief Complaint  Patient presents with   Medical Management of Chronic Issues    HPI:  Diabetes:  Complications: neuropathy.  Glucose checking: Checks sugars fasting daily Glucose logs: 96-160 Hypoglycemia: none  Most recent A1C: 7.7% Current medications:  Novolog  insulin  injection 70/30 60 U in am and 45 U before supper, Gabapentin  300 mg 1 capsule twice daily, farxiga  10 mg daily Last Eye Exam: Eye exam in Jan or Feb 2025 Foot checks: daily.    Hyperlipidemia: Current medications: Atorvastatin  20 mg daily. Zetia  10 mg    Hypertension/CAD: Current medications: none. Had discontinued due to bp and pulse being low.  Diet: healthy. On Plavix . Sees Dr. Nishan annually.   Atrial Fibrillation: on eliquis  5 mg twice daily.     02/23/2024    8:48 AM 07/25/2023    9:53 AM 06/22/2023    9:50 AM 06/09/2023   11:47 AM 12/02/2022   10:49 AM  Depression screen PHQ 2/9  Decreased Interest 0 0 0 0 0  Down, Depressed, Hopeless 0 0 0 0 0  PHQ - 2 Score 0 0 0 0 0  Altered sleeping  0 0    Tired, decreased energy  0 0    Change in appetite  0 0    Feeling bad or failure about yourself   0 0    Trouble concentrating  0 0    Moving slowly or fidgety/restless  0 0    Suicidal thoughts  0 0    PHQ-9 Score  0 0    Difficult doing work/chores  Not difficult at all Not difficult at all          02/23/2024    9:00 AM  Fall Risk   Falls in the past year? 0  Number falls in past yr: 0  Injury with Fall? 0  Risk for fall due to : History of fall(s)  Follow up Falls prevention discussed;Falls evaluation completed    Patient Care Team: Mercy Stall, MD as PCP - General (Family Medicine) Loyde Rule, MD as PCP - Cardiology (Cardiology) Enriqueta Harvey, RN as Case Manager (General Practice) Devon Fogo, [facility] (Inactive) (Pharmacist)   Review of Systems  Constitutional:  Negative for  chills, diaphoresis, fatigue and fever.  HENT:  Negative for congestion, ear pain and sore throat.   Respiratory:  Negative for cough and shortness of breath.   Cardiovascular:  Negative for chest pain and leg swelling.  Gastrointestinal:  Negative for abdominal pain, constipation, diarrhea, nausea and vomiting.  Genitourinary:  Negative for dysuria and urgency.  Musculoskeletal:  Negative for arthralgias and myalgias.  Neurological:  Negative for dizziness and headaches.  Psychiatric/Behavioral:  Negative for dysphoric mood.     Current Outpatient Medications on File Prior to Visit  Medication Sig Dispense Refill   acetaminophen  (TYLENOL ) 500 MG tablet Take 500-1,000 mg by mouth every 6 (six) hours as needed (pain.).     apixaban  (ELIQUIS ) 5 MG TABS tablet TAKE 1 TABLET BY MOUTH TWICE DAILY. 60 tablet 5   atorvastatin  (LIPITOR) 20 MG tablet TAKE 1 TABLET BY MOUTH DAILY. 90 tablet 1   Blood Glucose Monitoring Suppl (ONETOUCH VERIO) w/Device KIT Use as instructed. Check blood glucose twice daily. E11.42 1 kit 0   clopidogrel  (PLAVIX ) 75 MG tablet TAKE 1 TABLET BY MOUTH EVERY DAY WITH BREAKFAST 30 tablet 6  Cyanocobalamin 1000 MCG/ML LIQD Place 2,500 mcg under the tongue in the morning. Place 0.5 dropperful (2,500 mcg) sublingually daily     dapagliflozin  propanediol (FARXIGA ) 10 MG TABS tablet Take 1 tablet (10 mg total) by mouth daily. 90 tablet 0   ezetimibe  (ZETIA ) 10 MG tablet Take 1 tablet (10 mg total) by mouth daily. 90 tablet 0   gabapentin  (NEURONTIN ) 300 MG capsule Take 1 capsule (300 mg total) by mouth 2 (two) times daily. 180 capsule 2   GLOBAL EASE INJECT PEN NEEDLES 31G X 8 MM MISC USE TO INJECT INTO THE SKIN TWICE A DAY 100 each 1   glucose blood test strip      insulin  aspart protamine- aspart (NOVOLOG  MIX 70/30) (70-30) 100 UNIT/ML injection Inject 0.4-0.6 mLs (40-60 Units total) into the skin See admin instructions. Inject 60 units into the skin before breakfast and 40 units  units before supper/evening meal 10 mL 0   Insulin  Pen Needle (NOVOFINE) 30G X 8 MM MISC Inject 10 each into the skin as needed. E11.42 100 each 2   Lancets (ONETOUCH ULTRASOFT) lancets Use as instructed, Check blood glucose twice daily. 100 each 12   Multiple Vitamins-Minerals (PRESERVISION AREDS PO) Take 1 capsule by mouth 2 (two) times daily.     ONETOUCH VERIO test strip CHECK BLOOD SUGAR TWICE DAILY. 100 each 11   Current Facility-Administered Medications on File Prior to Visit  Medication Dose Route Frequency Provider Last Rate Last Admin   Insulin  Pen Needle (NOVOFINE) 10 each  1 packet Subcutaneous PRN Serita Degroote, MD       Past Medical History:  Diagnosis Date   Abnormal liver function    Arthritis    Atrial fibrillation (HCC) 08/12/2022   Eliquis    Coronary Artery Disease    s/p MI in 1997 followed by CABG // NSTEMI 08/2022 >> s/p DES to S-OM [L-LAD patent, S-Dx patent, S-RCA CTO] // Echo 2015: EF 45-50, inf HK // Echo 10/23: EF 55-60, no WMA   Diabetes mellitus without complication (HCC)    GERD (gastroesophageal reflux disease)    Hyperlipemia    Hypertension    MI (myocardial infarction) (HCC)    SCC (squamous cell carcinoma), arm, left    SCC (squamous cell carcinoma), arm, 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   Past Surgical History:  Procedure Laterality Date   ANKLE SURGERY  93,03   rt and lt    CARDIOVERSION N/A 10/20/2022   Procedure: CARDIOVERSION;  Surgeon: Loyde Rule, MD;  Location: [facility];  Service: Cardiovascular;  Laterality: N/A;   CHOLECYSTECTOMY  10/19/2012   Procedure: LAPAROSCOPIC CHOLECYSTECTOMY WITH INTRAOPERATIVE CHOLANGIOGRAM;  Surgeon: Shela Derby, MD;  Location: [facility];  Service: General;  Laterality: N/A;   COLONOSCOPY     CORONARY ARTERY BYPASS GRAFT  1997   CORONARY STENT INTERVENTION N/A 08/12/2022   Procedure: CORONARY STENT INTERVENTION;  Surgeon: Odie Benne, MD;  Location: [facility];  Service:  Cardiovascular;  Laterality: N/A;   ENDOSCOPIC RETROGRADE CHOLANGIOPANCREATOGRAPHY (ERCP) WITH PROPOFOL  N/A 07/16/2021   Procedure: ENDOSCOPIC RETROGRADE CHOLANGIOPANCREATOGRAPHY (ERCP) WITH PROPOFOL ;  Surgeon: Janel Medford, MD;  Location: [facility];  Service: Endoscopy;  Laterality: N/A;   LEFT HEART CATH AND CORS/GRAFTS ANGIOGRAPHY N/A 08/12/2022   Procedure: LEFT HEART CATH AND CORS/GRAFTS ANGIOGRAPHY;  Surgeon: Odie Benne, MD;  Location: [facility];  Service: Cardiovascular;  Laterality: N/A;   REMOVAL OF STONES  07/16/2021   Procedure: REMOVAL OF STONES;  Surgeon: Howard Macho,  Arleen Lacer, MD;  Location: [facility];  Service: Endoscopy;;   SKIN CANCER EXCISION  12/2020   Squamous Cell Cancer   SPHINCTEROTOMY  07/16/2021   Procedure: SPHINCTEROTOMY;  Surgeon: Janel Medford, MD;  Location: [facility];  Service: Endoscopy;;   TIBIA FRACTURE SURGERY      History reviewed. No pertinent family history. Social History   Socioeconomic History   Marital status: Married    Spouse name: Not on file   Number of children: Not on file   Years of education: Not on file   Highest education level: Not on file  Occupational History   Not on file  Tobacco Use   Smoking status: Former    Current packs/day: 0.00    Average packs/day: 1 pack/day for 20.0 years (20.0 ttl pk-yrs)    Types: Cigarettes    Start date: 10/19/1955    Quit date: 10/19/1975    Years since quitting: 48.3   Smokeless tobacco: Never   Tobacco comments:    Former smoker 11/11/22  Substance and Sexual Activity   Alcohol use: No   Drug use: No   Sexual activity: Not on file  Other Topics Concern   Not on file  Social History Narrative   Not on file   Social Drivers of Health   Financial Resource Strain: Low Risk  (02/23/2024)   Overall Financial Resource Strain (CARDIA)    Difficulty of Paying Living Expenses: Not hard at all  Food Insecurity: No Food Insecurity (10/03/2023)   Hunger Vital  Sign    Worried About Running Out of Food in the Last Year: Never true    Ran Out of Food in the Last Year: Never true  Transportation Needs: No Transportation Needs (10/03/2023)   PRAPARE - Administrator, Civil Service (Medical): No    Lack of Transportation (Non-Medical): No  Physical Activity: Insufficiently Active (02/23/2024)   Exercise Vital Sign    Days of Exercise per Week: 3 days    Minutes of Exercise per Session: 20 min  Stress: No Stress Concern Present (10/03/2023)   [employer] of Occupational Health - Occupational Stress Questionnaire    Feeling of Stress : Not at all  Social Connections: Socially Integrated (10/03/2023)   Social Connection and Isolation Panel [NHANES]    Frequency of Communication with Friends and Family: More than three times a week    Frequency of Social Gatherings with Friends and Family: More than three times a week    Attends Religious Services: More than 4 times per year    Active Member of [employer] or Organizations: Yes    Attends Banker Meetings: 1 to 4 times per year    Marital Status: Married    Objective:  BP 124/78   Pulse 64   Temp 97.8 F (36.6 C)   Ht 6\' 1"  (1.854 m)   Wt 248 lb (112.5 kg)   SpO2 98%   BMI 32.72 kg/m      02/23/2024    8:24 AM 02/06/2024    9:38 AM 10/03/2023   11:44 AM  BP/Weight  Systolic BP 124 130 130  Diastolic BP 78 82 74  Wt. (Lbs) 248 249 247  BMI 32.72 kg/m2 32.85 kg/m2 32.59 kg/m2    Physical Exam Vitals reviewed.  Constitutional:      Appearance: Normal appearance. He is obese.  Neck:     Vascular: No carotid bruit.  Cardiovascular:     Rate and Rhythm: Normal rate  and regular rhythm.     Pulses: Normal pulses.     Heart sounds: Normal heart sounds.  Pulmonary:     Effort: Pulmonary effort is normal.     Breath sounds: Normal breath sounds. No wheezing, rhonchi or rales.  Abdominal:     General: Bowel sounds are normal.     Palpations: Abdomen is soft.      Tenderness: There is no abdominal tenderness.  Neurological:     Mental Status: He is alert and oriented to person, place, and time.  Psychiatric:        Mood and Affect: Mood normal.        Behavior: Behavior normal.     Diabetic Foot Exam - Simple   Simple Foot Form  02/23/2024  9:38 PM  Visual Inspection Sensation Testing Pulse Check Comments Deferred. Patient was seen by podiatry 2 days ago.      Lab Results  Component Value Date   WBC 10.2 10/03/2023   HGB 17.0 10/03/2023   HCT 51.9 (H) 10/03/2023   PLT 158 10/03/2023   GLUCOSE 154 (H) 10/03/2023   CHOL 104 10/03/2023   TRIG 91 10/03/2023   HDL 38 (L) 10/03/2023   LDLCALC 48 10/03/2023   ALT 16 10/03/2023   AST 23 10/03/2023   NA 145 (H) 10/03/2023   K 4.8 10/03/2023   CL 105 10/03/2023   CREATININE 1.11 10/03/2023   BUN 22 10/03/2023   CO2 23 10/03/2023   TSH 4.100 05/18/2022   HGBA1C 7.7 (H) 10/03/2023   MICROALBUR 80 09/10/2021      Assessment & Plan:  Hypertensive heart disease without congestive heart failure Assessment & Plan: Stable. No antihypertensives.  Continue farxiga  10 mg daily.  Continue plavix .  Orders: -     CBC with Differential/Platelet -     Comprehensive metabolic panel with GFR  Type 2 diabetes mellitus with hyperglycemia, with long-term current use of insulin  (HCC) Assessment & Plan: Diabetes improved. Continue: Farxiga  10mg  daily and NovoLog  70/30 (60 units in the morning and 45 units before supper). Patient also takes Gabapentin  300mg  twice daily for neuropathy. Recommend start on ozempic.  -Continue current medication regimen. -Check HbA1c today.  Orders: -     Hemoglobin A1c -     Microalbumin / creatinine urine ratio  Mixed hyperlipidemia Assessment & Plan: Well controlled.  No changes to medicines. Atorvastatin  20 mg daily, zetia  10 mg daily Continue to work on eating a healthy diet and exercise.    Orders: -     Lipid panel  Class 1 obesity due to excess  calories with serious comorbidity and body mass index (BMI) of 32.0 to 32.9 in adult Assessment & Plan: Recommend continue to work on eating healthy diet and exercise. Comorbidity: diabetes.   Diabetic polyneuropathy associated with type 2 diabetes mellitus (HCC) Assessment & Plan: Control: not at goal Recommend check sugars fasting daily. Recommend check feet daily. Recommend annual eye exams. Medicines: Continue Novolog  insulin  injection 70/30 60 U in am and 40 U before supper, farxiga  10 mg daily, and gabapentin . Continue to work on eating a healthy diet and exercise.  Labs drawn today   B12 deficiency Assessment & Plan: The current medical regimen is effective;  continue present plan and medications.  Continue SL b12 2500 mg daily.    Paroxysmal atrial fibrillation (HCC) Assessment & Plan: Well controlled. Continue eliquis .       No orders of the defined types were placed in this encounter.  Orders Placed This Encounter  Procedures   CBC with Differential/Platelet   Comprehensive metabolic panel with GFR   Hemoglobin A1c   Lipid panel   Microalbumin / creatinine urine ratio     Follow-up: Return in about 3 months (around 05/24/2024) for chronic follow up.   I,Marla I Leal-Borjas,acting as a scribe for Mercy Stall, MD.,have documented all relevant documentation on the behalf of Mercy Stall, MD,as directed by  Mercy Stall, MD while in the presence of Mercy Stall, MD.   An After Visit Summary was printed and given to the patient.  I attest that I have reviewed this visit and agree with the plan scribed by my staff.   Mercy Stall, MD [facility] [phone]

## 2024-02-23 ENCOUNTER — Encounter: Payer: Self-pay | Admitting: Family Medicine

## 2024-02-23 ENCOUNTER — Ambulatory Visit (INDEPENDENT_AMBULATORY_CARE_PROVIDER_SITE_OTHER): Admitting: Family Medicine

## 2024-02-23 VITALS — BP 124/78 | HR 64 | Temp 97.8°F | Ht 73.0 in | Wt 248.0 lb

## 2024-02-23 DIAGNOSIS — E66811 Obesity, class 1: Secondary | ICD-10-CM | POA: Diagnosis not present

## 2024-02-23 DIAGNOSIS — E1142 Type 2 diabetes mellitus with diabetic polyneuropathy: Secondary | ICD-10-CM | POA: Diagnosis not present

## 2024-02-23 DIAGNOSIS — E1165 Type 2 diabetes mellitus with hyperglycemia: Secondary | ICD-10-CM | POA: Diagnosis not present

## 2024-02-23 DIAGNOSIS — E6609 Other obesity due to excess calories: Secondary | ICD-10-CM

## 2024-02-23 DIAGNOSIS — Z794 Long term (current) use of insulin: Secondary | ICD-10-CM

## 2024-02-23 DIAGNOSIS — E538 Deficiency of other specified B group vitamins: Secondary | ICD-10-CM

## 2024-02-23 DIAGNOSIS — E782 Mixed hyperlipidemia: Secondary | ICD-10-CM

## 2024-02-23 DIAGNOSIS — I480 Paroxysmal atrial fibrillation: Secondary | ICD-10-CM

## 2024-02-23 DIAGNOSIS — I119 Hypertensive heart disease without heart failure: Secondary | ICD-10-CM | POA: Diagnosis not present

## 2024-02-23 DIAGNOSIS — Z6832 Body mass index (BMI) 32.0-32.9, adult: Secondary | ICD-10-CM

## 2024-02-23 NOTE — Assessment & Plan Note (Signed)
Well controlled.  No changes to medicines. Atorvastatin 20 mg daily, zetia 10 mg daily Continue to work on eating a healthy diet and exercise.

## 2024-02-23 NOTE — Assessment & Plan Note (Signed)
 Stable. No antihypertensives.  Continue farxiga 10 mg daily.  Continue plavix.

## 2024-02-23 NOTE — Assessment & Plan Note (Signed)
 Well controlled. Continue eliquis.

## 2024-02-23 NOTE — Assessment & Plan Note (Signed)
 Diabetes improved. Continue: Farxiga  10mg  daily and NovoLog  70/30 (60 units in the morning and 45 units before supper). Patient also takes Gabapentin  300mg  twice daily for neuropathy. Recommend start on ozempic.  -Continue current medication regimen. -Check HbA1c today.

## 2024-02-23 NOTE — Assessment & Plan Note (Signed)
The current medical regimen is effective;  continue present plan and medications. Continue SL b12 2500 mg daily.

## 2024-02-23 NOTE — Assessment & Plan Note (Signed)
 Recommend continue to work on eating healthy diet and exercise. Comorbidity: diabetes.

## 2024-02-23 NOTE — Assessment & Plan Note (Deleted)
Control: not at goal Recommend check sugars fasting daily. Recommend check feet daily. Recommend annual eye exams. Medicines: Continue Novolog insulin injection 70/30 60 U in am and 40 U before supper, farxiga 10 mg daily, and gabapentin. Continue to work on eating a healthy diet and exercise.  Labs drawn today

## 2024-02-23 NOTE — Assessment & Plan Note (Signed)
Control: not at goal Recommend check sugars fasting daily. Recommend check feet daily. Recommend annual eye exams. Medicines: Continue Novolog insulin injection 70/30 60 U in am and 40 U before supper, farxiga 10 mg daily, and gabapentin. Continue to work on eating a healthy diet and exercise.  Labs drawn today

## 2024-02-24 LAB — COMPREHENSIVE METABOLIC PANEL WITH GFR
ALT: 16 IU/L (ref 0–44)
AST: 18 IU/L (ref 0–40)
Albumin: 4.1 g/dL (ref 3.7–4.7)
Alkaline Phosphatase: 133 IU/L — ABNORMAL HIGH (ref 44–121)
BUN/Creatinine Ratio: 15 (ref 10–24)
BUN: 19 mg/dL (ref 8–27)
Bilirubin Total: 0.7 mg/dL (ref 0.0–1.2)
CO2: 21 mmol/L (ref 20–29)
Calcium: 9.5 mg/dL (ref 8.6–10.2)
Chloride: 105 mmol/L (ref 96–106)
Creatinine, Ser: 1.29 mg/dL — ABNORMAL HIGH (ref 0.76–1.27)
Globulin, Total: 2.4 g/dL (ref 1.5–4.5)
Glucose: 133 mg/dL — ABNORMAL HIGH (ref 70–99)
Potassium: 4.5 mmol/L (ref 3.5–5.2)
Sodium: 140 mmol/L (ref 134–144)
Total Protein: 6.5 g/dL (ref 6.0–8.5)
eGFR: 55 mL/min/{1.73_m2} — ABNORMAL LOW (ref >59)

## 2024-02-24 LAB — CBC WITH DIFFERENTIAL/PLATELET
Basophils Absolute: 0 10*3/uL (ref 0.0–0.2)
Basos: 1 %
EOS (ABSOLUTE): 0.2 10*3/uL (ref 0.0–0.4)
Eos: 2 %
Hematocrit: 48 % (ref 37.5–51.0)
Hemoglobin: 16.3 g/dL (ref 13.0–17.7)
Immature Grans (Abs): 0 10*3/uL (ref 0.0–0.1)
Immature Granulocytes: 0 %
Lymphocytes Absolute: 1.1 10*3/uL (ref 0.7–3.1)
Lymphs: 13 %
MCH: 32.7 pg (ref 26.6–33.0)
MCHC: 34 g/dL (ref 31.5–35.7)
MCV: 96 fL (ref 79–97)
Monocytes Absolute: 0.5 10*3/uL (ref 0.1–0.9)
Monocytes: 6 %
Neutrophils Absolute: 6.4 10*3/uL (ref 1.4–7.0)
Neutrophils: 78 %
Platelets: 147 10*3/uL — ABNORMAL LOW (ref 150–450)
RBC: 4.98 x10E6/uL (ref 4.14–5.80)
RDW: 13 % (ref 11.6–15.4)
WBC: 8.2 10*3/uL (ref 3.4–10.8)

## 2024-02-24 LAB — MICROALBUMIN / CREATININE URINE RATIO
Creatinine, Urine: 93 mg/dL
Microalb/Creat Ratio: 8 mg/g{creat} (ref 0–29)
Microalbumin, Urine: 7.8 ug/mL

## 2024-02-24 LAB — LIPID PANEL
Chol/HDL Ratio: 2.7 ratio (ref 0.0–5.0)
Cholesterol, Total: 90 mg/dL — ABNORMAL LOW (ref 100–199)
HDL: 33 mg/dL — ABNORMAL LOW (ref >39)
LDL Chol Calc (NIH): 40 mg/dL (ref 0–99)
Triglycerides: 83 mg/dL (ref 0–149)
VLDL Cholesterol Cal: 17 mg/dL (ref 5–40)

## 2024-02-24 LAB — HEMOGLOBIN A1C
Est. average glucose Bld gHb Est-mCnc: 166 mg/dL
Hgb A1c MFr Bld: 7.4 % — ABNORMAL HIGH (ref 4.8–5.6)

## 2024-02-26 ENCOUNTER — Encounter: Payer: Self-pay | Admitting: Family Medicine

## 2024-02-28 ENCOUNTER — Other Ambulatory Visit: Payer: Self-pay | Admitting: Family Medicine

## 2024-03-13 DIAGNOSIS — M1732 Unilateral post-traumatic osteoarthritis, left knee: Secondary | ICD-10-CM | POA: Diagnosis not present

## 2024-03-20 DIAGNOSIS — M1732 Unilateral post-traumatic osteoarthritis, left knee: Secondary | ICD-10-CM | POA: Diagnosis not present

## 2024-05-02 LAB — HM DIABETES EYE EXAM

## 2024-05-14 ENCOUNTER — Telehealth: Payer: Self-pay | Admitting: Family Medicine

## 2024-05-14 ENCOUNTER — Other Ambulatory Visit: Payer: Self-pay | Admitting: Family Medicine

## 2024-05-14 DIAGNOSIS — E1165 Type 2 diabetes mellitus with hyperglycemia: Secondary | ICD-10-CM

## 2024-05-14 MED ORDER — ATORVASTATIN CALCIUM 20 MG PO TABS: 20.0000 mg | ORAL_TABLET | Freq: Every day | ORAL | 1 refills | Status: DC

## 2024-05-14 MED ORDER — EZETIMIBE 10 MG PO TABS: 10.0000 mg | ORAL_TABLET | Freq: Every day | ORAL | 0 refills | Status: DC

## 2024-05-14 NOTE — Telephone Encounter (Signed)
 Copied from CRM [id number]. Topic: Clinical - Medication Refill >> May 14, 2024  8:57 AM Montie POUR wrote: Medication: atorvastatin  (LIPITOR) 20 MG tablet AND ezetimibe  (ZETIA ) 10 MG tablet  90 Day supply   Has the patient contacted their pharmacy? Yes (Agent: If no, request that the patient contact the pharmacy for the refill. If patient does not wish to contact the pharmacy document the reason why and proceed with request.) (Agent: If yes, when and what did the pharmacy advise?) Pharmacy needs order to refill  This is the patient's preferred pharmacy:  [facility] [address] - [place], [place] - [address] [place] [address] Phone: [phone] Fax: [phone]  Is this the correct pharmacy for this prescription? Yes If no, delete pharmacy and type the correct one.   Has the prescription been filled recently? No  Is the patient out of the medication? Yes  Has the patient been seen for an appointment in the last year OR does the patient have an upcoming appointment? Yes  Can we respond through MyChart? Yes  Agent: Please be advised that Rx refills may take up to 3 business days. We ask that you follow-up with your pharmacy.

## 2024-05-23 DIAGNOSIS — L570 Actinic keratosis: Secondary | ICD-10-CM | POA: Diagnosis not present

## 2024-05-23 DIAGNOSIS — L821 Other seborrheic keratosis: Secondary | ICD-10-CM | POA: Diagnosis not present

## 2024-05-24 NOTE — Progress Notes (Signed)
 Subjective:  Patient ID: Andre Shaffer, male    DOB: June 29, 1941  Age: 83 y.o. MRN: [id number]  Chief Complaint  Patient presents with   Medical Management of Chronic Issues    HPI:  Diabetes:  Complications: neuropathy.  Glucose checking: Checks sugars fasting daily Glucose logs: 96-150, occasionally > 200.  Hypoglycemia: none  Most recent A1C: 7.4% Current medications:  Novolog  insulin  injection 70/30 60 U in am and 45 U before supper, Gabapentin  300 mg 1 capsule twice daily, farxiga  10 mg daily Last Eye Exam: Eye exam in Jan or Feb 2025 Dr. Lera Foot checks: daily.    Hyperlipidemia: Current medications: Atorvastatin  20 mg daily. Zetia  10 mg    Hypertension/CAD: Current medications: none. Diet: healthy. On Plavix . Sees Dr. Delford annually.    Atrial Fibrillation: on eliquis  5 mg twice daily.  Cutaneous lesions - Recent dermatology evaluation for skin lesions - Initial cryotherapy was ineffective - Subsequent treatment performed at [facility] - Does not use sunscreen but wears a large straw hat when outdoors  Exertional dyspnea - Experiences shortness of breath with exertion, particularly when climbing hills - Symptoms resolve with rest - No chest pain - Dyspnea has been stable for several years without worsening  Knee pain - Received a steroid injection in the knee, resulting in symptomatic relief at orthopedics. 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          05/25/2024    8:51 AM 02/23/2024    8:48 AM 07/25/2023    9:53 AM 06/22/2023    9:50 AM 06/09/2023   11:47 AM  Depression screen PHQ 2/9  Decreased Interest 0 0 0 0 0  Down, Depressed, Hopeless 0 0 0 0 0  PHQ - 2 Score 0 0 0 0 0  Altered sleeping 1  0 0   Tired, decreased energy 3  0  0   Change in appetite 0  0 0   Feeling bad or failure about yourself  0  0 0   Trouble concentrating 0  0 0   Moving slowly or fidgety/restless 0  0 0   Suicidal thoughts 0  0 0   PHQ-9 Score 4  0 0   Difficult doing work/chores Somewhat difficult  Not difficult at all Not difficult at all         05/25/2024    8:51 AM  Fall Risk   Falls in the past year? 0  Number falls in past yr: 0  Injury with Fall? 0  Risk for fall due to : No Fall Risks  Follow up Falls evaluation completed    Patient Care Team: Sherre Clapper, MD as PCP - General (Family Medicine) Delford Maude BROCKS, MD as PCP - Cardiology (Cardiology) Corlis Sharlet PARAS, RN as Case Manager (General Practice)   Review of Systems  Constitutional:  Negative for chills, diaphoresis, fatigue and fever.  HENT:  Negative for congestion, ear pain and sore throat.   Respiratory:  Negative for cough and shortness of breath.   Cardiovascular:  Negative for chest pain and leg swelling.  Gastrointestinal:  Negative for abdominal pain, constipation, diarrhea, nausea and vomiting.  Genitourinary:  Negative for dysuria and urgency.  Musculoskeletal:  Negative for arthralgias and myalgias.  Neurological:  Negative for dizziness and  headaches.  Psychiatric/Behavioral:  Negative for dysphoric mood.     Current Outpatient Medications on File Prior to Visit  Medication Sig Dispense Refill   acetaminophen  (TYLENOL ) 500 MG tablet Take 500-1,000 mg by mouth every 6 (six) hours as needed (pain.).     apixaban  (ELIQUIS ) 5 MG TABS tablet TAKE 1 TABLET BY MOUTH TWICE DAILY. 60 tablet 5   atorvastatin  (LIPITOR) 20 MG tablet Take 1 tablet (20 mg total) by mouth daily. 90 tablet 1   Blood Glucose Monitoring Suppl (ONETOUCH VERIO) w/Device KIT Use as instructed. Check blood glucose twice daily. E11.42 1 kit 0   clopidogrel  (PLAVIX ) 75 MG tablet TAKE 1 TABLET BY MOUTH EVERY DAY WITH BREAKFAST 30 tablet 6   Cyanocobalamin 1000 MCG/ML LIQD Place 2,500 mcg  under the tongue in the morning. Place 0.5 dropperful (2,500 mcg) sublingually daily     ezetimibe  (ZETIA ) 10 MG tablet Take 1 tablet (10 mg total) by mouth daily. 90 tablet 0   FARXIGA  10 MG TABS tablet TAKE 1 TABLET BY MOUTH DAILY. 90 tablet 0   gabapentin  (NEURONTIN ) 300 MG capsule Take 1 capsule (300 mg total) by mouth 2 (two) times daily. 180 capsule 2   GLOBAL EASE INJECT PEN NEEDLES 31G X 8 MM MISC USE TO INJECT INTO THE SKIN TWICE A DAY 100 each 1   glucose blood test strip      insulin  aspart protamine- aspart (NOVOLOG  MIX 70/30) (70-30) 100 UNIT/ML injection Inject 0.4-0.6 mLs (40-60 Units total) into the skin See admin instructions. Inject 60 units into the skin before breakfast and 40 units units before supper/evening meal 10 mL 0   Insulin  Pen Needle (NOVOFINE) 30G X 8 MM MISC Inject 10 each into the skin as needed. E11.42 100 each 2   Lancets (ONETOUCH ULTRASOFT) lancets Use as instructed, Check blood glucose twice daily. 100 each 12   Multiple Vitamins-Minerals (PRESERVISION AREDS PO) Take 1 capsule by mouth 2 (two) times daily.     ONETOUCH VERIO test strip CHECK BLOOD SUGAR TWICE DAILY. 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 // NSTEMI 08/2022 >> s/p DES to S-OM [L-LAD patent, S-Dx patent, S-RCA CTO] // Echo 2015: EF 45-50, inf HK // Echo 10/23: EF 55-60, no WMA   Diabetes mellitus without complication (HCC)    GERD (gastroesophageal reflux disease)    Hyperlipemia    Hypertension    MI (myocardial infarction) (HCC)    SCC (squamous cell carcinoma), arm, left    SCC (squamous cell carcinoma), arm,  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   Past Surgical History:  Procedure Laterality Date   ANKLE SURGERY  93,03   rt and lt    CARDIOVERSION N/A 10/20/2022   Procedure: CARDIOVERSION;  Surgeon: Delford Maude BROCKS, MD;  Location: [facility];  Service: Cardiovascular;  Laterality: N/A;   CHOLECYSTECTOMY  10/19/2012   Procedure: LAPAROSCOPIC CHOLECYSTECTOMY WITH INTRAOPERATIVE CHOLANGIOGRAM;  Surgeon: Lynda Leos, MD;  Location: [facility];  Service: General;  Laterality: N/A;   COLONOSCOPY     CORONARY ARTERY BYPASS GRAFT  1997   CORONARY STENT INTERVENTION N/A 08/12/2022   Procedure: CORONARY STENT INTERVENTION;  Surgeon: Verlin Lonni BIRCH, MD;  Location: [facility];  Service: Cardiovascular;  Laterality: N/A;   ENDOSCOPIC RETROGRADE CHOLANGIOPANCREATOGRAPHY (ERCP) WITH PROPOFOL  N/A 07/16/2021   Procedure: ENDOSCOPIC RETROGRADE CHOLANGIOPANCREATOGRAPHY (ERCP) WITH PROPOFOL ;  Surgeon: Teressa Toribio SQUIBB, MD;  Location: [facility];  Service: Endoscopy;  Laterality: N/A;   LEFT HEART CATH AND CORS/GRAFTS ANGIOGRAPHY N/A 08/12/2022   Procedure: LEFT HEART CATH AND CORS/GRAFTS ANGIOGRAPHY;  Surgeon: Verlin Lonni BIRCH, MD;  Location: [facility];  Service: Cardiovascular;  Laterality: N/A;   REMOVAL OF STONES  07/16/2021   Procedure: REMOVAL OF STONES;  Surgeon: Teressa Toribio SQUIBB, MD;  Location: [facility];  Service: Endoscopy;;   SKIN CANCER EXCISION  12/2020   Squamous Cell Cancer   SPHINCTEROTOMY  07/16/2021   Procedure: SPHINCTEROTOMY;  Surgeon: Teressa Toribio SQUIBB, MD;  Location: [facility];  Service: Endoscopy;;   TIBIA FRACTURE SURGERY      History reviewed. No pertinent family history. Social History   Socioeconomic History   Marital status: Married    Spouse name: Not on file   Number of children: Not on file   Years of education: Not on file   Highest education level: Not on file  Occupational History   Not on file  Tobacco Use   Smoking status: Former     Current packs/day: 0.00    Average packs/day: 1 pack/day for 20.0 years (20.0 ttl pk-yrs)    Types: Cigarettes    Start date: 10/19/1955    Quit date: 10/19/1975    Years since quitting: 48.6   Smokeless tobacco: Never   Tobacco comments:    Former smoker 11/11/22  Substance and Sexual Activity   Alcohol use: No   Drug use: No   Sexual activity: Not on file  Other Topics Concern   Not on file  Social History Narrative   Not on file   Social Drivers of Health   Financial Resource Strain: Low Risk  (02/23/2024)   Overall Financial Resource Strain (CARDIA)    Difficulty of Paying Living Expenses: Not hard at all  Food Insecurity: No Food Insecurity (05/25/2024)   Hunger Vital Sign    Worried About Running Out of Food in the Last Year: Never true    Ran Out of Food in the Last Year: Never true  Transportation Needs: No Transportation Needs (05/25/2024)   PRAPARE - Administrator, Civil Service (Medical): No    Lack of Transportation (Non-Medical): No  Physical Activity: Insufficiently Active (02/23/2024)   Exercise Vital Sign    Days of Exercise per Week: 3 days    Minutes of Exercise per Session: 20 min  Stress: No Stress Concern Present (05/25/2024)   [employer] of Occupational Health - Occupational Stress Questionnaire    Feeling of Stress: Not at all  Social Connections: Socially Integrated (05/25/2024)   Social Connection and Isolation Panel    Frequency of Communication with Friends and Family: More than three times a week    Frequency of Social Gatherings with Friends and Family: More than three times a week    Attends Religious Services: More than 4 times per year    Active Member of [employer] or Organizations: Yes    Attends Engineer, structural: More than 4 times per year    Marital Status: Married    Objective:  BP 124/72   Pulse (!) 49  Temp 98 F (36.7 C)   Ht 6' 1 (1.854 m)   Wt 241 lb (109.3 kg)   SpO2 98%   BMI 31.80 kg/m       05/25/2024    8:46 AM 02/23/2024    8:24 AM 02/06/2024    9:38 AM  BP/Weight  Systolic BP 124 124 130  Diastolic BP 72 78 82  Wt. (Lbs) 241 248 249  BMI 31.8 kg/m2 32.72 kg/m2 32.85 kg/m2    Physical Exam Vitals reviewed.  Constitutional:      Appearance: Normal appearance. He is normal weight.  Cardiovascular:     Rate and Rhythm: Normal rate and regular rhythm.     Heart sounds: No murmur heard. Pulmonary:     Effort: Pulmonary effort is normal.     Breath sounds: Normal breath sounds.  Abdominal:     General: Abdomen is flat. Bowel sounds are normal.     Palpations: Abdomen is soft.     Tenderness: There is no abdominal tenderness.  Neurological:     Mental Status: He is alert and oriented to person, place, and time.  Psychiatric:        Mood and Affect: Mood normal.        Behavior: Behavior normal.    Diabetic Foot Exam - Simple   Simple Foot Form Diabetic Foot exam was performed with the following findings: Yes 05/25/2024 10:41 PM  Visual Inspection See comments: Yes Sensation Testing See comments: Yes Pulse Check Posterior Tibialis and Dorsalis pulse intact bilaterally: Yes Comments Foot Exam: done at [place]. Recent foot exam done by podiatry at the [facility]. DYSTOPHIC TOENAILS. NO OPEN AREAS, SWELLING, OR REDNESS NOTED. NO C/O'S PAIN OR DISCOMFORT TO BIL FEET.       Lab Results  Component Value Date   WBC 7.3 05/25/2024   HGB 16.6 05/25/2024   HCT 50.5 05/25/2024   PLT 137 (L) 05/25/2024   GLUCOSE 292 (H) 05/25/2024   CHOL 116 05/25/2024   TRIG 223 (H) 05/25/2024   HDL 30 (L) 05/25/2024   LDLCALC 50 05/25/2024   ALT 14 05/25/2024   AST 16 05/25/2024   NA 142 05/25/2024   K 4.7 05/25/2024   CL 109 (H) 05/25/2024   CREATININE 1.14 05/25/2024   BUN 21 05/25/2024   CO2 16 (L) 05/25/2024   TSH 4.100 05/18/2022   HGBA1C 7.7 (H) 05/25/2024   MICROALBUR 80 09/10/2021      Assessment & Plan:  Encounter for Medicare annual wellness exam Assessment &  Plan: Things to do to keep yourself healthy  - Exercise at least 30-45 minutes a day, 3-4 days a week.  - Eat a low-fat diet with lots of fruits and vegetables, up to 7-9 servings per day.  - Seatbelts can save your life. Wear them always.  - Smoke detectors on every level of your home, check batteries every year.  - Eye Doctor - have an eye exam every 1-2 years  - Safe sex - if you may be exposed to STDs, use a condom.  - Alcohol -  If you drink, do it moderately, less than 2 drinks per day.  - Health Care Power of Attorney. Choose someone to speak for you if you are not able.  - Depression is common in our stressful world.If you're feeling down or losing interest in things you normally enjoy, please come in for a visit.  - Violence - If anyone is threatening or hurting you, please call immediately.  Mixed hyperlipidemia Assessment & Plan: Well controlled.  No changes to medicines. Atorvastatin  20 mg daily, zetia  10 mg daily Continue to work on eating a healthy diet and exercise.  Labs drawn today  Orders: -     Lipid panel  Type 2 diabetes mellitus with hyperglycemia, with long-term current use of insulin  (HCC) Assessment & Plan: Diabetes improved. Continue: Farxiga  10mg  daily and NovoLog  70/30 (60 units in the morning and 45 units before supper). Patient also takes Gabapentin  300mg  twice daily for neuropathy. Recommend start on ozempic.  -Continue current medication regimen. -Check HbA1c today.  Orders: -     Hemoglobin A1c  Hypertensive heart disease without congestive heart failure Assessment & Plan: Stable. No antihypertensives.  Continue farxiga  10 mg daily.  Continue plavix .  Orders: -     Comprehensive metabolic panel with GFR -     CBC with Differential/Platelet  Paroxysmal atrial fibrillation (HCC) Assessment & Plan: On eliquis  5 mg twice daily.  Management per specialist.     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   Orders Placed This Encounter  Procedures   Lipid panel   Hemoglobin A1c   Comprehensive metabolic panel with GFR   CBC with Differential/Platelet   [facility] DIABETES EYE EXAM     Follow-up: Return in 1 year (on 05/25/2025).   I,Katherina A Bramblett,acting as a scribe for Abigail Free, MD.,have documented all relevant documentation on the behalf of Abigail Free, MD,as directed by  Abigail Free, MD while in the presence of Abigail Free, MD.   An After Visit Summary was printed and given to the patient.  I attest that I have reviewed this visit and agree with the plan scribed by my staff.  Abigail Free, MD [facility] [phone]

## 2024-05-25 ENCOUNTER — Ambulatory Visit: Admitting: Family Medicine

## 2024-05-25 ENCOUNTER — Encounter: Payer: Self-pay | Admitting: Family Medicine

## 2024-05-25 VITALS — BP 124/72 | HR 49 | Temp 98.0°F | Ht 73.0 in | Wt 241.0 lb

## 2024-05-25 DIAGNOSIS — E113212 Type 2 diabetes mellitus with mild nonproliferative diabetic retinopathy with macular edema, left eye: Secondary | ICD-10-CM

## 2024-05-25 DIAGNOSIS — I480 Paroxysmal atrial fibrillation: Secondary | ICD-10-CM

## 2024-05-25 DIAGNOSIS — Z794 Long term (current) use of insulin: Secondary | ICD-10-CM | POA: Diagnosis not present

## 2024-05-25 DIAGNOSIS — Z0000 Encounter for general adult medical examination without abnormal findings: Secondary | ICD-10-CM | POA: Diagnosis not present

## 2024-05-25 DIAGNOSIS — I119 Hypertensive heart disease without heart failure: Secondary | ICD-10-CM

## 2024-05-25 DIAGNOSIS — E782 Mixed hyperlipidemia: Secondary | ICD-10-CM | POA: Diagnosis not present

## 2024-05-25 DIAGNOSIS — E1165 Type 2 diabetes mellitus with hyperglycemia: Secondary | ICD-10-CM

## 2024-05-25 NOTE — Progress Notes (Signed)
 Subjective:   Andre Shaffer is a 83 y.o. male who presents for Medicare Annual/Subsequent preventive examination.  Visit Complete: In person  Patient Medicare AWV questionnaire was completed by the patient; I have confirmed that all information answered by patient is correct and no changes since this date.  Cardiac Risk Factors include: advanced age (>42men, >46 women);diabetes mellitus;hypertension     Objective:    Today's Vitals   05/25/24 0846  BP: 124/72  Pulse: (!) 49  Temp: 98 F (36.7 C)  SpO2: 98%  Weight: 241 lb (109.3 kg)  Height: 6' 1 (1.854 m)  PainSc: 0-No pain   Body mass index is 31.8 kg/m.     05/25/2024    9:17 AM 02/23/2024    9:00 AM 10/20/2022    8:31 AM 07/16/2021    7:46 AM 05/01/2021   10:44 AM 10/18/2012    9:26 AM 10/14/2012    3:46 AM  Advanced Directives  Does Patient Have a Medical Advance Directive? Yes Yes No Yes Yes Patient does not have advance directive;Patient would not like information  Patient does not have advance directive;Patient would not like information   Type of Public librarian Power of [place];Living will Healthcare Power of [employer] of [place];Living will Healthcare Power of [place];Living will    Does patient want to make changes to medical advance directive? No - Guardian declined    No - Patient declined    Copy of Healthcare Power of Attorney in Chart? No - copy requested   No - copy requested     Would patient like information on creating a medical advance directive?   No - Patient declined      Pre-existing out of facility DNR order (yellow form or pink MOST form)       No      Data saved with a previous flowsheet row definition    Current Medications (verified) Outpatient Encounter Medications as of 05/25/2024  Medication Sig   acetaminophen  (TYLENOL ) 500 MG tablet Take 500-1,000 mg by mouth every 6 (six) hours as needed (pain.).   apixaban  (ELIQUIS ) 5 MG TABS tablet TAKE 1 TABLET  BY MOUTH TWICE DAILY.   atorvastatin  (LIPITOR) 20 MG tablet Take 1 tablet (20 mg total) by mouth daily.   Blood Glucose Monitoring Suppl (ONETOUCH VERIO) w/Device KIT Use as instructed. Check blood glucose twice daily. E11.42   clopidogrel  (PLAVIX ) 75 MG tablet TAKE 1 TABLET BY MOUTH EVERY DAY WITH BREAKFAST   Cyanocobalamin 1000 MCG/ML LIQD Place 2,500 mcg under the tongue in the morning. Place 0.5 dropperful (2,500 mcg) sublingually daily   ezetimibe  (ZETIA ) 10 MG tablet Take 1 tablet (10 mg total) by mouth daily.   FARXIGA  10 MG TABS tablet TAKE 1 TABLET BY MOUTH DAILY.   gabapentin  (NEURONTIN ) 300 MG capsule Take 1 capsule (300 mg total) by mouth 2 (two) times daily.   GLOBAL EASE INJECT PEN NEEDLES 31G X 8 MM MISC USE TO INJECT INTO THE SKIN TWICE A DAY   glucose blood test strip    insulin  aspart protamine- aspart (NOVOLOG  MIX 70/30) (70-30) 100 UNIT/ML injection Inject 0.4-0.6 mLs (40-60 Units total) into the skin See admin instructions. Inject 60 units into the skin before breakfast and 40 units units before supper/evening meal   Insulin  Pen Needle (NOVOFINE) 30G X 8 MM MISC Inject 10 each into the skin as needed. E11.42   Lancets (ONETOUCH ULTRASOFT) lancets Use as instructed, Check blood glucose twice daily.  Multiple Vitamins-Minerals (PRESERVISION AREDS PO) Take 1 capsule by mouth 2 (two) times daily.   ONETOUCH VERIO test strip CHECK BLOOD SUGAR TWICE DAILY.   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 // NSTEMI 08/2022 >> s/p DES to S-OM [L-LAD patent, S-Dx patent, S-RCA CTO] // Echo 2015: EF 45-50, inf HK // Echo 10/23: EF  55-60, no WMA   Diabetes mellitus without complication (HCC)    GERD (gastroesophageal reflux disease)    Hyperlipemia    Hypertension    MI (myocardial infarction) (HCC)    SCC (squamous cell carcinoma), arm, left    SCC (squamous cell carcinoma), arm, 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   Past Surgical History:  Procedure Laterality Date   ANKLE SURGERY  93,03   rt and lt    CARDIOVERSION N/A 10/20/2022   Procedure: CARDIOVERSION;  Surgeon: Delford Maude BROCKS, MD;  Location: [facility];  Service: Cardiovascular;  Laterality: N/A;   CHOLECYSTECTOMY  10/19/2012   Procedure: LAPAROSCOPIC CHOLECYSTECTOMY WITH INTRAOPERATIVE CHOLANGIOGRAM;  Surgeon: Lynda Leos, MD;  Location:  SURGERY CENTER;  Service: General;  Laterality: N/A;   COLONOSCOPY     CORONARY ARTERY BYPASS GRAFT  1997   CORONARY STENT INTERVENTION N/A 08/12/2022   Procedure: CORONARY STENT INTERVENTION;  Surgeon: Verlin Lonni BIRCH, MD;  Location: [facility];  Service: Cardiovascular;  Laterality: N/A;   ENDOSCOPIC RETROGRADE CHOLANGIOPANCREATOGRAPHY (ERCP) WITH PROPOFOL  N/A 07/16/2021   Procedure: ENDOSCOPIC RETROGRADE CHOLANGIOPANCREATOGRAPHY (ERCP) WITH PROPOFOL ;  Surgeon: Teressa Toribio SQUIBB, MD;  Location: [facility];  Service: Endoscopy;  Laterality: N/A;   LEFT HEART CATH AND CORS/GRAFTS ANGIOGRAPHY N/A 08/12/2022   Procedure: LEFT HEART CATH AND CORS/GRAFTS ANGIOGRAPHY;  Surgeon: Verlin Lonni BIRCH, MD;  Location: [facility];  Service: Cardiovascular;  Laterality: N/A;   REMOVAL OF STONES  07/16/2021   Procedure: REMOVAL OF STONES;  Surgeon: Teressa Toribio SQUIBB, MD;  Location: [facility];  Service: Endoscopy;;   SKIN CANCER EXCISION  12/2020   Squamous Cell Cancer   SPHINCTEROTOMY  07/16/2021   Procedure: SPHINCTEROTOMY;  Surgeon: Teressa Toribio SQUIBB, MD;  Location: [facility];  Service: Endoscopy;;   TIBIA FRACTURE SURGERY     History reviewed. No pertinent family history. Social History    Socioeconomic History   Marital status: Married    Spouse name: Not on file   Number of children: Not on file   Years of education: Not on file   Highest education level: Not on file  Occupational History   Not on file  Tobacco Use   Smoking status: Former    Current packs/day: 0.00    Average packs/day: 1 pack/day for 20.0 years (20.0 ttl pk-yrs)    Types: Cigarettes    Start date: 10/19/1955    Quit date: 10/19/1975    Years since quitting: 48.6   Smokeless tobacco: Never   Tobacco comments:    Former smoker 11/11/22  Substance and Sexual Activity   Alcohol use: No   Drug use: No   Sexual activity: Not on file  Other Topics Concern   Not on file  Social History Narrative   Not on file  Social Drivers of Corporate investment banker Strain: Low Risk  (02/23/2024)   Overall Financial Resource Strain (CARDIA)    Difficulty of Paying Living Expenses: Not hard at all  Food Insecurity: No Food Insecurity (05/25/2024)   Hunger Vital Sign    Worried About Running Out of Food in the Last Year: Never true    Ran Out of Food in the Last Year: Never true  Transportation Needs: No Transportation Needs (05/25/2024)   PRAPARE - Administrator, Civil Service (Medical): No    Lack of Transportation (Non-Medical): No  Physical Activity: Insufficiently Active (02/23/2024)   Exercise Vital Sign    Days of Exercise per Week: 3 days    Minutes of Exercise per Session: 20 min  Stress: No Stress Concern Present (05/25/2024)   [employer] of Occupational Health - Occupational Stress Questionnaire    Feeling of Stress: Not at all  Social Connections: Socially Integrated (05/25/2024)   Social Connection and Isolation Panel    Frequency of Communication with Friends and Family: More than three times a week    Frequency of Social Gatherings with Friends and Family: More than three times a week    Attends Religious Services: More than 4 times per year    Active Member of [employer]  or Organizations: Yes    Attends Engineer, structural: More than 4 times per year    Marital Status: Married    Tobacco Counseling Counseling given: Not Answered Tobacco comments: Former smoker 11/11/22   Clinical Intake:  Pre-visit preparation completed: No  Pain : No/denies pain Pain Score: 0-No pain     Nutritional Status: BMI > 30  Obese Nutritional Risks: None Diabetes: Yes CBG done?: No Did pt. bring in CBG monitor from home?: No  How often do you need to have someone help you when you read instructions, pamphlets, or other written materials from your doctor or pharmacy?: 1 - Never  Interpreter Needed?: No      Activities of Daily Living    05/25/2024    9:19 AM 02/23/2024    8:57 AM  In your present state of health, do you have any difficulty performing the following activities:  Hearing? 0 1  Vision? 0 0  Difficulty concentrating or making decisions? 0 0  Walking or climbing stairs? 0 1  Dressing or bathing? 0 0  Doing errands, shopping? 0 0  Preparing Food and eating ? N N  Using the Toilet? N N  In the past six months, have you accidently leaked urine? N N  Do you have problems with loss of bowel control? N N  Managing your Medications? N N  Managing your Finances? N N  Housekeeping or managing your Housekeeping? N N    Patient Care Team: CoxAbigail, MD as PCP - General (Family Medicine) Delford Maude BROCKS, MD as PCP - Cardiology (Cardiology) Corlis Sharlet PARAS, RN as Case Manager (General Practice)  Indicate any recent Medical Services you may have received from other than [facility] providers in the past year (date may be approximate).     Assessment:   This is a routine wellness examination for Andre Shaffer.  Hearing/Vision screen No results found.   Goals Addressed   None    Depression Screen    05/25/2024    8:51 AM 02/23/2024    8:48 AM 07/25/2023    9:53 AM 06/22/2023    9:50 AM 06/09/2023   11:47 AM 12/02/2022   10:49 AM 09/10/2022  11:36  AM  PHQ 2/9 Scores  PHQ - 2 Score 0 0 0 0 0 0 0  PHQ- 9 Score 4  0 0       Fall Risk    05/25/2024    8:51 AM 02/23/2024    9:00 AM 07/25/2023    9:53 AM 06/22/2023    9:49 AM 06/09/2023   11:47 AM  Fall Risk   Falls in the past year? 0 0 0 0 0  Number falls in past yr: 0 0 0 0 0  Injury with Fall? 0 0 0 0 0  Risk for fall due to : No Fall Risks History of fall(s) No Fall Risks;Impaired mobility History of fall(s) No Fall Risks  Follow up Falls evaluation completed Falls prevention discussed;Falls evaluation completed  Falls evaluation completed;Follow up appointment Falls evaluation completed;Follow up appointment    MEDICARE RISK AT HOME: Medicare Risk at Home Any stairs in or around the home?: Yes If so, are there any without handrails?: Yes Home free of loose throw rugs in walkways, pet beds, electrical cords, etc?: Yes Adequate lighting in your home to reduce risk of falls?: Yes Life alert?: No Use of a cane, walker or w/c?: Yes Grab bars in the bathroom?: No Shower chair or bench in shower?: Yes Elevated toilet seat or a handicapped toilet?: No  TIMED UP AND GO:  Was the test performed?  Yes  Length of time to ambulate 10 feet: 3 sec Gait steady and fast without use of assistive device    Cognitive Function:        05/25/2024    9:20 AM 02/23/2024    9:02 AM  6CIT Screen  What Year? 0 points 0 points  What month? 0 points 0 points  What time? 0 points 0 points  Count back from 20 0 points 0 points  Months in reverse 0 points 0 points  Repeat phrase 2 points 2 points  Total Score 2 points 2 points    Immunizations Immunization History  Administered Date(s) Administered   Fluad Quad(high Dose 65+) 10/06/2020, 08/10/2021, 08/25/2022   Fluad Trivalent(High Dose 65+) 10/03/2023   Influenza, High Dose Seasonal PF 08/02/2015, 07/06/2016, 08/01/2017, 08/01/2018   Influenza-Unspecified 08/02/2019, 10/06/2020, 08/01/2021   PFIZER(Purple Top)SARS-COV-2 Vaccination  01/17/2020, 02/07/2020, 02/27/2020, 10/16/2020   Pneumococcal Polysaccharide-23 08/01/2014    Screening Tests Health Maintenance  Topic Date Due   FOOT EXAM  05/19/2023   COVID-19 Vaccine (5 - 2024-25 season) 08/10/2024 (Originally 07/03/2023)   Pneumococcal Vaccine: 50+ Years (2 of 2 - PCV) 10/02/2024 (Originally 08/02/2015)   Zoster Vaccines- Shingrix (1 of 2) 05/20/2025 (Originally 07/17/1960)   INFLUENZA VACCINE  06/01/2024   HEMOGLOBIN A1C  08/24/2024   Diabetic kidney evaluation - eGFR measurement  02/22/2025   Diabetic kidney evaluation - Urine ACR  02/22/2025   OPHTHALMOLOGY EXAM  05/02/2025   Medicare Annual Wellness (AWV)  05/25/2025   Colonoscopy  01/16/2026   Hepatitis B Vaccines  Aged Out   HPV VACCINES  Aged Out   Meningococcal B Vaccine  Aged Out   DTaP/Tdap/Td  Discontinued    Health Maintenance  Health Maintenance Due  Topic Date Due   FOOT EXAM  05/19/2023    Additional Screening:    Vision Screening: Recommended annual ophthalmology exams for early detection of glaucoma and other disorders of the eye. Is the patient up to date with their annual eye exam?  Yes  Who is the provider or what is the name of the  office in which the patient attends annual eye exams? Dr. Lera If pt is not established with a provider, would they like to be referred to a provider to establish care? No .   Dental Screening: Recommended annual dental exams for proper oral hygiene  Diabetic Foot Exam: Diabetic Foot Exam: Completed today  Community Resource Referral / Chronic Care Management: CRR required this visit?  No   CCM required this visit?  No     Plan:     I have personally reviewed and noted the following in the patient's chart:   Medical and social history Use of alcohol, tobacco or illicit drugs  Current medications and supplements including opioid prescriptions. Patient is not currently taking opioid prescriptions. Functional ability and status Nutritional  status Physical activity Advanced directives List of other physicians Hospitalizations, surgeries, and ER visits in previous 12 months Vitals Screenings to include cognitive, depression, and falls Referrals and appointments  In addition, I have reviewed and discussed with patient certain preventive protocols, quality metrics, and best practice recommendations. A written personalized care plan for preventive services as well as general preventive health recommendations were provided to patient.    Andre Shaffer,acting as a scribe for Abigail Free, MD.,have documented all relevant documentation on the behalf of Abigail Free, MD,as directed by  Abigail Free, MD while in the presence of Abigail Free, MD.   I attest that I have reviewed this visit and agree with the plan scribed by my staff.   Abigail Free, MD [facility] [phone]

## 2024-05-27 ENCOUNTER — Ambulatory Visit: Payer: Self-pay | Admitting: Family Medicine

## 2024-05-27 DIAGNOSIS — Z0000 Encounter for general adult medical examination without abnormal findings: Secondary | ICD-10-CM | POA: Insufficient documentation

## 2024-05-27 DIAGNOSIS — E113212 Type 2 diabetes mellitus with mild nonproliferative diabetic retinopathy with macular edema, left eye: Secondary | ICD-10-CM | POA: Insufficient documentation

## 2024-05-27 LAB — CBC WITH DIFFERENTIAL/PLATELET
Basophils Absolute: 0.1 x10E3/uL (ref 0.0–0.2)
Basos: 1 %
EOS (ABSOLUTE): 0.2 x10E3/uL (ref 0.0–0.4)
Eos: 3 %
Hematocrit: 50.5 % (ref 37.5–51.0)
Hemoglobin: 16.6 g/dL (ref 13.0–17.7)
Immature Grans (Abs): 0 x10E3/uL (ref 0.0–0.1)
Immature Granulocytes: 0 %
Lymphocytes Absolute: 1 x10E3/uL (ref 0.7–3.1)
Lymphs: 14 %
MCH: 33.4 pg — ABNORMAL HIGH (ref 26.6–33.0)
MCHC: 32.9 g/dL (ref 31.5–35.7)
MCV: 102 fL — ABNORMAL HIGH (ref 79–97)
Monocytes Absolute: 0.3 x10E3/uL (ref 0.1–0.9)
Monocytes: 4 %
Neutrophils Absolute: 5.7 x10E3/uL (ref 1.4–7.0)
Neutrophils: 78 %
Platelets: 137 x10E3/uL — ABNORMAL LOW (ref 150–450)
RBC: 4.97 x10E6/uL (ref 4.14–5.80)
RDW: 13.1 % (ref 11.6–15.4)
WBC: 7.3 x10E3/uL (ref 3.4–10.8)

## 2024-05-27 LAB — COMPREHENSIVE METABOLIC PANEL WITH GFR
ALT: 14 IU/L (ref 0–44)
AST: 16 IU/L (ref 0–40)
Albumin: 4.1 g/dL (ref 3.7–4.7)
Alkaline Phosphatase: 132 IU/L — ABNORMAL HIGH (ref 44–121)
BUN/Creatinine Ratio: 18 (ref 10–24)
BUN: 21 mg/dL (ref 8–27)
Bilirubin Total: 0.8 mg/dL (ref 0.0–1.2)
CO2: 16 mmol/L — ABNORMAL LOW (ref 20–29)
Calcium: 9.5 mg/dL (ref 8.6–10.2)
Chloride: 109 mmol/L — ABNORMAL HIGH (ref 96–106)
Creatinine, Ser: 1.14 mg/dL (ref 0.76–1.27)
Globulin, Total: 2.4 g/dL (ref 1.5–4.5)
Glucose: 292 mg/dL — ABNORMAL HIGH (ref 70–99)
Potassium: 4.7 mmol/L (ref 3.5–5.2)
Sodium: 142 mmol/L (ref 134–144)
Total Protein: 6.5 g/dL (ref 6.0–8.5)
eGFR: 63 mL/min/1.73 (ref >59)

## 2024-05-27 LAB — HEMOGLOBIN A1C
Est. average glucose Bld gHb Est-mCnc: 174 mg/dL
Hgb A1c MFr Bld: 7.7 % — ABNORMAL HIGH (ref 4.8–5.6)

## 2024-05-27 LAB — LIPID PANEL
Chol/HDL Ratio: 3.9 ratio (ref 0.0–5.0)
Cholesterol, Total: 116 mg/dL (ref 100–199)
HDL: 30 mg/dL — ABNORMAL LOW (ref >39)
LDL Chol Calc (NIH): 50 mg/dL (ref 0–99)
Triglycerides: 223 mg/dL — ABNORMAL HIGH (ref 0–149)
VLDL Cholesterol Cal: 36 mg/dL (ref 5–40)

## 2024-05-27 NOTE — Assessment & Plan Note (Signed)

## 2024-05-27 NOTE — Assessment & Plan Note (Signed)
 Stable. No antihypertensives.  Continue farxiga 10 mg daily.  Continue plavix.

## 2024-05-27 NOTE — Assessment & Plan Note (Signed)
 Well controlled.  No changes to medicines. Atorvastatin  20 mg daily, zetia  10 mg daily Continue to work on eating a healthy diet and exercise.  Labs drawn today

## 2024-05-27 NOTE — Assessment & Plan Note (Signed)
 Diabetes improved. Continue: Farxiga  10mg  daily and NovoLog  70/30 (60 units in the morning and 45 units before supper). Patient also takes Gabapentin  300mg  twice daily for neuropathy. Recommend start on ozempic.  -Continue current medication regimen. -Check HbA1c today.

## 2024-05-31 NOTE — Assessment & Plan Note (Signed)
 On eliquis  5 mg twice daily.  Management per specialist.

## 2024-08-13 ENCOUNTER — Other Ambulatory Visit: Payer: Self-pay | Admitting: Family Medicine

## 2024-08-13 DIAGNOSIS — E1165 Type 2 diabetes mellitus with hyperglycemia: Secondary | ICD-10-CM

## 2024-08-20 ENCOUNTER — Other Ambulatory Visit: Payer: Self-pay | Admitting: Family Medicine

## 2024-09-04 ENCOUNTER — Ambulatory Visit (INDEPENDENT_AMBULATORY_CARE_PROVIDER_SITE_OTHER): Admitting: Family Medicine

## 2024-09-04 ENCOUNTER — Encounter: Payer: Self-pay | Admitting: Family Medicine

## 2024-09-04 VITALS — BP 124/62 | HR 89 | Temp 97.8°F | Ht 73.0 in | Wt 241.0 lb

## 2024-09-04 DIAGNOSIS — I119 Hypertensive heart disease without heart failure: Secondary | ICD-10-CM | POA: Diagnosis not present

## 2024-09-04 DIAGNOSIS — M1712 Unilateral primary osteoarthritis, left knee: Secondary | ICD-10-CM

## 2024-09-04 DIAGNOSIS — Z794 Long term (current) use of insulin: Secondary | ICD-10-CM

## 2024-09-04 DIAGNOSIS — E1165 Type 2 diabetes mellitus with hyperglycemia: Secondary | ICD-10-CM | POA: Diagnosis not present

## 2024-09-04 DIAGNOSIS — E782 Mixed hyperlipidemia: Secondary | ICD-10-CM

## 2024-09-04 DIAGNOSIS — I480 Paroxysmal atrial fibrillation: Secondary | ICD-10-CM

## 2024-09-04 MED ORDER — TRIAMCINOLONE ACETONIDE 40 MG/ML IJ SUSP: 80.0000 mg | Freq: Once | INTRAMUSCULAR | Status: AC

## 2024-09-04 MED ORDER — TRIAMCINOLONE ACETONIDE 0.1 % EX CREA: 1.0000 | TOPICAL_CREAM | Freq: Two times a day (BID) | CUTANEOUS | 0 refills | Status: AC

## 2024-09-04 NOTE — Progress Notes (Signed)
 Subjective:  Patient ID: Andre Shaffer, male    DOB: 26-Jul-1941  Age: 83 y.o. MRN: [id number]  Chief Complaint  Patient presents with   Knee Pain    HPI:  Diabetes:  Complications: neuropathy.  Glucose checking: Checks sugars fasting daily Glucose logs: 96-150s, occasionally > 200.  Hypoglycemia: none  Most recent A1C: 7.7% Current medications:  Novolog  insulin  injection 70/30 60 U in am and 45 U before supper, Gabapentin  300 mg 1 capsule twice daily, farxiga  10 mg daily Last Eye Exam: UTD Foot checks: daily.    Hyperlipidemia: Current medications: Atorvastatin  20 mg daily. Zetia  10 mg    Hypertension/CAD: Current medications: none. Diet: healthy. On Plavix . Sees Dr. Nishan annually.    Atrial Fibrillation: on eliquis  5 mg twice daily.  Left knee osteoarthritis - requesting knee injection     05/25/2024    8:51 AM 02/23/2024    8:48 AM 07/25/2023    9:53 AM 06/22/2023    9:50 AM 06/09/2023   11:47 AM  Depression screen PHQ 2/9  Decreased Interest 0 0 0 0 0  Down, Depressed, Hopeless 0 0 0 0 0  PHQ - 2 Score 0 0 0 0 0  Altered sleeping 1  0 0   Tired, decreased energy 3  0 0   Change in appetite 0  0 0   Feeling bad or failure about yourself  0  0 0   Trouble concentrating 0  0 0   Moving slowly or fidgety/restless 0  0 0   Suicidal thoughts 0  0 0   PHQ-9 Score 4   0  0    Difficult doing work/chores Somewhat difficult  Not difficult at all Not difficult at all      Data saved with a previous flowsheet row definition        05/25/2024    8:51 AM  Fall Risk   Falls in the past year? 0  Number falls in past yr: 0  Injury with Fall? 0  Risk for fall due to : No Fall Risks  Follow up Falls evaluation completed    Patient Care Team: Sherre Clapper, MD as PCP - General (Family Medicine) Delford Maude BROCKS, MD as PCP - Cardiology (Cardiology) Corlis Sharlet PARAS, RN as Case Manager (General Practice)   Review of Systems  Constitutional:  Negative for chills,  fatigue and fever.  HENT:  Negative for congestion, ear pain and sore throat.   Respiratory:  Negative for cough and shortness of breath.   Cardiovascular:  Negative for chest pain.  Gastrointestinal:  Negative for abdominal pain, constipation, diarrhea, nausea and vomiting.  Genitourinary:  Negative for dysuria and frequency.  Musculoskeletal:  Positive for arthralgias. Negative for myalgias.  Psychiatric/Behavioral:  Negative for dysphoric mood.        No dysphoria    Current Outpatient Medications on File Prior to Visit  Medication Sig Dispense Refill   acetaminophen  (TYLENOL ) 500 MG tablet Take 500-1,000 mg by mouth every 6 (six) hours as needed (pain.).     apixaban  (ELIQUIS ) 5 MG TABS tablet TAKE 1 TABLET BY MOUTH TWICE DAILY. 60 tablet 5   atorvastatin  (LIPITOR) 20 MG tablet Take 1 tablet (20 mg total) by mouth daily. 90 tablet 1   Blood Glucose Monitoring Suppl (ONETOUCH VERIO) w/Device KIT Use as instructed. Check blood glucose twice daily. E11.42 1 kit 0   clopidogrel  (PLAVIX ) 75 MG tablet TAKE 1 TABLET BY MOUTH EVERY DAY WITH BREAKFAST 30 tablet  6   Cyanocobalamin 1000 MCG/ML LIQD Place 2,500 mcg under the tongue in the morning. Place 0.5 dropperful (2,500 mcg) sublingually daily     ezetimibe  (ZETIA ) 10 MG tablet TAKE ONE TABLET BY MOUTH ONCE DAILY 90 tablet 0   FARXIGA  10 MG TABS tablet TAKE 1 TABLET BY MOUTH DAILY. 90 tablet 0   gabapentin  (NEURONTIN ) 300 MG capsule Take 1 capsule (300 mg total) by mouth 2 (two) times daily. 180 capsule 2   GLOBAL EASE INJECT PEN NEEDLES 31G X 8 MM MISC USE TO INJECT INTO THE SKIN TWICE A DAY 100 each 1   glucose blood test strip      insulin  aspart protamine- aspart (NOVOLOG  MIX 70/30) (70-30) 100 UNIT/ML injection Inject 0.4-0.6 mLs (40-60 Units total) into the skin See admin instructions. Inject 60 units into the skin before breakfast and 40 units units before supper/evening meal 10 mL 0   Insulin  Pen Needle (NOVOFINE) 30G X 8 MM MISC Inject  10 each into the skin as needed. E11.42 100 each 2   Lancets (ONETOUCH ULTRASOFT) lancets Use as instructed, Check blood glucose twice daily. 100 each 12   Multiple Vitamins-Minerals (PRESERVISION AREDS PO) Take 1 capsule by mouth 2 (two) times daily.     ONETOUCH VERIO test strip CHECK BLOOD SUGAR TWICE DAILY. 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   Past Surgical History:  Procedure Laterality Date   ANKLE SURGERY  93,03   rt and lt    CARDIOVERSION N/A 10/20/2022   Procedure: CARDIOVERSION;  Surgeon: Delford Maude BROCKS, MD;  Location: [facility];  Service: Cardiovascular;  Laterality: N/A;   CHOLECYSTECTOMY  10/19/2012   Procedure: LAPAROSCOPIC CHOLECYSTECTOMY WITH INTRAOPERATIVE CHOLANGIOGRAM;  Surgeon: Lynda Leos, MD;  Location: [facility];  Service: General;  Laterality: N/A;   COLONOSCOPY     CORONARY ARTERY BYPASS GRAFT  1997   CORONARY STENT INTERVENTION N/A 08/12/2022   Procedure: CORONARY STENT INTERVENTION;  Surgeon: Verlin Lonni BIRCH, MD;  Location: [facility];  Service: Cardiovascular;  Laterality: N/A;   ENDOSCOPIC RETROGRADE  CHOLANGIOPANCREATOGRAPHY (ERCP) WITH PROPOFOL  N/A 07/16/2021   Procedure: ENDOSCOPIC RETROGRADE CHOLANGIOPANCREATOGRAPHY (ERCP) WITH PROPOFOL ;  Surgeon: Teressa Toribio SQUIBB, MD;  Location: [facility];  Service: Endoscopy;  Laterality: N/A;   LEFT HEART CATH AND CORS/GRAFTS ANGIOGRAPHY N/A 08/12/2022   Procedure: LEFT HEART CATH AND CORS/GRAFTS ANGIOGRAPHY;  Surgeon: Verlin Lonni BIRCH, MD;  Location: [facility];  Service: Cardiovascular;  Laterality: N/A;   REMOVAL OF STONES  07/16/2021   Procedure: REMOVAL OF STONES;  Surgeon: Teressa Toribio SQUIBB, MD;  Location: [facility];  Service: Endoscopy;;   SKIN CANCER EXCISION  12/2020   Squamous Cell Cancer   SPHINCTEROTOMY  07/16/2021   Procedure: SPHINCTEROTOMY;  Surgeon: Teressa Toribio SQUIBB, MD;  Location: [facility];  Service: Endoscopy;;   TIBIA FRACTURE SURGERY      History reviewed. No pertinent family history. Social History   Socioeconomic History   Marital status: Married    Spouse name: Not on file   Number of children: Not on file   Years of education: Not on file   Highest education level: Not on file  Occupational History   Not on file  Tobacco Use   Smoking status: Former    Current packs/day: 0.00    Average packs/day: 1 pack/day for 20.0 years (20.0 ttl pk-yrs)    Types: Cigarettes    Start date: 10/19/1955    Quit date: 10/19/1975    Years since quitting: 48.9   Smokeless tobacco: Never   Tobacco comments:    Former smoker 11/11/22  Substance and Sexual Activity   Alcohol use: No   Drug use: No   Sexual activity: Not on file  Other Topics Concern   Not on file  Social History Narrative   Not on file   Social Drivers of Health   Financial Resource Strain: Low Risk  (02/23/2024)   Overall Financial Resource Strain (CARDIA)    Difficulty of Paying Living Expenses: Not hard at all  Food Insecurity: No Food Insecurity (05/25/2024)   Hunger Vital Sign    Worried About Running Out of Food in the Last Year:  Never true    Ran Out of Food in the Last Year: Never true  Transportation Needs: No Transportation Needs (05/25/2024)   PRAPARE - Administrator, Civil Service (Medical): No    Lack of Transportation (Non-Medical): No  Physical Activity: Insufficiently Active (02/23/2024)   Exercise Vital Sign    Days of Exercise per Week: 3 days    Minutes of Exercise per Session: 20 min  Stress: No Stress Concern Present (05/25/2024)   [employer] of Occupational Health - Occupational Stress Questionnaire    Feeling of Stress: Not at all  Social Connections: Socially Integrated (05/25/2024)   Social Connection and Isolation Panel    Frequency of Communication with Friends and Family: More than three times a week    Frequency of Social Gatherings with Friends and Family: More than three times a week    Attends Religious Services: More than 4 times per year    Active Member of [employer] or Organizations: Yes    Attends Engineer, Structural: More than 4 times per year    Marital Status: Married    Objective:  BP 124/62   Pulse 89   Temp 97.8 F (36.6 C)   Ht 6' 1 (1.854 m)   Wt 241 lb (109.3 kg)   SpO2 96%   BMI 31.80 kg/m      09/04/2024    7:44 AM 05/25/2024    8:46 AM 02/23/2024    8:24 AM  BP/Weight  Systolic BP 124 124 124  Diastolic BP 62 72 78  Wt. (Lbs) 241 241 248  BMI 31.8 kg/m2 31.8 kg/m2 32.72 kg/m2    Physical Exam Vitals reviewed.  Constitutional:      Appearance: Normal appearance. He is obese.  Neck:     Vascular: No carotid bruit.  Cardiovascular:     Rate and Rhythm: Normal  rate. Rhythm irregular.     Pulses: Normal pulses.     Heart sounds: Normal heart sounds.  Pulmonary:     Effort: Pulmonary effort is normal.     Breath sounds: Normal breath sounds. No wheezing, rhonchi or rales.  Abdominal:     General: Bowel sounds are normal.     Palpations: Abdomen is soft.     Tenderness: There is no abdominal tenderness.  Musculoskeletal:         General: Tenderness (left knee. Discomfort with flexion of knee.) present.  Neurological:     Mental Status: He is alert and oriented to person, place, and time.  Psychiatric:        Mood and Affect: Mood normal.        Behavior: Behavior normal.      Diabetic foot exam was performed with the following findings:   No deformities, ulcerations, or other skin breakdown Normal sensation of 10g monofilament Intact posterior tibialis and dorsalis pedis pulses     Joint Injection/Arthrocentesis  Date/Time: 09/08/2024 4:23 PM  Performed by: Sherre Clapper, MD Authorized by: Sherre Clapper, MD  Indications: pain  Body area: knee Joint: left knee Local anesthesia used: yes  Anesthesia: Local anesthesia used: yes Local Anesthetic: topical anesthetic  Sedation: Patient sedated: no  Needle size: 22 G Ultrasound guidance: no Approach: medial Triamcinolone  amount: 80 mg Lidocaine  1% amount: 5 mL Patient tolerance: patient tolerated the procedure well with no immediate complications      Lab Results  Component Value Date   WBC 7.8 09/07/2024   HGB 17.7 09/07/2024   HCT 53.0 (H) 09/07/2024   PLT 153 09/07/2024   GLUCOSE 171 (H) 09/07/2024   CHOL 105 09/07/2024   TRIG 104 09/07/2024   HDL 36 (L) 09/07/2024   LDLCALC 49 09/07/2024   ALT 16 09/07/2024   AST 20 09/07/2024   NA 140 09/07/2024   K 5.0 09/07/2024   CL 104 09/07/2024   CREATININE 1.19 09/07/2024   BUN 22 09/07/2024   CO2 21 09/07/2024   TSH 4.160 09/07/2024   HGBA1C 7.4 (H) 09/07/2024    Results for orders placed or performed in visit on 09/04/24  CBC with Differential/Platelet   Collection Time: 09/07/24  8:07 AM  Result Value Ref Range   WBC 7.8 3.4 - 10.8 x10E3/uL   RBC 5.35 4.14 - 5.80 x10E6/uL   Hemoglobin 17.7 13.0 - 17.7 g/dL   Hematocrit 46.9 (H) 62.4 - 51.0 %   MCV 99 (H) 79 - 97 fL   MCH 33.1 (H) 26.6 - 33.0 pg   MCHC 33.4 31.5 - 35.7 g/dL   RDW 87.1 88.3 - 84.5 %   Platelets 153 150 - 450  x10E3/uL   Neutrophils 74 Not Estab. %   Lymphs 18 Not Estab. %   Monocytes 6 Not Estab. %   Eos 1 Not Estab. %   Basos 1 Not Estab. %   Neutrophils Absolute 5.7 1.4 - 7.0 x10E3/uL   Lymphocytes Absolute 1.4 0.7 - 3.1 x10E3/uL   Monocytes Absolute 0.5 0.1 - 0.9 x10E3/uL   EOS (ABSOLUTE) 0.1 0.0 - 0.4 x10E3/uL   Basophils Absolute 0.1 0.0 - 0.2 x10E3/uL   Immature Granulocytes 0 Not Estab. %   Immature Grans (Abs) 0.0 0.0 - 0.1 x10E3/uL  Comprehensive metabolic panel with GFR   Collection Time: 09/07/24  8:07 AM  Result Value Ref Range   Glucose 171 (H) 70 - 99 mg/dL   BUN 22 8 - 27  mg/dL   Creatinine, Ser 8.80 0.76 - 1.27 mg/dL   eGFR 60 >40 fO/fpw/8.26   BUN/Creatinine Ratio 18 10 - 24   Sodium 140 134 - 144 mmol/L   Potassium 5.0 3.5 - 5.2 mmol/L   Chloride 104 96 - 106 mmol/L   CO2 21 20 - 29 mmol/L   Calcium  9.8 8.6 - 10.2 mg/dL   Total Protein 6.8 6.0 - 8.5 g/dL   Albumin 4.4 3.7 - 4.7 g/dL   Globulin, Total 2.4 1.5 - 4.5 g/dL   Bilirubin Total 1.0 0.0 - 1.2 mg/dL   Alkaline Phosphatase 122 48 - 129 IU/L   AST 20 0 - 40 IU/L   ALT 16 0 - 44 IU/L  Hemoglobin A1c   Collection Time: 09/07/24  8:07 AM  Result Value Ref Range   Hgb A1c MFr Bld 7.4 (H) 4.8 - 5.6 %   Est. average glucose Bld gHb Est-mCnc 166 mg/dL  Lipid panel   Collection Time: 09/07/24  8:07 AM  Result Value Ref Range   Cholesterol, Total 105 100 - 199 mg/dL   Triglycerides 895 0 - 149 mg/dL   HDL 36 (L) >60 mg/dL   VLDL Cholesterol Cal 20 5 - 40 mg/dL   LDL Chol Calc (NIH) 49 0 - 99 mg/dL   Chol/HDL Ratio 2.9 0.0 - 5.0 ratio  TSH   Collection Time: 09/07/24  8:07 AM  Result Value Ref Range   TSH 4.160 0.450 - 4.500 uIU/mL  .  Assessment & Plan:   Assessment & Plan Type 2 diabetes mellitus with hyperglycemia, with long-term current use of insulin  (HCC) Diabetes improved. Continue: Farxiga  10mg  daily and NovoLog  70/30 (60 units in the morning and 45 units before supper). Patient also takes  Gabapentin  300mg  twice daily for neuropathy. -Continue current medication regimen. -Check HbA1c today. Orders:   CBC with Differential/Platelet   Comprehensive metabolic panel with GFR   Hemoglobin A1c   TSH   Hypertensive heart disease without congestive heart failure Stable. No antihypertensives.  Continue farxiga  10 mg daily.  Continue plavix . Orders:   Lipid panel   Paroxysmal atrial fibrillation (HCC) On eliquis  5 mg twice daily.  Management per specialist.       Primary osteoarthritis of left knee - knee injection successfully given without complications.  Orders:   triamcinolone  acetonide (KENALOG-40) injection 80 mg  Mixed hyperlipidemia Well controlled.  No changes to medicines. Atorvastatin  20 mg daily, zetia  10 mg daily Continue to work on eating a healthy diet and exercise.  Check lipids       Body mass index is 31.8 kg/m.    Meds ordered this encounter  Medications   triamcinolone  cream (KENALOG) 0.1 %    Sig: Apply 1 Application topically 2 (two) times daily.    Dispense:  453.6 g    Refill:  0   triamcinolone  acetonide (KENALOG-40) injection 80 mg    Orders Placed This Encounter  Procedures   Joint Injection/Arthrocentesis   CBC with Differential/Platelet   Comprehensive metabolic panel with GFR   Hemoglobin A1c   Lipid panel   TSH       Follow-up: Return in about 3 months (around 12/05/2024) for chronic follow up.  An After Visit Summary was printed and given to the patient.  Abigail Free, MD [facility] [phone]

## 2024-09-04 NOTE — Assessment & Plan Note (Addendum)
 Diabetes improved. Continue: Farxiga  10mg  daily and NovoLog  70/30 (60 units in the morning and 45 units before supper). Patient also takes Gabapentin  300mg  twice daily for neuropathy. -Continue current medication regimen. -Check HbA1c today. Orders:   CBC with Differential/Platelet   Comprehensive metabolic panel with GFR   Hemoglobin A1c   TSH

## 2024-09-04 NOTE — Assessment & Plan Note (Addendum)
 Stable. No antihypertensives.  Continue farxiga  10 mg daily.  Continue plavix . Orders:   Lipid panel

## 2024-09-04 NOTE — Assessment & Plan Note (Addendum)
 On eliquis  5 mg twice daily.  Management per specialist.

## 2024-09-05 ENCOUNTER — Ambulatory Visit: Admitting: Family Medicine

## 2024-09-05 ENCOUNTER — Other Ambulatory Visit

## 2024-09-07 DIAGNOSIS — Z794 Long term (current) use of insulin: Secondary | ICD-10-CM | POA: Diagnosis not present

## 2024-09-07 DIAGNOSIS — I119 Hypertensive heart disease without heart failure: Secondary | ICD-10-CM | POA: Diagnosis not present

## 2024-09-08 ENCOUNTER — Encounter: Payer: Self-pay | Admitting: Family Medicine

## 2024-09-08 ENCOUNTER — Ambulatory Visit: Payer: Self-pay | Admitting: Family Medicine

## 2024-09-08 DIAGNOSIS — M1712 Unilateral primary osteoarthritis, left knee: Secondary | ICD-10-CM | POA: Insufficient documentation

## 2024-09-08 DIAGNOSIS — E782 Mixed hyperlipidemia: Secondary | ICD-10-CM | POA: Diagnosis not present

## 2024-09-08 DIAGNOSIS — Z794 Long term (current) use of insulin: Secondary | ICD-10-CM | POA: Diagnosis not present

## 2024-09-08 DIAGNOSIS — E1165 Type 2 diabetes mellitus with hyperglycemia: Secondary | ICD-10-CM | POA: Diagnosis not present

## 2024-09-08 DIAGNOSIS — I480 Paroxysmal atrial fibrillation: Secondary | ICD-10-CM | POA: Diagnosis not present

## 2024-09-08 DIAGNOSIS — I119 Hypertensive heart disease without heart failure: Secondary | ICD-10-CM | POA: Diagnosis not present

## 2024-09-08 LAB — COMPREHENSIVE METABOLIC PANEL WITH GFR
ALT: 16 IU/L (ref 0–44)
AST: 20 IU/L (ref 0–40)
Albumin: 4.4 g/dL (ref 3.7–4.7)
Alkaline Phosphatase: 122 IU/L (ref 48–129)
BUN/Creatinine Ratio: 18 (ref 10–24)
BUN: 22 mg/dL (ref 8–27)
Bilirubin Total: 1 mg/dL (ref 0.0–1.2)
CO2: 21 mmol/L (ref 20–29)
Calcium: 9.8 mg/dL (ref 8.6–10.2)
Chloride: 104 mmol/L (ref 96–106)
Creatinine, Ser: 1.19 mg/dL (ref 0.76–1.27)
Globulin, Total: 2.4 g/dL (ref 1.5–4.5)
Glucose: 171 mg/dL — ABNORMAL HIGH (ref 70–99)
Potassium: 5 mmol/L (ref 3.5–5.2)
Sodium: 140 mmol/L (ref 134–144)
Total Protein: 6.8 g/dL (ref 6.0–8.5)
eGFR: 60 mL/min/1.73 (ref >59)

## 2024-09-08 LAB — CBC WITH DIFFERENTIAL/PLATELET
Basophils Absolute: 0.1 x10E3/uL (ref 0.0–0.2)
Basos: 1 %
EOS (ABSOLUTE): 0.1 x10E3/uL (ref 0.0–0.4)
Eos: 1 %
Hematocrit: 53 % — ABNORMAL HIGH (ref 37.5–51.0)
Hemoglobin: 17.7 g/dL (ref 13.0–17.7)
Immature Grans (Abs): 0 x10E3/uL (ref 0.0–0.1)
Immature Granulocytes: 0 %
Lymphocytes Absolute: 1.4 x10E3/uL (ref 0.7–3.1)
Lymphs: 18 %
MCH: 33.1 pg — ABNORMAL HIGH (ref 26.6–33.0)
MCHC: 33.4 g/dL (ref 31.5–35.7)
MCV: 99 fL — ABNORMAL HIGH (ref 79–97)
Monocytes Absolute: 0.5 x10E3/uL (ref 0.1–0.9)
Monocytes: 6 %
Neutrophils Absolute: 5.7 x10E3/uL (ref 1.4–7.0)
Neutrophils: 74 %
Platelets: 153 x10E3/uL (ref 150–450)
RBC: 5.35 x10E6/uL (ref 4.14–5.80)
RDW: 12.8 % (ref 11.6–15.4)
WBC: 7.8 x10E3/uL (ref 3.4–10.8)

## 2024-09-08 LAB — TSH: TSH: 4.16 u[IU]/mL (ref 0.450–4.500)

## 2024-09-08 LAB — LIPID PANEL
Chol/HDL Ratio: 2.9 ratio (ref 0.0–5.0)
Cholesterol, Total: 105 mg/dL (ref 100–199)
HDL: 36 mg/dL — ABNORMAL LOW (ref >39)
LDL Chol Calc (NIH): 49 mg/dL (ref 0–99)
Triglycerides: 104 mg/dL (ref 0–149)
VLDL Cholesterol Cal: 20 mg/dL (ref 5–40)

## 2024-09-08 LAB — HEMOGLOBIN A1C
Est. average glucose Bld gHb Est-mCnc: 166 mg/dL
Hgb A1c MFr Bld: 7.4 % — ABNORMAL HIGH (ref 4.8–5.6)

## 2024-09-08 NOTE — Assessment & Plan Note (Signed)
-   knee injection successfully given without complications.  Orders:   triamcinolone  acetonide (KENALOG-40) injection 80 mg

## 2024-09-08 NOTE — Assessment & Plan Note (Signed)
 Well controlled.  No changes to medicines. Atorvastatin  20 mg daily, zetia  10 mg daily Continue to work on eating a healthy diet and exercise.  Check lipids

## 2024-09-11 ENCOUNTER — Ambulatory Visit (INDEPENDENT_AMBULATORY_CARE_PROVIDER_SITE_OTHER)

## 2024-09-11 DIAGNOSIS — Z23 Encounter for immunization: Secondary | ICD-10-CM

## 2024-09-19 NOTE — Progress Notes (Signed)
 Acute Office Visit  Subjective:    Patient ID: Andre Shaffer, male    DOB: September 07, 1941, 83 y.o.   MRN: [id number]  Chief Complaint  Patient presents with   Knee Pain    Right knee    HPI: Patient is in today for right knee osteoarthritis and arthrocentesis.   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    Past Surgical History:  Procedure Laterality Date   ANKLE SURGERY  93,03   rt and lt    CARDIOVERSION N/A 10/20/2022   Procedure: CARDIOVERSION;  Surgeon: Delford Maude BROCKS, MD;  Location: [facility];  Service: Cardiovascular;  Laterality: N/A;   CHOLECYSTECTOMY  10/19/2012   Procedure: LAPAROSCOPIC CHOLECYSTECTOMY WITH INTRAOPERATIVE CHOLANGIOGRAM;  Surgeon: Lynda Leos, MD;  Location: [facility];  Service: General;  Laterality: N/A;   COLONOSCOPY     CORONARY ARTERY BYPASS GRAFT  1997   CORONARY STENT INTERVENTION N/A 08/12/2022   Procedure: CORONARY STENT INTERVENTION;  Surgeon: Verlin Lonni BIRCH, MD;  Location: [facility];  Service: Cardiovascular;  Laterality: N/A;   ENDOSCOPIC RETROGRADE CHOLANGIOPANCREATOGRAPHY (ERCP) WITH PROPOFOL  N/A 07/16/2021   Procedure: ENDOSCOPIC RETROGRADE CHOLANGIOPANCREATOGRAPHY (ERCP) WITH PROPOFOL ;  Surgeon: Teressa Toribio SQUIBB, MD;  Location: [facility];  Service: Endoscopy;  Laterality: N/A;   LEFT HEART CATH AND CORS/GRAFTS ANGIOGRAPHY  N/A 08/12/2022   Procedure: LEFT HEART CATH AND CORS/GRAFTS ANGIOGRAPHY;  Surgeon: Verlin Lonni BIRCH, MD;  Location: [facility];  Service: Cardiovascular;  Laterality: N/A;   REMOVAL OF STONES  07/16/2021   Procedure: REMOVAL OF STONES;  Surgeon: Teressa Toribio SQUIBB, MD;  Location: [facility];  Service: Endoscopy;;   SKIN CANCER EXCISION  12/2020   Squamous Cell Cancer   SPHINCTEROTOMY  07/16/2021   Procedure: SPHINCTEROTOMY;  Surgeon: Teressa Toribio SQUIBB, MD;  Location: [facility];  Service: Endoscopy;;   TIBIA FRACTURE SURGERY      History reviewed. No pertinent family history.  Social History   Socioeconomic History   Marital status: Married    Spouse name: Not on file   Number of children: Not on file   Years of education: Not on file   Highest education level: Not on file  Occupational History   Not on file  Tobacco Use   Smoking status: Former    Current packs/day: 0.00    Average packs/day: 1 pack/day for 20.0 years (20.0 ttl pk-yrs)    Types: Cigarettes    Start date: 10/19/1955    Quit date: 10/19/1975    Years since quitting: 48.9   Smokeless tobacco: Never   Tobacco comments:    Former smoker 11/11/22  Substance and Sexual Activity   Alcohol use: No   Drug use: No   Sexual activity: Not on file  Other Topics Concern   Not on file  Social History Narrative   Not on file   Social Drivers of Health   Financial Resource Strain: Low Risk  (02/23/2024)   Overall Financial Resource Strain (CARDIA)    Difficulty of Paying Living Expenses: Not hard at all  Food Insecurity: No Food Insecurity (05/25/2024)   Hunger Vital Sign    Worried About Running Out of Food in the Last Year: Never true    Ran Out of Food in the Last Year: Never true  Transportation Needs: No Transportation Needs (05/25/2024)   PRAPARE - Administrator, Civil Service (Medical): No    Lack of Transportation (Non-Medical): No  Physical Activity: Insufficiently Active  (02/23/2024)   Exercise Vital Sign    Days of Exercise per Week: 3 days    Minutes of Exercise per Session: 20 min  Stress: No Stress Concern Present (05/25/2024)   [employer] of Occupational Health - Occupational Stress Questionnaire    Feeling of Stress: Not at all  Social Connections: Socially Integrated (05/25/2024)   Social Connection and Isolation Panel    Frequency of Communication with Friends and Family: More than three times a week    Frequency of Social Gatherings with Friends and Family: More than three times a week    Attends Religious Services: More than 4 times per year    Active Member of [employer] or Organizations: Yes    Attends Engineer, Structural: More than 4 times per year    Marital Status: Married  Catering Manager Violence: Not At Risk (05/25/2024)   Humiliation, Afraid, Rape, and Kick questionnaire    Fear of Current or Ex-Partner: No    Emotionally Abused: No    Physically Abused: No    Sexually Abused: No    Outpatient Medications Prior to Visit  Medication Sig Dispense Refill   acetaminophen  (TYLENOL ) 500 MG tablet Take 500-1,000 mg by mouth every 6 (six) hours as needed (pain.).     apixaban  (ELIQUIS ) 5 MG TABS tablet TAKE 1 TABLET BY MOUTH TWICE DAILY. 60 tablet 5   atorvastatin  (LIPITOR) 20 MG tablet Take 1 tablet (20 mg total) by mouth daily. 90 tablet 1   Blood Glucose Monitoring Suppl (ONETOUCH VERIO) w/Device KIT Use as instructed. Check blood glucose twice daily. E11.42 1 kit 0   clopidogrel  (PLAVIX ) 75 MG tablet TAKE 1 TABLET BY MOUTH EVERY DAY WITH BREAKFAST 30 tablet 6   Cyanocobalamin 1000 MCG/ML LIQD Place 2,500 mcg under the tongue in the morning. Place 0.5 dropperful (2,500 mcg) sublingually daily     ezetimibe  (ZETIA ) 10 MG tablet TAKE ONE TABLET BY MOUTH ONCE DAILY 90 tablet 0   FARXIGA  10 MG TABS tablet TAKE 1 TABLET BY MOUTH DAILY. 90 tablet 0   gabapentin  (NEURONTIN ) 300 MG capsule Take 1 capsule (300 mg total) by mouth 2 (two)  times daily. 180 capsule 2   GLOBAL EASE INJECT PEN NEEDLES 31G X 8 MM MISC USE TO INJECT INTO THE SKIN TWICE A DAY 100 each 1   glucose blood test strip      insulin  aspart protamine- aspart (NOVOLOG  MIX 70/30) (70-30) 100 UNIT/ML injection Inject 0.4-0.6 mLs (40-60 Units total) into the skin See admin instructions. Inject 60 units into the skin before breakfast and 40 units units before supper/evening meal 10 mL 0   Insulin  Pen Needle (NOVOFINE) 30G X 8 MM MISC Inject 10 each into the skin as needed. E11.42 100 each 2   Lancets (ONETOUCH ULTRASOFT) lancets Use as instructed,  Check blood glucose twice daily. 100 each 12   Multiple Vitamins-Minerals (PRESERVISION AREDS PO) Take 1 capsule by mouth 2 (two) times daily.     ONETOUCH VERIO test strip CHECK BLOOD SUGAR TWICE DAILY. 100 each 10   triamcinolone  cream (KENALOG ) 0.1 % Apply 1 Application topically 2 (two) times daily. 453.6 g 0   Facility-Administered Medications Prior to Visit  Medication Dose Route Frequency Provider Last Rate Last Admin   Insulin  Pen Needle (NOVOFINE) 10 each  1 packet Subcutaneous PRN Rmani Kellogg, MD       triamcinolone  acetonide (KENALOG -40) injection 80 mg  80 mg Intra-articular Once         Allergies  Allergen Reactions   Nitroglycerin Other (See Comments)    Blood pressure 'bottomed out'   Statins Other (See Comments)    Elevation of liver enzymes   Tetanus Toxoid Other (See Comments)    Severe flu symptoms fever and chills   Penicillins Rash    Review of Systems     Objective:        09/20/2024    7:35 AM 09/04/2024    7:44 AM 05/25/2024    8:46 AM  Vitals with BMI  Height 6' 1 6' 1 6' 1  Weight 242 lbs 241 lbs 241 lbs  BMI 31.93 31.8 31.8  Systolic 128 124 875  Diastolic 74 62 72  Pulse 85 89 49    No data found.   Physical Exam Vitals reviewed.  Constitutional:      Appearance: Normal appearance.  Musculoskeletal:        General: Tenderness (right knee) present.   Neurological:     Mental Status: He is alert.   Joint Injection/Arthrocentesis  Date/Time: 09/23/2024 12:38 PM  Performed by: Sherre Clapper, MD Authorized by: Sherre Clapper, MD  Indications: pain  Body area: knee Local anesthesia used: yes  Anesthesia: Local anesthesia used: yes Local Anesthetic: topical anesthetic  Sedation: Patient sedated: no  Needle size: 22 G Ultrasound guidance: no Approach: medial Triamcinolone  amount: 80 mg Lidocaine  1% amount: 5 mL Patient tolerance: patient tolerated the procedure well with no immediate complications      Health Maintenance Due  Topic Date Due   COVID-19 Vaccine (5 - 2025-26 season) 07/02/2024    There are no preventive care reminders to display for this patient.   Lab Results  Component Value Date   TSH 4.160 09/07/2024   Lab Results  Component Value Date   WBC 7.8 09/07/2024   HGB 17.7 09/07/2024   HCT 53.0 (H) 09/07/2024   MCV 99 (H) 09/07/2024   PLT 153 09/07/2024   Lab Results  Component Value Date   NA 140 09/07/2024   K 5.0 09/07/2024   CO2 21 09/07/2024   GLUCOSE 171 (H) 09/07/2024   BUN 22 09/07/2024   CREATININE 1.19 09/07/2024   BILITOT 1.0 09/07/2024   ALKPHOS 122 09/07/2024   AST 20 09/07/2024   ALT 16 09/07/2024   PROT 6.8 09/07/2024   ALBUMIN 4.4 09/07/2024   CALCIUM  9.8 09/07/2024   ANIONGAP 8 08/13/2022   EGFR 60 09/07/2024   Lab Results  Component Value Date   CHOL 105 09/07/2024   Lab Results  Component Value Date   HDL 36 (L) 09/07/2024   Lab Results  Component Value Date   LDLCALC 49 09/07/2024   Lab Results  Component Value Date   TRIG 104 09/07/2024   Lab Results  Component Value Date   CHOLHDL 2.9 09/07/2024  Lab Results  Component Value Date   HGBA1C 7.4 (H) 09/07/2024        Results for orders placed or performed in visit on 09/04/24  CBC with Differential/Platelet   Collection Time: 09/07/24  8:07 AM  Result Value Ref Range   WBC 7.8 3.4 - 10.8  x10E3/uL   RBC 5.35 4.14 - 5.80 x10E6/uL   Hemoglobin 17.7 13.0 - 17.7 g/dL   Hematocrit 46.9 (H) 62.4 - 51.0 %   MCV 99 (H) 79 - 97 fL   MCH 33.1 (H) 26.6 - 33.0 pg   MCHC 33.4 31.5 - 35.7 g/dL   RDW 87.1 88.3 - 84.5 %   Platelets 153 150 - 450 x10E3/uL   Neutrophils 74 Not Estab. %   Lymphs 18 Not Estab. %   Monocytes 6 Not Estab. %   Eos 1 Not Estab. %   Basos 1 Not Estab. %   Neutrophils Absolute 5.7 1.4 - 7.0 x10E3/uL   Lymphocytes Absolute 1.4 0.7 - 3.1 x10E3/uL   Monocytes Absolute 0.5 0.1 - 0.9 x10E3/uL   EOS (ABSOLUTE) 0.1 0.0 - 0.4 x10E3/uL   Basophils Absolute 0.1 0.0 - 0.2 x10E3/uL   Immature Granulocytes 0 Not Estab. %   Immature Grans (Abs) 0.0 0.0 - 0.1 x10E3/uL  Comprehensive metabolic panel with GFR   Collection Time: 09/07/24  8:07 AM  Result Value Ref Range   Glucose 171 (H) 70 - 99 mg/dL   BUN 22 8 - 27 mg/dL   Creatinine, Ser 8.80 0.76 - 1.27 mg/dL   eGFR 60 >40 fO/fpw/8.26   BUN/Creatinine Ratio 18 10 - 24   Sodium 140 134 - 144 mmol/L   Potassium 5.0 3.5 - 5.2 mmol/L   Chloride 104 96 - 106 mmol/L   CO2 21 20 - 29 mmol/L   Calcium  9.8 8.6 - 10.2 mg/dL   Total Protein 6.8 6.0 - 8.5 g/dL   Albumin 4.4 3.7 - 4.7 g/dL   Globulin, Total 2.4 1.5 - 4.5 g/dL   Bilirubin Total 1.0 0.0 - 1.2 mg/dL   Alkaline Phosphatase 122 48 - 129 IU/L   AST 20 0 - 40 IU/L   ALT 16 0 - 44 IU/L  Hemoglobin A1c   Collection Time: 09/07/24  8:07 AM  Result Value Ref Range   Hgb A1c MFr Bld 7.4 (H) 4.8 - 5.6 %   Est. average glucose Bld gHb Est-mCnc 166 mg/dL  Lipid panel   Collection Time: 09/07/24  8:07 AM  Result Value Ref Range   Cholesterol, Total 105 100 - 199 mg/dL   Triglycerides 895 0 - 149 mg/dL   HDL 36 (L) >60 mg/dL   VLDL Cholesterol Cal 20 5 - 40 mg/dL   LDL Chol Calc (NIH) 49 0 - 99 mg/dL   Chol/HDL Ratio 2.9 0.0 - 5.0 ratio  TSH   Collection Time: 09/07/24  8:07 AM  Result Value Ref Range   TSH 4.160 0.450 - 4.500 uIU/mL     Assessment & Plan:    Assessment & Plan Primary osteoarthritis of right knee  Orders:   Joint Injection/Arthrocentesis     Body mass index is 31.93 kg/m.SABRA  Assessment and Plan Assessment & Plan      No orders of the defined types were placed in this encounter.   Orders Placed This Encounter  Procedures   Joint Injection/Arthrocentesis     Follow-up: No follow-ups on file.  An After Visit Summary was printed and given to the patient.  Abigail Free, MD [facility] [phone]

## 2024-09-20 ENCOUNTER — Ambulatory Visit (INDEPENDENT_AMBULATORY_CARE_PROVIDER_SITE_OTHER): Admitting: Family Medicine

## 2024-09-20 VITALS — BP 128/74 | HR 85 | Temp 98.2°F | Ht 73.0 in | Wt 242.0 lb

## 2024-09-20 DIAGNOSIS — M1711 Unilateral primary osteoarthritis, right knee: Secondary | ICD-10-CM

## 2024-09-23 ENCOUNTER — Encounter: Payer: Self-pay | Admitting: Family Medicine

## 2024-09-23 DIAGNOSIS — M1711 Unilateral primary osteoarthritis, right knee: Secondary | ICD-10-CM | POA: Insufficient documentation

## 2024-09-23 NOTE — Assessment & Plan Note (Signed)
  Orders:   Joint Injection/Arthrocentesis

## 2024-10-08 DIAGNOSIS — D485 Neoplasm of uncertain behavior of skin: Secondary | ICD-10-CM | POA: Diagnosis not present

## 2024-10-16 NOTE — Progress Notes (Signed)
° °  10/16/2024  Patient ID: Andre Shaffer, male   DOB: 02-15-1941, 83 y.o.   MRN: [id number]  Pharmacy Quality Measure Review  This patient is appearing on a report for being at risk of failing the adherence measure for cholesterol (statin) medications this calendar year.   Medication: rosuvastatin Last fill date: 09/18/24 for 30 day supply  Insurance report was not up to date. No action needed at this time.   Lang Sieve, PharmD, BCGP Clinical Pharmacist  [phone]

## 2024-11-02 ENCOUNTER — Other Ambulatory Visit: Payer: Self-pay

## 2024-11-02 MED ORDER — ATORVASTATIN CALCIUM 20 MG PO TABS: 20.0000 mg | ORAL_TABLET | Freq: Every day | ORAL | 1 refills | Status: AC

## 2024-11-13 ENCOUNTER — Other Ambulatory Visit: Payer: Self-pay | Admitting: Family Medicine

## 2024-11-13 ENCOUNTER — Other Ambulatory Visit: Payer: Self-pay

## 2024-11-13 DIAGNOSIS — E1165 Type 2 diabetes mellitus with hyperglycemia: Secondary | ICD-10-CM

## 2024-11-13 MED ORDER — EZETIMIBE 10 MG PO TABS: 10.0000 mg | ORAL_TABLET | Freq: Every day | ORAL | 0 refills | Status: AC
